# Patient Record
Sex: Male | Born: 1960 | Race: White | Hispanic: No | Smoking: Former smoker
Health system: Southern US, Community
[De-identification: ages and names within clinical notes are randomized; demographics above are authoritative.]

## PROBLEM LIST (undated history)

## (undated) DIAGNOSIS — Z87891 Personal history of nicotine dependence: Secondary | ICD-10-CM

## (undated) DIAGNOSIS — R7303 Prediabetes: Secondary | ICD-10-CM

## (undated) DIAGNOSIS — Z9289 Personal history of other medical treatment: Secondary | ICD-10-CM

## (undated) DIAGNOSIS — F32A Depression, unspecified: Secondary | ICD-10-CM

## (undated) DIAGNOSIS — Q825 Congenital non-neoplastic nevus: Secondary | ICD-10-CM

## (undated) DIAGNOSIS — I10 Essential (primary) hypertension: Secondary | ICD-10-CM

## (undated) DIAGNOSIS — I861 Scrotal varices: Secondary | ICD-10-CM

## (undated) DIAGNOSIS — H35719 Central serous chorioretinopathy, unspecified eye: Secondary | ICD-10-CM

## (undated) DIAGNOSIS — F329 Major depressive disorder, single episode, unspecified: Secondary | ICD-10-CM

## (undated) DIAGNOSIS — D649 Anemia, unspecified: Secondary | ICD-10-CM

## (undated) DIAGNOSIS — Z87442 Personal history of urinary calculi: Secondary | ICD-10-CM

## (undated) HISTORY — DX: Personal history of nicotine dependence: Z87.891

## (undated) HISTORY — DX: Congenital non-neoplastic nevus: Q82.5

## (undated) HISTORY — DX: Depression, unspecified: F32.A

## (undated) HISTORY — PX: COLONOSCOPY: SHX174

## (undated) HISTORY — PX: HERNIA REPAIR: SHX51

## (undated) HISTORY — DX: Central serous chorioretinopathy, unspecified eye: H35.719

## (undated) HISTORY — DX: Essential (primary) hypertension: I10

## (undated) HISTORY — DX: Major depressive disorder, single episode, unspecified: F32.9

## (undated) HISTORY — DX: Scrotal varices: I86.1

## (undated) HISTORY — PX: EXTRACORPOREAL SHOCK WAVE LITHOTRIPSY: SHX1557

---

## 2003-10-23 ENCOUNTER — Ambulatory Visit (HOSPITAL_COMMUNITY): Admission: RE | Admit: 2003-10-23 | Discharge: 2003-10-23 | Payer: Self-pay | Admitting: Urology

## 2004-10-03 HISTORY — PX: PALATE / UVULA BIOPSY / EXCISION: SUR128

## 2006-08-16 ENCOUNTER — Ambulatory Visit: Payer: Self-pay | Admitting: Family Medicine

## 2006-12-28 ENCOUNTER — Ambulatory Visit: Payer: Self-pay | Admitting: Family Medicine

## 2007-02-02 ENCOUNTER — Ambulatory Visit: Payer: Self-pay | Admitting: Family Medicine

## 2007-07-20 ENCOUNTER — Ambulatory Visit: Payer: Self-pay | Admitting: Family Medicine

## 2008-06-12 ENCOUNTER — Ambulatory Visit: Payer: Self-pay | Admitting: Family Medicine

## 2008-07-11 ENCOUNTER — Ambulatory Visit: Payer: Self-pay | Admitting: Family Medicine

## 2008-08-12 ENCOUNTER — Ambulatory Visit: Payer: Self-pay | Admitting: Family Medicine

## 2009-07-09 ENCOUNTER — Ambulatory Visit: Payer: Self-pay | Admitting: Family Medicine

## 2009-09-17 IMAGING — CR DG KNEE AP/LAT W/ SUNRISE*R*
3 series · 3 of 3 positions shown · non-contrast
Comparison: None

CLINICAL DATA: Knee pain, no injury.

DG KNEE - 3 VIEWS

[t knee ap right]
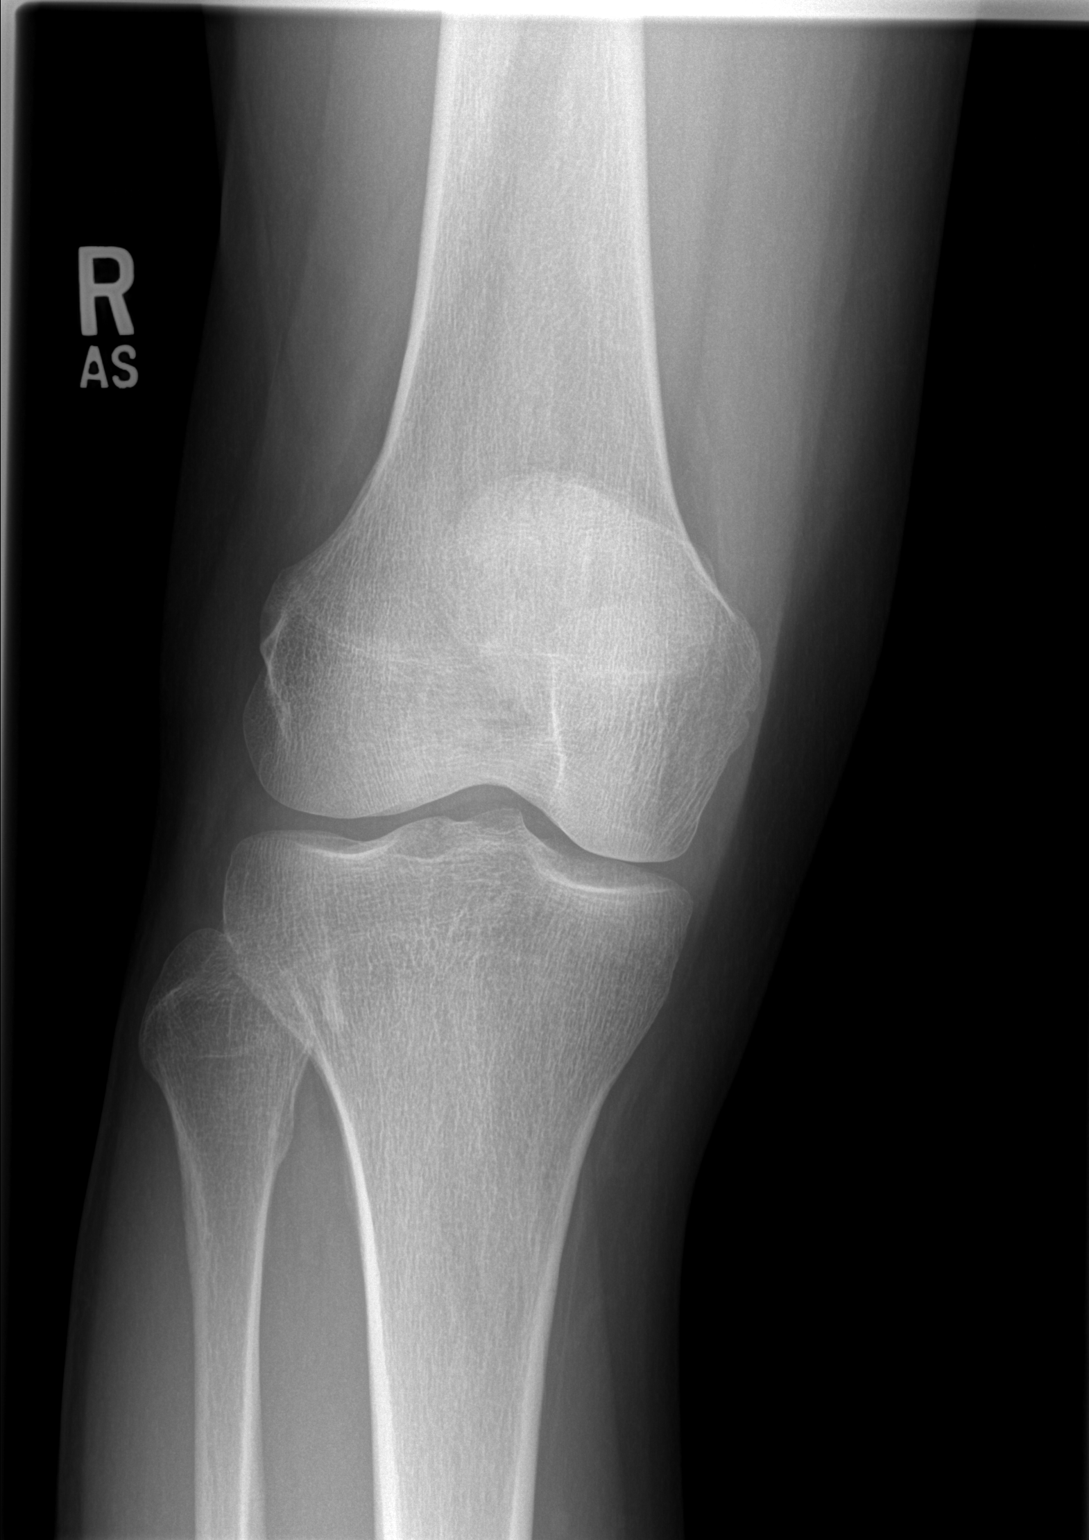

[t knee lat right]
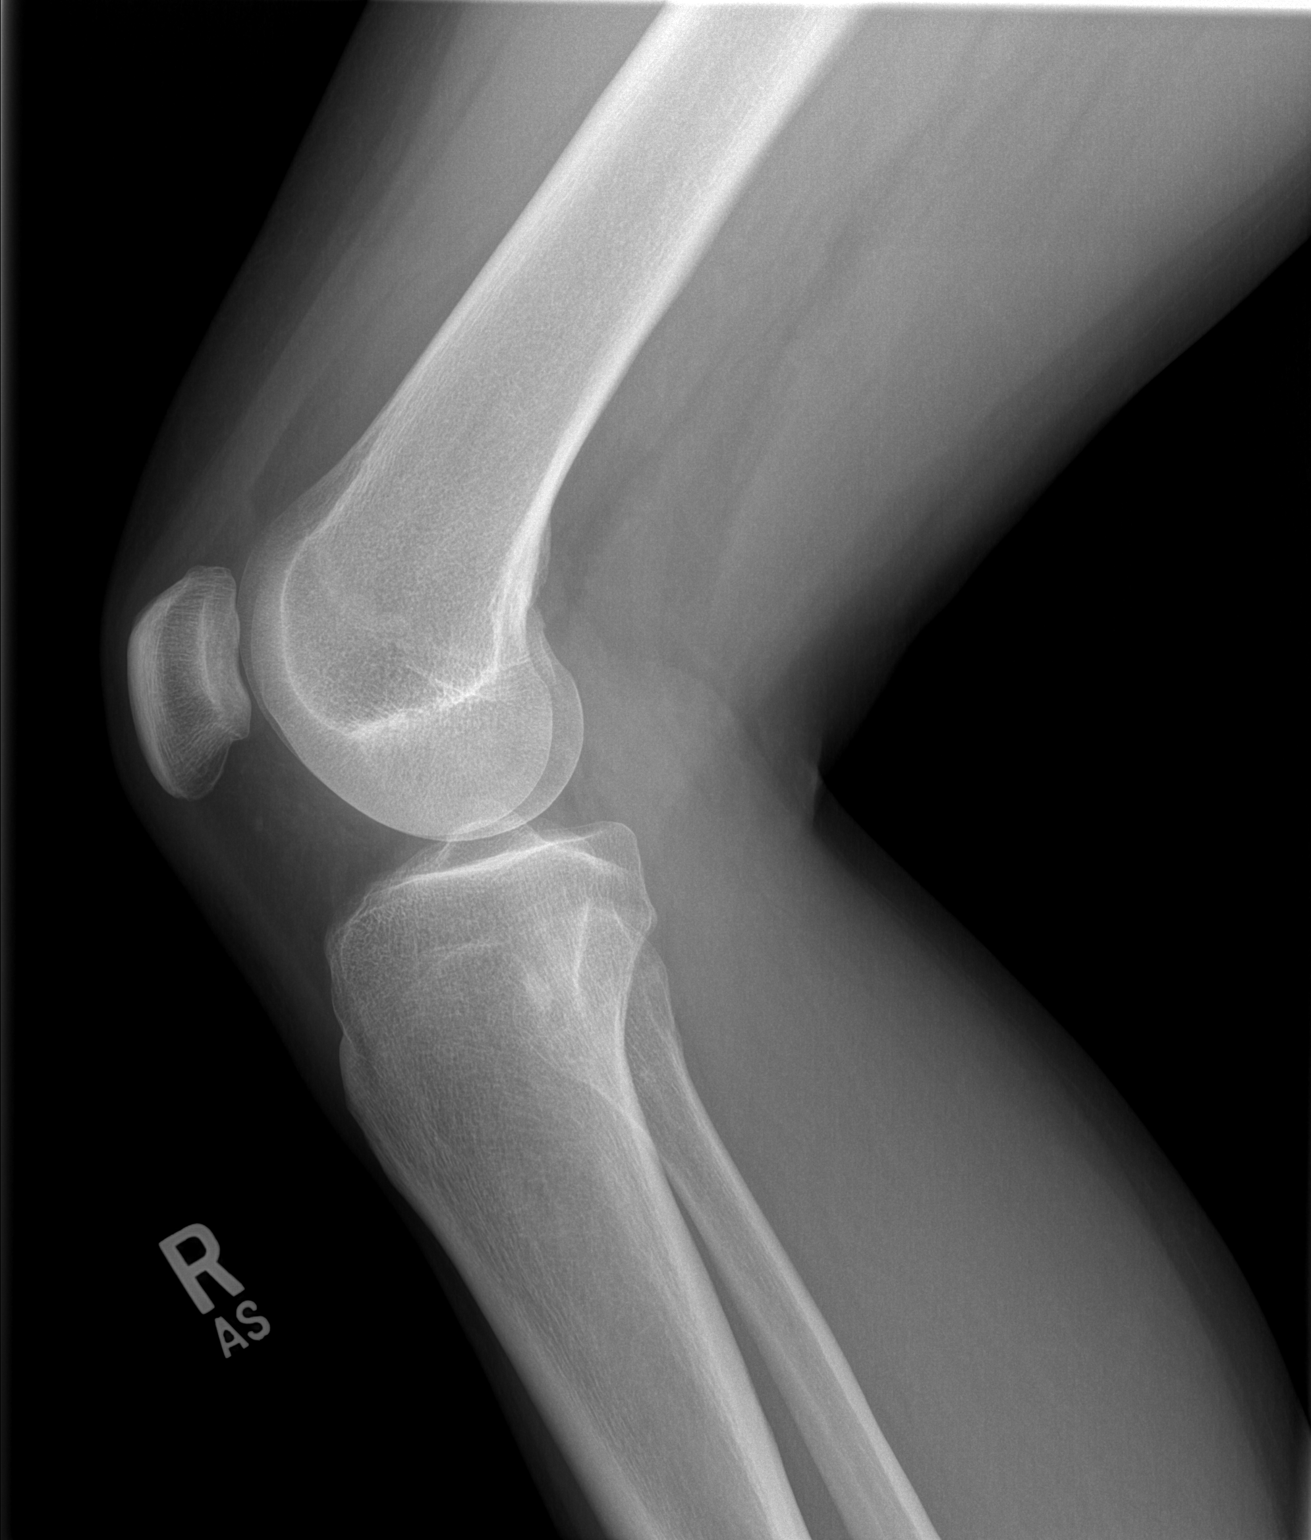

[view not recorded]
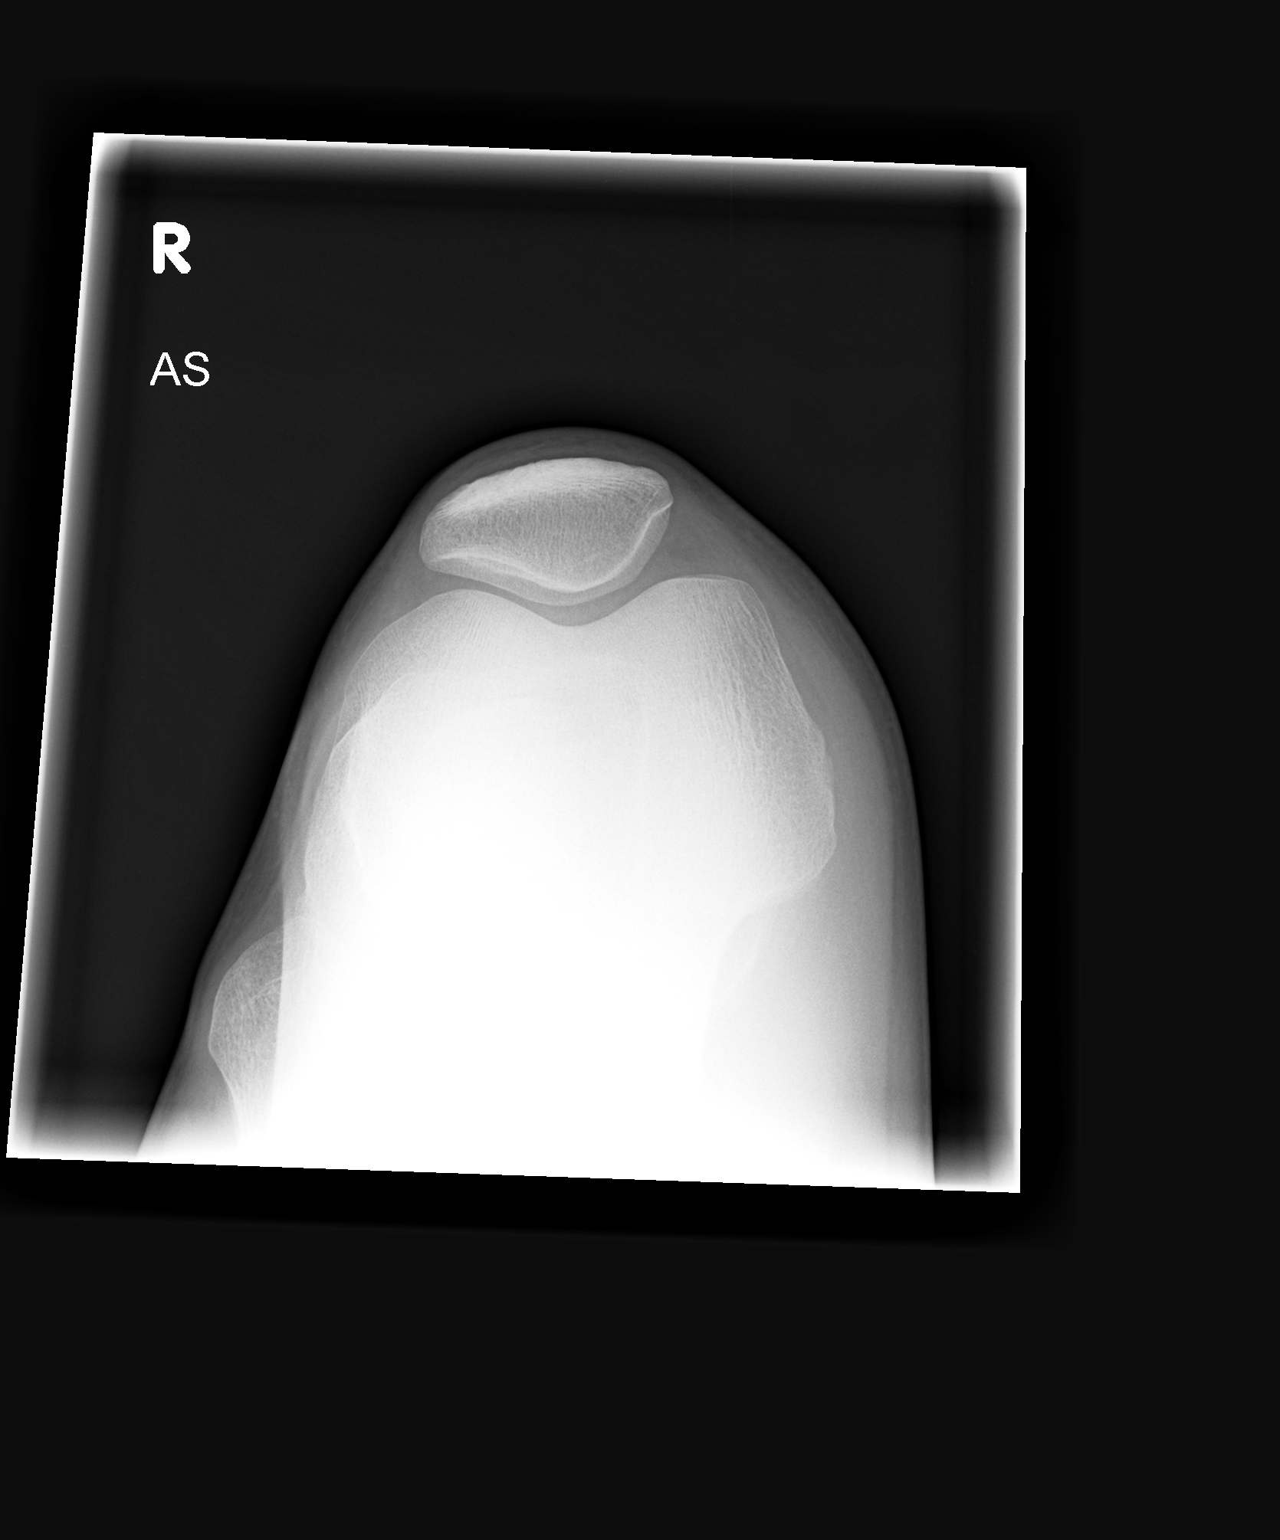

[3 of 3 positions shown; findings below may reference images not displayed]

FINDINGS: No acute bony abnormality.  Specifically, no fracture,
subluxation, or dislocation.  Soft tissues are intact. No joint
effusion.  Joint spaces maintained.
IMPRESSION: Unremarkable study.

## 2010-04-22 ENCOUNTER — Ambulatory Visit: Payer: Self-pay | Admitting: Family Medicine

## 2010-04-23 ENCOUNTER — Encounter: Admission: RE | Admit: 2010-04-23 | Discharge: 2010-04-23 | Payer: Self-pay | Admitting: Family Medicine

## 2010-04-23 IMAGING — CR DG ABDOMEN 1V
2 series · 2 of 2 positions shown · non-contrast
Comparison: None

CLINICAL DATA: Abdominal pain.

ABDOMEN - 1 VIEW

[t abdomen supine (1 of 2)]
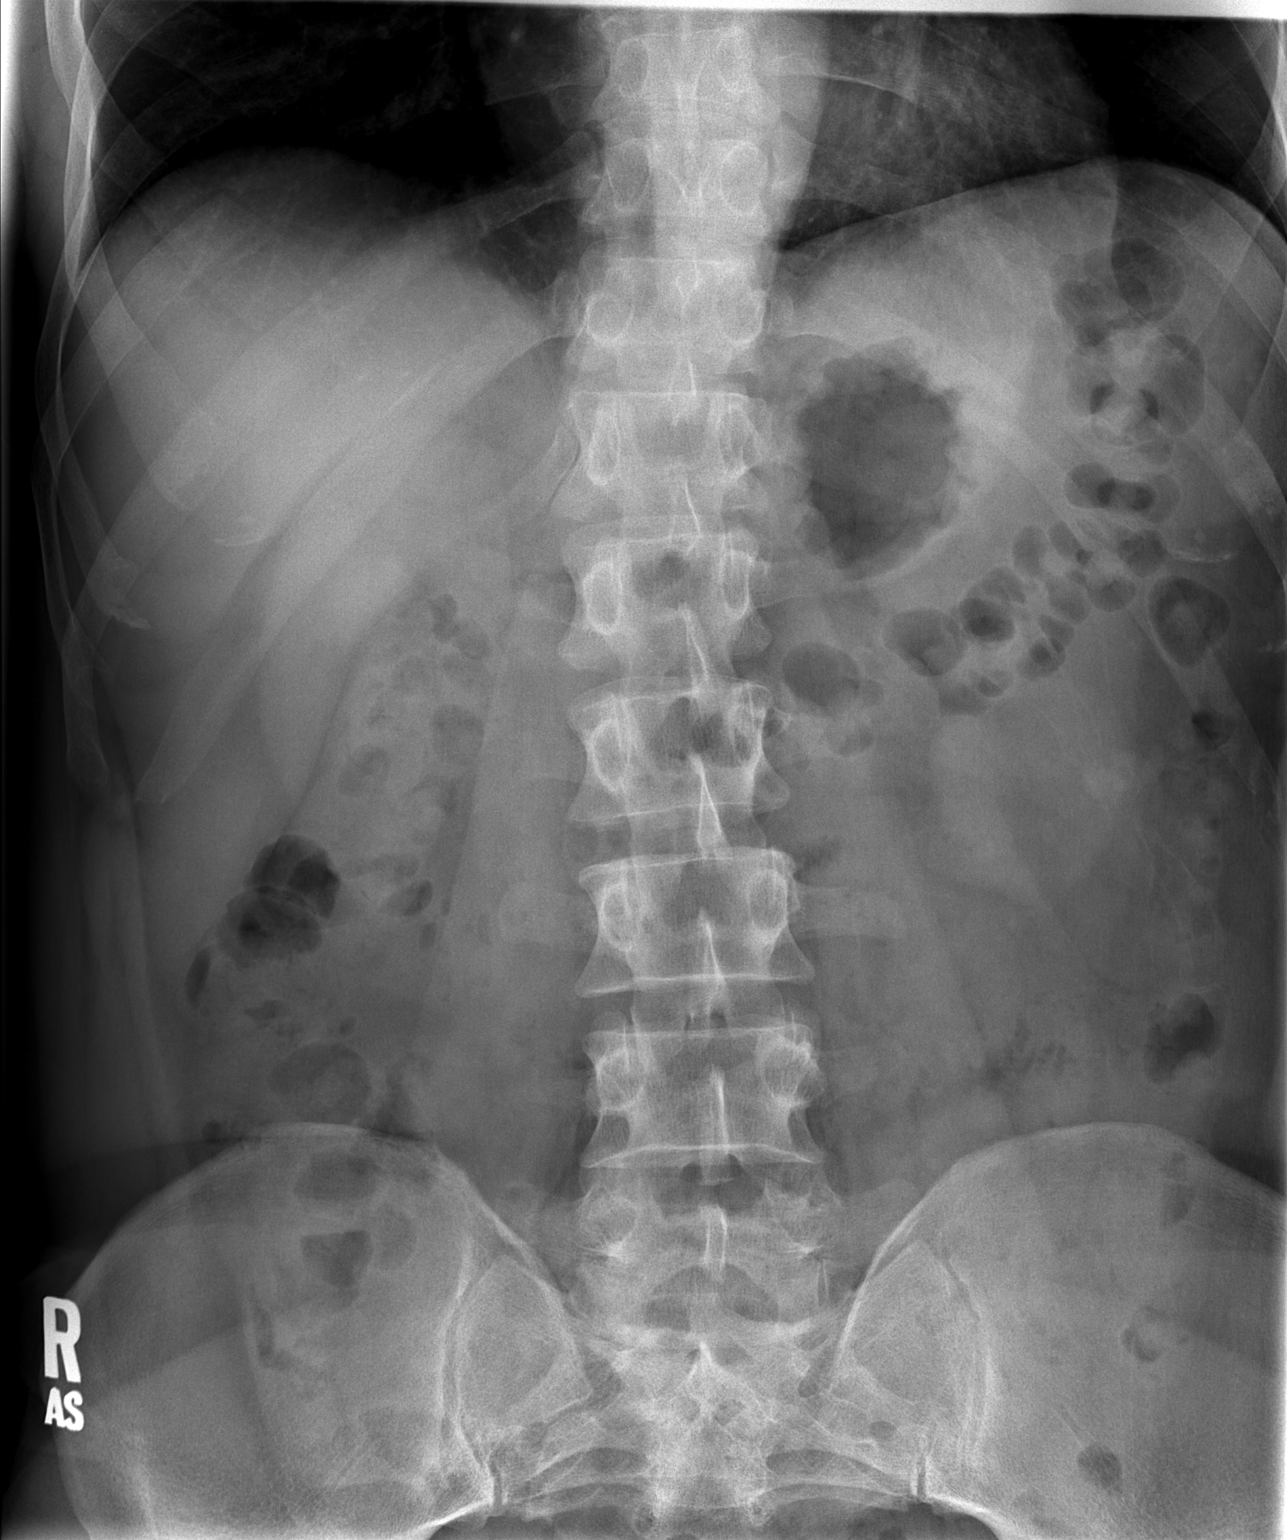

[t abdomen supine (2 of 2)]
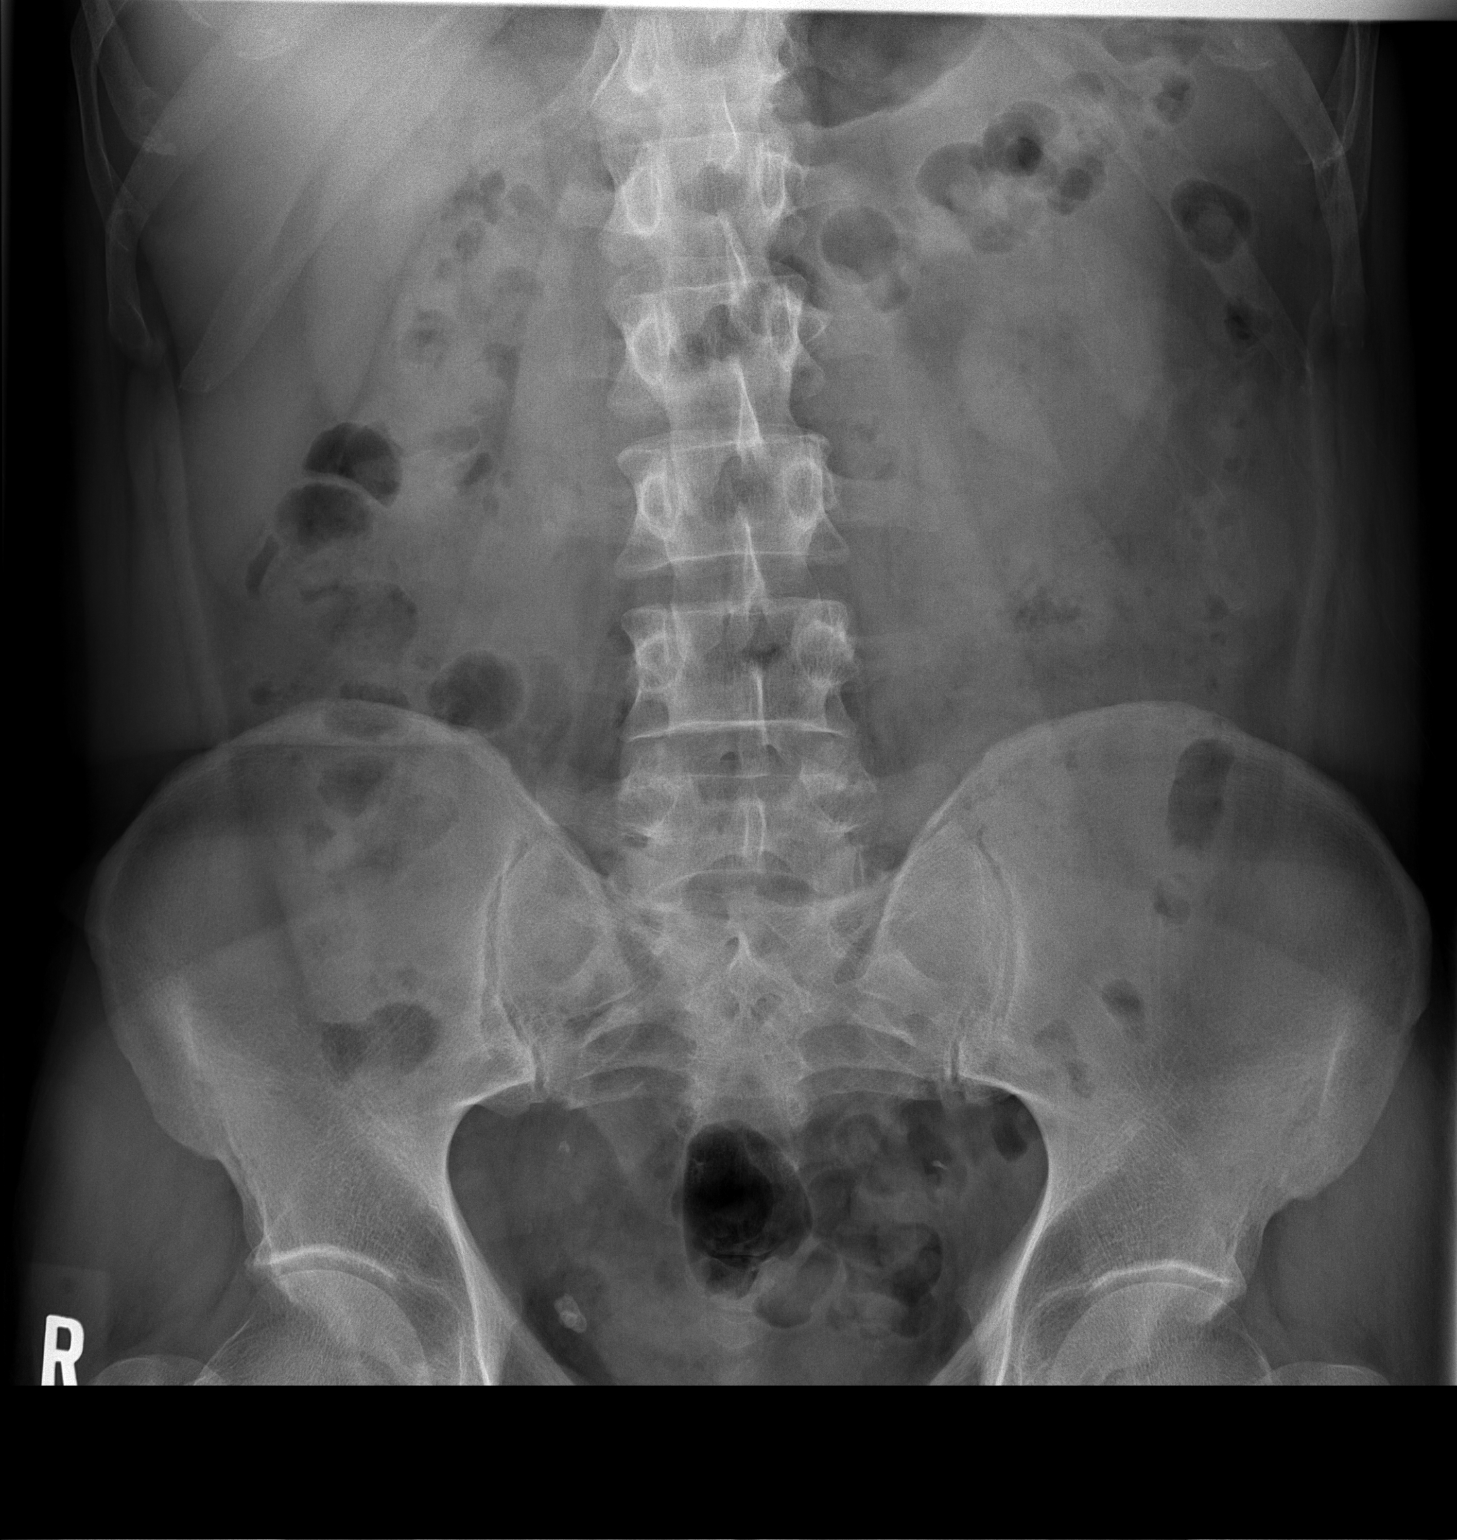

[2 of 2 positions shown; findings below may reference images not displayed]

FINDINGS: There is a normal bowel gas pattern.  No free air.  No
organomegaly or suspicious calcification.  Phleboliths seen in the
right side of the anatomic pelvis.  Visualized bony structures and
lung bases unremarkable.
IMPRESSION: No acute findings.

## 2010-04-23 IMAGING — US US ABDOMEN COMPLETE
1 series · 14 of 25 positions shown · non-contrast
Comparison: None

CLINICAL DATA: Left lower quadrant pain, nausea.

COMPLETE ABDOMINAL ULTRASOUND

[Series 1: us abdomen complete · 0.33mm/px · 14 of 70 slices shown]
[im 1/70]
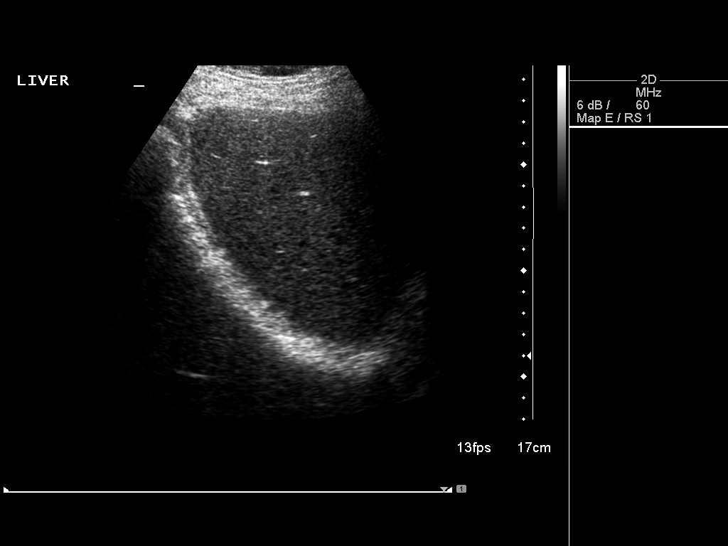
[im 6/70]
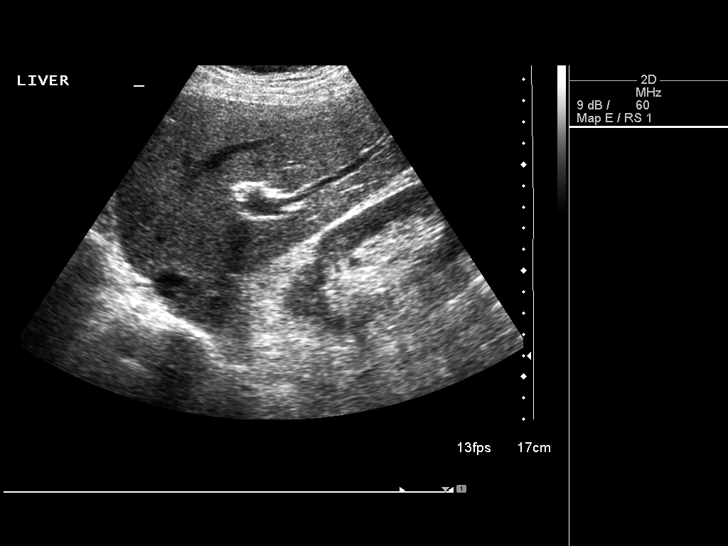
[im 12/70]
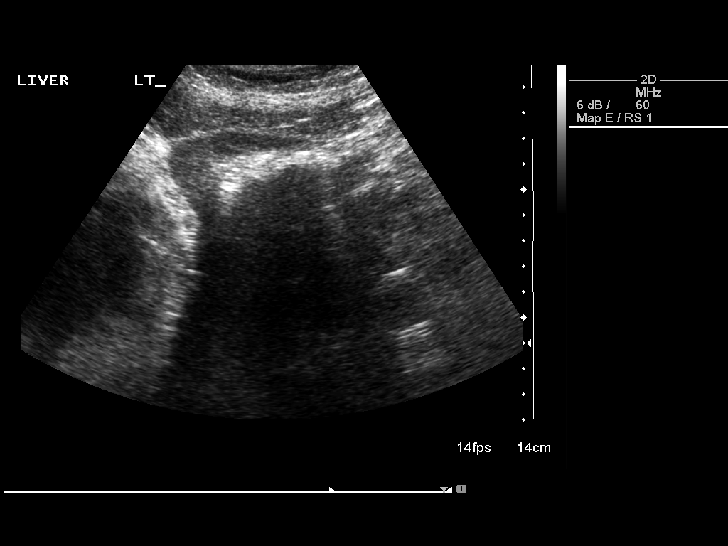
[im 18/70]
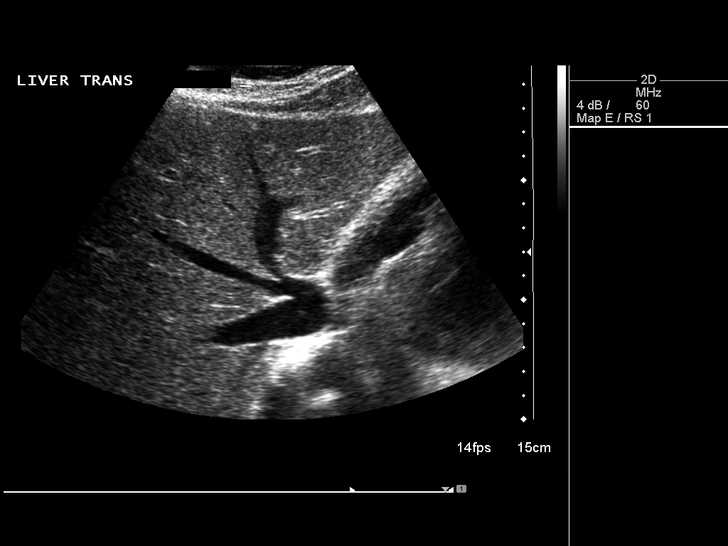
[im 24/70]
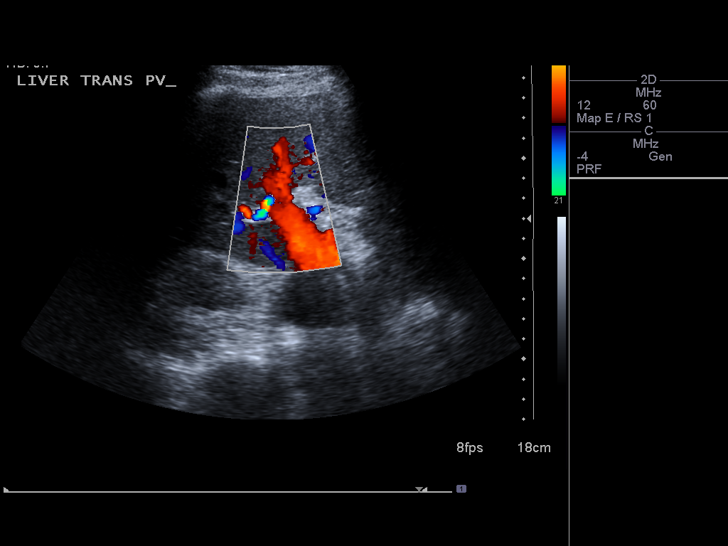
[im 26/70]
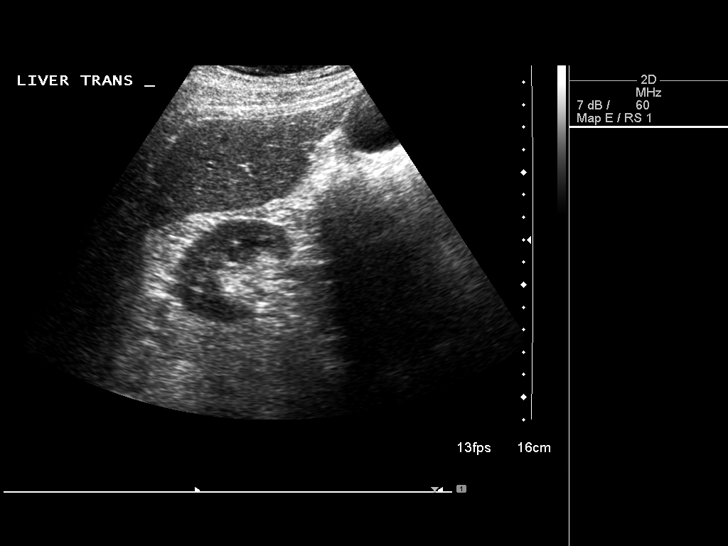
[im 32/70]
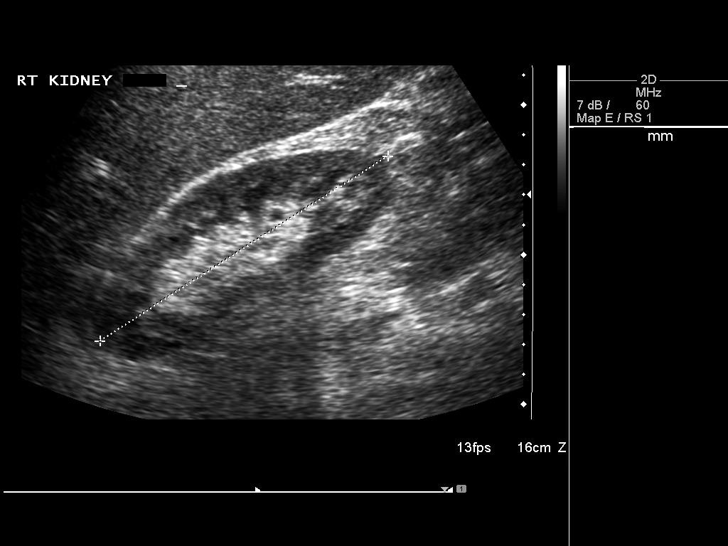
[im 38/70]
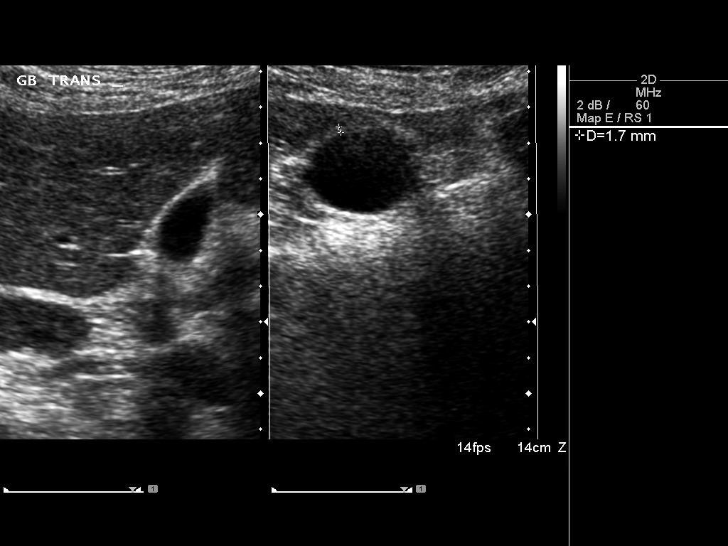
[im 44/70]
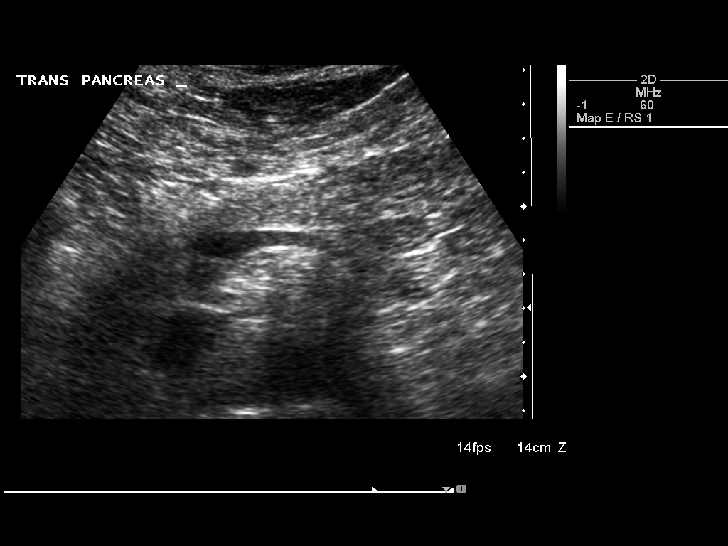
[im 47/70]
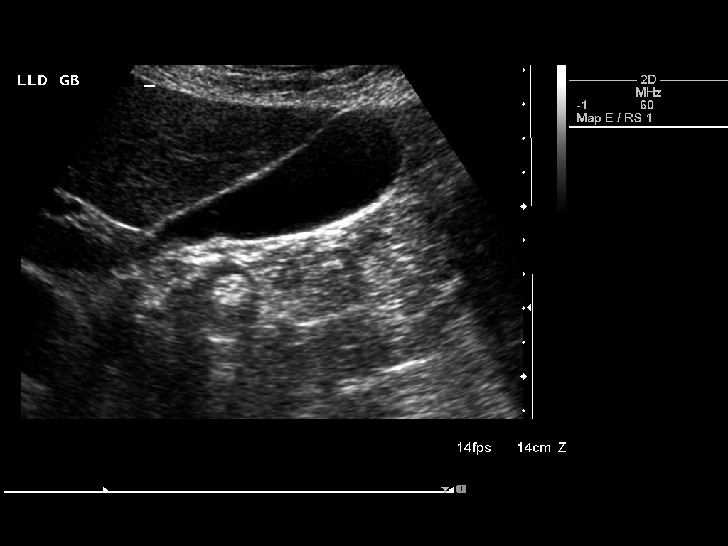
[im 52/70]
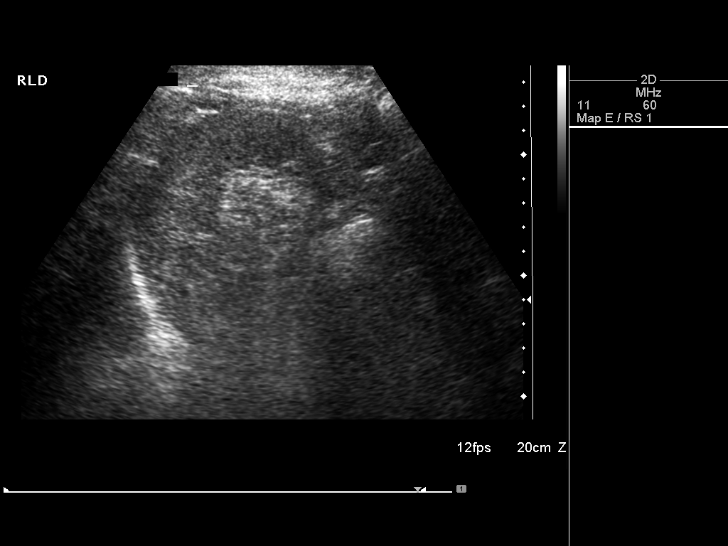
[im 58/70]
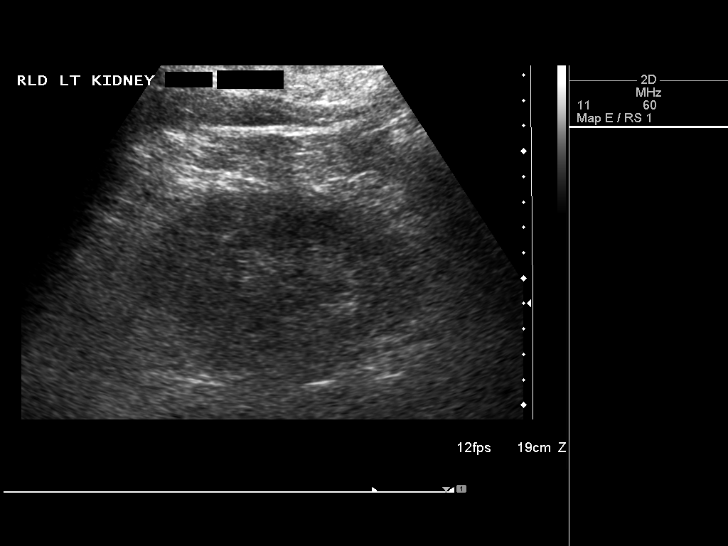
[im 64/70]
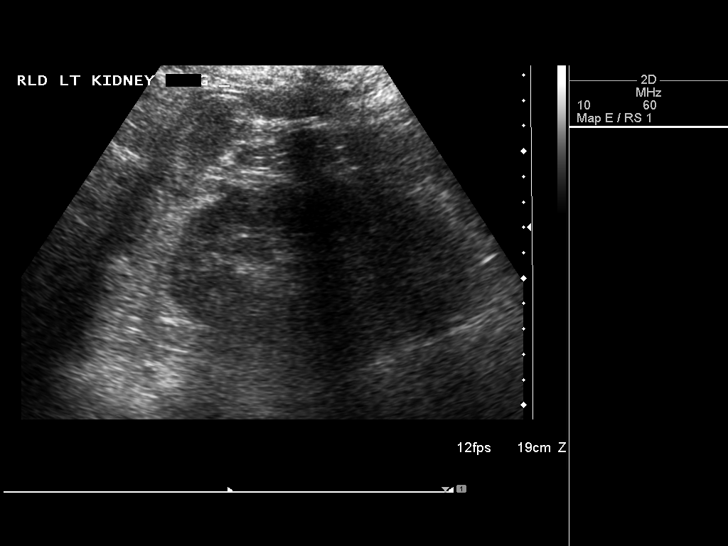
[im 70/70]
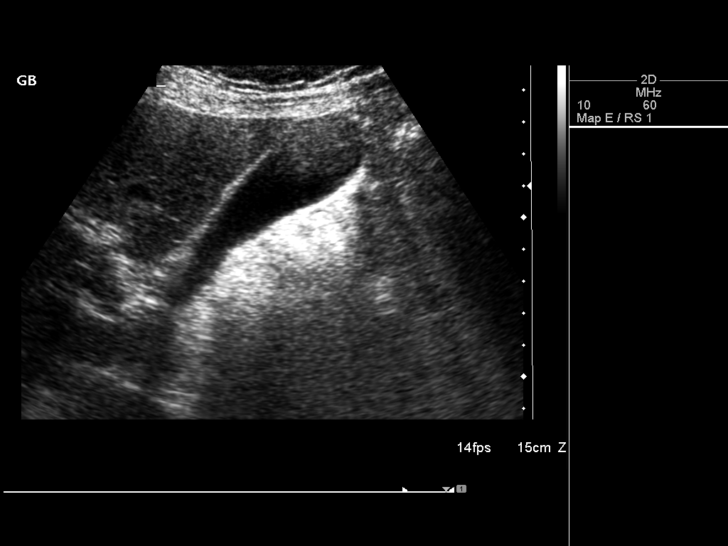

[14 of 25 positions shown; findings below may reference images not displayed]

FINDINGS: Gallbladder:  No gallstones, gallbladder wall thickening, or
pericholecystic fluid.

Common bile duct:   Within normal limits in caliber.

Liver:  No focal lesion identified.  Within normal limits in
parenchymal echogenicity.

IVC:  Appears normal.

Pancreas:  No focal abnormality seen.

Spleen:  Within normal limits in size and echotexture.

Right Kidney:   Normal in size and parenchymal echogenicity.  No
evidence of mass or hydronephrosis.

Left Kidney:  Normal in size and parenchymal echogenicity.  No
evidence of mass or hydronephrosis.

Abdominal aorta:  No aneurysm identified.
IMPRESSION: Negative abdominal ultrasound.

## 2010-05-24 ENCOUNTER — Ambulatory Visit: Payer: Self-pay | Admitting: Physician Assistant

## 2011-06-08 ENCOUNTER — Other Ambulatory Visit: Payer: Self-pay | Admitting: Family Medicine

## 2011-10-15 ENCOUNTER — Other Ambulatory Visit: Payer: Self-pay | Admitting: Family Medicine

## 2011-12-12 ENCOUNTER — Telehealth: Payer: Self-pay | Admitting: Family Medicine

## 2011-12-12 MED ORDER — LISINOPRIL 10 MG PO TABS: 10.0000 mg | ORAL_TABLET | Freq: Every day | ORAL | Status: DC

## 2011-12-12 NOTE — Telephone Encounter (Signed)
Pt was called has apt Friday pt has not been here seance 2011 sent in only 30 days no more refill till pt has med check

## 2011-12-12 NOTE — Telephone Encounter (Signed)
Denied.

## 2011-12-16 ENCOUNTER — Encounter: Payer: Self-pay | Admitting: Medical

## 2011-12-16 ENCOUNTER — Ambulatory Visit (INDEPENDENT_AMBULATORY_CARE_PROVIDER_SITE_OTHER): Payer: Managed Care, Other (non HMO) | Admitting: Medical

## 2011-12-16 ENCOUNTER — Other Ambulatory Visit: Payer: Self-pay | Admitting: Medical

## 2011-12-16 VITALS — BP 122/80 | HR 78 | Temp 98.1°F | Resp 16 | Wt 198.0 lb

## 2011-12-16 DIAGNOSIS — F329 Major depressive disorder, single episode, unspecified: Secondary | ICD-10-CM | POA: Insufficient documentation

## 2011-12-16 DIAGNOSIS — M25562 Pain in left knee: Secondary | ICD-10-CM

## 2011-12-16 DIAGNOSIS — F325 Major depressive disorder, single episode, in full remission: Secondary | ICD-10-CM | POA: Insufficient documentation

## 2011-12-16 DIAGNOSIS — M25569 Pain in unspecified knee: Secondary | ICD-10-CM

## 2011-12-16 DIAGNOSIS — I10 Essential (primary) hypertension: Secondary | ICD-10-CM

## 2011-12-16 LAB — COMPREHENSIVE METABOLIC PANEL
ALT: 21 U/L (ref 0–53)
AST: 20 U/L (ref 0–37)
Albumin: 4.8 g/dL (ref 3.5–5.2)
CO2: 24 mEq/L (ref 19–32)
Calcium: 9.8 mg/dL (ref 8.4–10.5)
Glucose, Bld: 110 mg/dL — ABNORMAL HIGH (ref 70–99)
Sodium: 138 mEq/L (ref 135–145)

## 2011-12-16 LAB — CBC WITH DIFFERENTIAL/PLATELET
Basophils Relative: 1 % (ref 0–1)
HCT: 45.7 % (ref 39.0–52.0)
Hemoglobin: 15.8 g/dL (ref 13.0–17.0)
Lymphocytes Relative: 22 % (ref 12–46)
MCHC: 34.6 g/dL (ref 30.0–36.0)
MCV: 92.3 fL (ref 78.0–100.0)
Monocytes Absolute: 0.6 10*3/uL (ref 0.1–1.0)
Monocytes Relative: 13 % — ABNORMAL HIGH (ref 3–12)
Neutrophils Relative %: 61 % (ref 43–77)
RDW: 12.1 % (ref 11.5–15.5)
WBC: 5 10*3/uL (ref 4.0–10.5)

## 2011-12-16 LAB — LIPID PANEL
Cholesterol: 210 mg/dL — ABNORMAL HIGH (ref 0–200)
HDL: 75 mg/dL (ref >39)
Triglycerides: 119 mg/dL (ref <150)
VLDL: 24 mg/dL (ref 0–40)

## 2011-12-16 MED ORDER — LISINOPRIL 10 MG PO TABS: 10.0000 mg | ORAL_TABLET | Freq: Every day | ORAL | Status: DC

## 2011-12-16 MED ORDER — SERTRALINE HCL 100 MG PO TABS: 100.0000 mg | ORAL_TABLET | Freq: Every day | ORAL | Status: DC

## 2011-12-16 NOTE — Progress Notes (Signed)
Subjective: Here for recheck.   Hasn't been here in over a year.  Here for recheck on blood pressure, needs refill on medication   Lately when he checks his BP at the drug store, gets higher readings at times.    He is currently a full time student, full time employed.  Works as an Retail banker, and in school for music degree.  Playing classical music, and in recording and music performance program.    Lately having problems with knees.  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  Since then has had some left knee pain intermittent. The swelling was only the day he was on his knees for extended period.    He is doing well on Zoloft, no c/o.    Past Medical History  Diagnosis Date  . Hypertension   . Renal stones   . Depression   . Left varicocele    ROS General: No fever, chills, sweats Heart: No chest pain or palpitations, no swelling Lungs: No shortness of breath wheezing GI negative GU negative Neuro: No numbness, tingling, weakness     Objective:   Physical Exam  Filed Vitals:   12/16/11 1418  BP: 122/80  Pulse: 78  Temp: 98.1 F (36.7 C)  Resp: 16    General appearance: alert, no distress, WD/WN Oral cavity: MMM, no lesions Neck: supple, no lymphadenopathy, no thyromegaly, no masses Heart: RRR, normal S1, S2, no murmurs Lungs: CTA bilaterally, no wheezes, rhonchi, or rales Abdomen: +bs, soft, non tender, non distended, no masses, no hepatomegaly, no splenomegaly MSK: Mild tenderness of left lateral knee joint line, slight tenderness with McMurray's on the left, otherwise bilateral lower extremity exam nontender, no obvious joint laxity, no swelling, no ecchymosis, unremarkable Pulses: 2+ symmetric Neuro: Normal strength and sensation of legs  Assessment and Plan :    Encounter Diagnoses  Name Primary?  . Essential hypertension, benign Yes  . Depression   . Knee pain, left    HTN - controlled  on current medication.  Refills today. Labs today.  Depression-doing well on Zoloft, refilled today  Knee pain- etiology seems to be inflammatory versus other.  Exam today does not suggest a tear.  We discussed ice, elevation, Aleve for pain and inflammation, and I demonstrated some strengthening and stretching exercises for him to use.   Recheck in 3-4 weeks if not improving.  Follow-up pending labs.

## 2011-12-16 NOTE — Progress Notes (Signed)
Addended by: Jac Canavan on: 12/16/2011 03:11 PM   Modules accepted: Orders

## 2011-12-16 NOTE — Patient Instructions (Signed)
1) Knee pain -    Ice pack to the knees once to twice daily for the next 1-2 weeks  You can continue using your knee sleeve for now  Avoid lots of twisting motion for now  Take Aleve OTC once to twice daily for the next 1-2 weeks  Try the daily stretching and strengthening techniques I showed you today  If not improving, let me know  2) I refilled your medications today  3) we are checking your labs today.  We will call Monday with lab results.

## 2011-12-19 LAB — HEMOGLOBIN A1C
Hgb A1c MFr Bld: 5.8 % — ABNORMAL HIGH (ref <5.7)
Mean Plasma Glucose: 120 mg/dL — ABNORMAL HIGH (ref <117)

## 2012-02-08 ENCOUNTER — Encounter: Payer: Self-pay | Admitting: Medical

## 2012-02-08 ENCOUNTER — Ambulatory Visit (INDEPENDENT_AMBULATORY_CARE_PROVIDER_SITE_OTHER): Payer: Managed Care, Other (non HMO) | Admitting: Medical

## 2012-02-08 VITALS — BP 150/110 | HR 88 | Temp 98.5°F | Resp 16 | Wt 199.0 lb

## 2012-02-08 DIAGNOSIS — R361 Hematospermia: Secondary | ICD-10-CM

## 2012-02-08 LAB — POCT URINALYSIS DIPSTICK
Blood, UA: NEGATIVE
Ketones, UA: NEGATIVE
Leukocytes, UA: NEGATIVE
Protein, UA: NEGATIVE
Urobilinogen, UA: NEGATIVE

## 2012-02-08 MED ORDER — SULFAMETHOXAZOLE-TRIMETHOPRIM 800-160 MG PO TABS: 1.0000 | ORAL_TABLET | Freq: Two times a day (BID) | ORAL | Status: AC

## 2012-02-08 NOTE — Progress Notes (Signed)
Subjective: Here for c/o blood in semen.  He came home from work last Thursday, noticed redness on tissue and a piece of orange pulp like material. Since then he has been seeing blood mixed in semen and sometimes in the urine.  He notes remote hx/o prostatitis once, notes hx/o kidney stones too. He has had some mild low left back pain.  He reports seeing blood in semen a few times months ago but this cleared.  It has went from pinkish coloration to more red blood in semen.  He is currently not sexually active.  Is separate from ex-wife.  No concern for STD.    Past Medical History  Diagnosis Date  . Hypertension   . Renal stones   . Depression   . Left varicocele    ROS Gen: no fever, chills, weight changes GI: no pain, NVD, no constipation, blood in stool GU: no burning, frequency, urgency, no nocturia, no urine stream changes Neuro: negative    Objective:   Physical Exam  Filed Vitals:   02/08/12 1147  BP: 150/110  Pulse: 88  Temp: 98.5 F (36.9 C)  Resp: 16    General appearance: alert, no distress, WD/WN Lungs: CTA bilaterally, no wheezes, rhonchi, or rales Back: no CVA tenderness Abdomen: +bs, soft, non tender, non distended, no masses, no hepatomegaly, no splenomegaly GU: normal male external genitalia, no mass, no hernia Rectal: anus normal, prostate not particularly tender or enlarged, no nodules  Assessment and Plan :    Encounter Diagnosis  Name Primary?  . Hematospermia Yes   Discussed possible causes, reviewed normal Urinalysis today.  Will check PSA and send urine for culture.  He declines STD screening and no concern for this. Will treat empirically with Bactrim x 14 days for possible prostatitis.  Follow-up 3wk, sooner prn.

## 2012-02-10 NOTE — Progress Notes (Signed)
Pt called advised of lab results, he will rechk in 2 - 3 weeks

## 2012-02-29 ENCOUNTER — Ambulatory Visit: Payer: Managed Care, Other (non HMO) | Admitting: Medical

## 2012-07-17 ENCOUNTER — Other Ambulatory Visit: Payer: Self-pay | Admitting: Medical

## 2012-07-17 NOTE — Telephone Encounter (Signed)
RX refill on Zoloft.

## 2012-07-26 ENCOUNTER — Telehealth: Payer: Self-pay | Admitting: Family Medicine

## 2012-07-26 NOTE — Telephone Encounter (Signed)
DISREGARD- MEDICATION ALREADY FILLED

## 2012-09-18 ENCOUNTER — Ambulatory Visit (INDEPENDENT_AMBULATORY_CARE_PROVIDER_SITE_OTHER): Payer: Managed Care, Other (non HMO) | Admitting: Medical

## 2012-09-18 ENCOUNTER — Encounter: Payer: Self-pay | Admitting: Medical

## 2012-09-18 VITALS — BP 170/100 | HR 72 | Temp 98.2°F | Resp 16 | Wt 207.0 lb

## 2012-09-18 DIAGNOSIS — R361 Hematospermia: Secondary | ICD-10-CM

## 2012-09-18 DIAGNOSIS — I10 Essential (primary) hypertension: Secondary | ICD-10-CM

## 2012-09-18 DIAGNOSIS — J111 Influenza due to unidentified influenza virus with other respiratory manifestations: Secondary | ICD-10-CM

## 2012-09-18 LAB — POCT INFLUENZA A/B: Influenza B, POC: NEGATIVE

## 2012-09-18 MED ORDER — OSELTAMIVIR PHOSPHATE 75 MG PO CAPS: 75.0000 mg | ORAL_CAPSULE | Freq: Two times a day (BID) | ORAL | Status: DC

## 2012-09-18 NOTE — Progress Notes (Signed)
Subjective:  Jeffery [name] is a 51 y.o. male who presents for possible sinus infection.   He notes starting to feel a little off on Sunday, but abruptly yesterday began having head congestion, cough, sore throat, chills, aches, nausea, runny nose, sneezing, some productive sputum.  Son has been sick too last week.  Using nothing for symptoms.  He did not get the flu shot this year.    He has not taken his BP medication today.  After last visit the antibiotic cleared up the blood in the sperm.  It happened one other time a few weeks later.  Both times seemed to occur after taking a bath which he usually doesn't do.  Otherwise no blood seen in urine or semen.   Past Medical History  Diagnosis Date  . Hypertension   . Renal stones   . Depression   . Left varicocele    ROS as in HPI    Objective:      General: Ill-appearing, well-developed, well-nourished Skin: Hot, dry HEENT: Nose inflamed and congested, clear conjunctiva, TMs pearly, no sinus tenderness, pharynx with erythema, no exudates Neck: Supple, nontender, shotty cervical adenopathy Heart: Regular rate and rhythm, normal S1, S2, no murmurs Lungs: Clear to auscultation bilaterally, no wheezes, rales, rhonchi Abdomen: Nontender non distended Extremities: Mild generalized tenderness      Assessment and Plan:   Encounter Diagnoses  Name Primary?  . Influenza Yes  . Essential hypertension, benign   . Hematospermia    Discussed diagnosis of influenza.  prescription given for Tamiflu, discussed risks/benefits of medication.  Discussed supportive care including rest, hydration, OTC Tylenol or NSAID for fever, aches, and malaise.  Discussed period of contagion, self quarantine at home away from others to avoid spread of disease, discussed means of transmission, and possible complications including pneumonia.  If worse or not improving within the next 4-5 days, then call or return.  HTN - c/t current medication, don't miss  doses, f/u soon for recheck on HTN and physical  Hematospermia - f/u soon for physical  Gave note for work.

## 2012-10-05 DIAGNOSIS — Z0279 Encounter for issue of other medical certificate: Secondary | ICD-10-CM

## 2012-10-31 ENCOUNTER — Other Ambulatory Visit: Payer: Self-pay | Admitting: Medical

## 2012-11-22 ENCOUNTER — Other Ambulatory Visit: Payer: Self-pay | Admitting: Medical

## 2012-11-23 ENCOUNTER — Other Ambulatory Visit: Payer: Self-pay | Admitting: Medical

## 2012-11-23 MED ORDER — SERTRALINE HCL 100 MG PO TABS: 100.0000 mg | ORAL_TABLET | Freq: Every day | ORAL | Status: DC

## 2012-11-23 NOTE — Telephone Encounter (Signed)
Is this okay?

## 2012-11-23 NOTE — Telephone Encounter (Signed)
Med sent, but go ahead and schedule f/u

## 2012-11-26 ENCOUNTER — Other Ambulatory Visit: Payer: Self-pay | Admitting: Medical

## 2012-11-26 NOTE — Telephone Encounter (Signed)
Patient is aware and he scheduled his appointment for 12/24/12 for a physical. CLS

## 2012-11-26 NOTE — Telephone Encounter (Signed)
i sent med refill, but he is due for physical I believe.  pls set up appt

## 2012-11-26 NOTE — Telephone Encounter (Signed)
Is this okay to refill? 

## 2012-12-13 ENCOUNTER — Encounter: Payer: Self-pay | Admitting: Internal Medicine

## 2012-12-24 ENCOUNTER — Encounter: Payer: Managed Care, Other (non HMO) | Admitting: Medical

## 2013-01-14 ENCOUNTER — Encounter: Payer: Self-pay | Admitting: Medical

## 2013-01-14 ENCOUNTER — Ambulatory Visit (INDEPENDENT_AMBULATORY_CARE_PROVIDER_SITE_OTHER): Payer: Managed Care, Other (non HMO) | Admitting: Medical

## 2013-01-14 VITALS — BP 148/100 | HR 92 | Temp 97.7°F | Resp 16 | Ht 75.0 in | Wt 212.0 lb

## 2013-01-14 DIAGNOSIS — I10 Essential (primary) hypertension: Secondary | ICD-10-CM

## 2013-01-14 DIAGNOSIS — F329 Major depressive disorder, single episode, unspecified: Secondary | ICD-10-CM

## 2013-01-14 DIAGNOSIS — Z23 Encounter for immunization: Secondary | ICD-10-CM

## 2013-01-14 DIAGNOSIS — F32A Depression, unspecified: Secondary | ICD-10-CM

## 2013-01-14 DIAGNOSIS — Z1211 Encounter for screening for malignant neoplasm of colon: Secondary | ICD-10-CM

## 2013-01-14 DIAGNOSIS — R7301 Impaired fasting glucose: Secondary | ICD-10-CM

## 2013-01-14 DIAGNOSIS — Z0000 Encounter for general adult medical examination without abnormal findings: Secondary | ICD-10-CM

## 2013-01-14 DIAGNOSIS — F1010 Alcohol abuse, uncomplicated: Secondary | ICD-10-CM

## 2013-01-14 LAB — POCT URINALYSIS DIPSTICK
Blood, UA: NEGATIVE
Protein, UA: NEGATIVE
Spec Grav, UA: <=1.005
pH, UA: 6

## 2013-01-14 MED ORDER — LISINOPRIL-HYDROCHLOROTHIAZIDE 20-12.5 MG PO TABS: 1.0000 | ORAL_TABLET | Freq: Every day | ORAL | Status: DC

## 2013-01-14 NOTE — Progress Notes (Addendum)
Subjective:   HPI  Jeffery [name] is a 52 y.o. male who presents for a complete physical.  Preventative care: Last ophthalmology visit:n/a Last dental visit:yes-unsure of name Last colonoscopy:n/a Last prostate exam: 02/2012 Last ZOX:WRUEA Last labs:02/2012  Prior vaccinations: TD or Tdap:1994- no to vaccine up date  Influenza:n/a Pneumococcal:n/a Shingles/Zostavax:n/a  Advanced directive:n/a Health care power of attorney:n/a Living will:n/a  Concerns: HTN - lately BPs have all been running high.  Compliant with medication.  Drinks excessively.  He attributes his evening drinking to helping take the edge off.  Stressores include divorced in the past year or so, no significant other currently, not happy with his job, worries about the state of the world, problems on the global scale, our [place] economy, thinks in general.   If he didn't drink, he would probably "smoke weed."  He doesn't enjoy drinking, but it has become a habit.  Wants to quit, but not sure what to do.  No thoughts of HI/SI.  No prior psychiatric care.    Reviewed their medical, surgical, family, social, medication, and allergy history and updated chart as appropriate.   Past Medical History  Diagnosis Date  . Hypertension   . Renal stones   . Depression   . Left varicocele   . Central serous retinopathy     hx/o intraocular injection therapy for 3 years; no change after 3 years of therapy, no visual c/o  . Former smoker   . Port-wine stain of face      Past Surgical History  Procedure Laterality Date  . Polyp on uvula  2006    excision;   . Colonoscopy      never    Family History  Problem Relation Age of Onset  . Heart disease Mother     pacemaker  . Heart disease Father 72    died of MI, 70s  . Diabetes Father   . Stroke Father   . Migraines Sister   . Mental illness Sister   . Cancer Neg Hx   . Hyperlipidemia Neg Hx   . Hypertension Neg Hx     History   Social History  . Marital  Status: Married    Spouse Name: N/A    Number of Children: N/A  . Years of Education: N/A   Occupational History  . Not on file.   Social History Main Topics  . Smoking status: Never Smoker   . Smokeless tobacco: Not on file  . Alcohol Use: 18.0 oz/week    30 Cans of beer per week  . Drug Use: No  . Sexually Active: Not on file   Other Topics Concern  . Not on file   Social History Narrative   Divorced 04/2012, 2 children ages daughter 53yo, son 48yo; exercise - moving on the job, walking, works as Retail banker    Current Outpatient Prescriptions on File Prior to Visit  Medication Sig Dispense Refill  . sertraline (ZOLOFT) 100 MG tablet Take 1 tablet (100 mg total) by mouth daily.  90 tablet  0   No current facility-administered medications on file prior to visit.    Allergies  Allergen Reactions  . Penicillins      Review of Systems Constitutional: -fever, -chills, -sweats, -unexpected weight change, -decreased appetite, +fatigue Allergy: -sneezing, -itching, -congestion Dermatology: -changing moles, --rash, -lumps ENT: -runny nose, -ear pain, -sore throat, -hoarseness, -sinus pain, -teeth pain, - ringing in ears, -hearing loss, -nosebleeds Cardiology: -chest pain, -palpitations, -swelling, -difficulty breathing when lying  flat, -waking up short of breath Respiratory: -cough, -shortness of breath, -difficulty breathing with exercise or exertion, -wheezing, -coughing up blood Gastroenterology: -abdominal pain, -nausea, -vomiting, +diarrhea, -constipation, -blood in stool, -changes in bowel movement, -difficulty swallowing or eating Hematology: -bleeding, -bruising  Musculoskeletal: +joint aches, -muscle aches, -joint swelling, -back pain, -neck pain, -cramping, -changes in gait Ophthalmology: + vision changes, eye redness, itching, discharge Urology: -burning with urination, -difficulty urinating, -blood in urine, -urinary frequency, -urgency,  +incontinence Neurology: -headache, -weakness, -tingling, -numbness, -memory loss, -falls, -dizziness Psychology: +depressed mood, +agitation, -sleep problems     Objective:   Physical Exam  Filed Vitals:   01/14/13 0947  BP: 148/100  Pulse: 92  Temp: 97.7 F (36.5 C)    General appearance: alert, no distress, WD/WN, white male Skin: right ear, neck, face, upper shoulder with large area of purplish coloration c/w port wine stain, he reports unchanged since birth, scattered macules on back and torso, no specific worrisome lesions HEENT: normocephalic, conjunctiva/corneas normal, sclerae anicteric, PERRLA, EOMi, nares patent, no discharge or erythema, pharynx normal Oral cavity: MMM, tongue normal, teeth in good repair Neck: supple, no lymphadenopathy, no thyromegaly, no masses, normal ROM, no bruits Chest: non tender, normal shape and expansion Heart: RRR, normal S1, S2, no murmurs Lungs: CTA bilaterally, no wheezes, rhonchi, or rales Abdomen: +bs, soft, non tender, non distended, no masses, no hepatomegaly, no splenomegaly, no bruits Back: non tender, normal ROM, no scoliosis Musculoskeletal: upper extremities non tender, no obvious deformity, normal ROM throughout, lower extremities non tender, no obvious deformity, normal ROM throughout Extremities: no edema, no cyanosis, no clubbing Pulses: 2+ symmetric, upper and lower extremities, normal cap refill Neurological: alert, oriented x 3, CN2-12 intact, strength normal upper extremities and lower extremities, sensation normal throughout, DTRs 2+ throughout, no cerebellar signs, gait normal Psychiatric: normal affect, behavior normal, pleasant  GU: normal male external genitalia, circumcised, nontender, no masses, no hernia, no lymphadenopathy Rectal: anus normal tone, prostate WNL, no nodules,occult negative stool   Adult ECG Report  Indication: HTN  Rate: 74 bpm  Rhythm: normal sinus rhythm  QRS Axis: -7 degrees  PR Interval:  174 ms  QRS Duration: 86ms  QTc:  Conduction Disturbances: none  Other Abnormalities: none  Patient's cardiac risk factors are: hypertension and male gender.  EKG comparison: 06/2008 EKG   Narrative Interpretation: no acute changes     Assessment and Plan :      Encounter Diagnoses  Name Primary?  . Routine general medical examination at a health care facility Yes  . Need for Tdap vaccination   . Special screening for malignant neoplasms, colon   . Impaired fasting blood sugar   . Alcohol abuse   . Essential hypertension, benign   . Depression     Physical exam - discussed healthy lifestyle, diet, exercise, preventative care, vaccinations, and addressed their concerns.  establish with different ophthalmologist.  Gave contact info for local eye doctors.    tdap vaccine, VIS and counseling given  Referral for 1st screening colonoscopy  Impaired fasting glucose - pending labs  ETOH abuse - discussed risks of ETOH abuse, advised he go for counseling, ETOH abuse treatment, consider attending AA meeting.  Counseled on this, gave info regarding mental health resources in the area  HTN - change to Lisinopril HCT, recheck 13mo  Depression - c/t same medication, counseled on mood, advise he seek counseling, psychiatric care.  Follow-up pending labs

## 2013-01-14 NOTE — Patient Instructions (Signed)
I recommend you make some changes regarding your health - physically, mentally, and emotionally.  Consider counseling.  I have listed some provider information below  Consider cutting back on alcohol due to health and other related risks as we discussed.    Consider attending an AA meeting, alcoholics anonymous which is a support group to help with alcohol use.   Begin Lisinopril/HCT 25/12.5 mg once daily.  This is a higher dose to help lower your blood pressure medication  Get established with a different eye doctor  We updated your Tdap vaccine today.  This is good for 10 years  We will refer you to have your first colonoscopy screening.    Ophthalmology/Eye doctors Dr. Glenford Peers [address] [place], [place] [address] [phone]   [facility] Dr. Gelene Mink [address], [place] [place], [place] [address]  [phone] [email or web address]   Vincenza Hews, M.D. Susanne Greenhouse, O.D. [address] [place], [place] [address] Medical telephone: [phone] Optical telephone: [phone]   RESOURCES in [place], [place]  If you are experiencing a mental health crisis or an emergency, please call 911 or go to the nearest emergency department.  [facility]   [phone] [facility]  [phone] [facility]   [phone]  Suicide Hotline 1-800-Suicide 337-661-0807)  National Suicide Prevention Lifeline (224)436-4785  573-065-7784)  Domestic Violence, Rape/Crisis - [facility] [place] 627-035-0093  [employer] Violence Hotline 1-800-799-SAFE (807) 397-4475)  To report Child or Elder Abuse, please call: [facility] Police Department  [phone] [facility] Department  [phone]  [facility] Crisis Line [phone]  Teen Crisis line [phone] or [phone]     Psychiatry and Counseling services   Dr. Andee Poles, psychiatry [phone]  office [email or web address] [address], [address] [place], [place], [place] [address] Dr. Andee Poles Valinda Hoar, NP Grayland Ormond, NP  Anxiety, Depression, ADHD, OCD, Eating Disorders, Bipolar, other   [facility] [phone] office [email or web address] [address] [place], [place] [address]  Dr. Lynden Ang, psychiatry services  Dr. Bennie Dallas  Depression, Anxiety, Substance Abuse, Couples Issues, Adolescent Issues Oneta Rack, NP Depression, Anxiety, ADHD, Women's issues, Bipolar Disorder, Substance   Abuse Saul Fordyce, NP Depression, Anxiety, Aging, ADHD, Bipolar Disorder, Substance Abuse Manuela Neptune, NP  Mood disturbances, ADHD, children, adolescents, adults Geronimo Running, Therapist Sexual Addiction, Bipolar, Depression, Anxiety, Substance Addiction Shaaron Adler, Therapist Grief and Loss, Anxiety, Depression, Bipolar, Medical Challenges, Life    Transitions Michaelle Copas, Therapist Substance Abuse, Relationships, Clergy Families, Anger and Stress Management, Postpartum Depression, Pre-Marital Counseling Rochele Raring, Therapist [place]sim, Anxiety, Depression, ADHD, Adjustment Disorder, PTSD, Grief and Loss, Divorce, Adoption Concerns   [facility] [phone] office [email or web address] [address] [place], [place], [place] [address]  Franchot Erichsen, MA, clinical psychologist  Cognitive-Behavior Therapy; Mood Disorders; Anxiety Disorders; adult and child ADHD; Family Therapy; Stress Management; personal growth, and Marital Therapy.    Carlus Pavlov Ph.D., clinical psychologist Cognitive-Behavior Therapy; Mood Disorders; Anxiety Disorders; Stress     Management   Miguel Aschoff Ph.D., clinical psychologist [phone] office [address] [place], [place] [address] Cognitive Behavior Therapy, Depression, Bipolar, Anxiety, Grief and Loss    [facility] Services  of the [facility] [phone] office [address], [place], [place] [address] Crisis services, Family support, in home therapy, treatment for Anxiety, PTSD, Sexual Assault, Substance Abuse, Financial/Credit Counseling, Variety of other services    [facility] [email or web address] [phone]  office [address], [place], [place] [address]  Veneda Melter, Ph.D., [facility] Family, Couples, Anxiety, Depression, ADHD, Abuse, Anger Management Sherie Don, M.Ed., LPC Couples, Sexual orientation, Domestic violence, Child Abuse, Major Life Change,  Depression Leandra Kern, [place] Marriage counseling, Women's Issues, Depression, Intimacy, Career Issues Madelaine Etienne, Ph.D.,  LPC PTSD, Addictions, Grief, Anxiety, Sexual Orientation Reather Laurence, [facility] Teen and child depression, anxiety, parenting challenges, Adult depression, self injury, relationship issues. [place], [facility] Addiction, PTSD, Eating Disorders, Depression, Sexual Orientation Daun Peacock, [facility] Eating disorder, Anxiety and Depression, Grief, Divorce, Couples and Family Counseling, Parenting   Dr. Archer Asa, psychiatry [phone] office [address] [place], [place] [address]  Geriatric psychiatry services   [facility] [phone] office, 24x7 help line [email or web address] [address] [place], [place] [address] Substance Abuse, Depression, Anxiety, Mood Disorders, other Addictions, DWI Assessment/Treatment, Teen Issues, ADHD, Family Therapy Dr. Ezzard Flax, Psychiatry services   Posey Rea, Therapist Initial assessments, Clinical Director, Substance Abuse counseling, DWI and DMV assessments, individual and group counseling Arrie Senate, Therapist Depression, Anxiety, Dysfunctional families, Individual and Couples Counseling, Addiction, Sexual Abuse, Childhood Trauma, Spiritually Based Counseling Robin Ringer,  Therapist [facility], Children and Adult Individual Counseling, Depression, Anxiety, Mood Disorders Danice Goltz, Therapist Ages 5 and up, individual, couple and family therapy, family concerns, ADHD, Mood disorders, Grief, Substance Abuse Weston Settle, Therapist Male patients only - Mood disorders, Depression, Anxiety, PTSD, Gried,   Abuse, Relationships   Dr. Milagros Evener, psychiatry [phone] office [address], [place], [place] [address]

## 2013-01-15 LAB — COMPREHENSIVE METABOLIC PANEL
ALT: 22 U/L (ref 0–53)
Alkaline Phosphatase: 84 U/L (ref 39–117)
Calcium: 9.9 mg/dL (ref 8.4–10.5)
Creat: 0.95 mg/dL (ref 0.50–1.35)
Glucose, Bld: 112 mg/dL — ABNORMAL HIGH (ref 70–99)
Potassium: 4.5 mEq/L (ref 3.5–5.3)
Sodium: 137 mEq/L (ref 135–145)
Total Protein: 7.5 g/dL (ref 6.0–8.3)

## 2013-01-15 LAB — CBC WITH DIFFERENTIAL/PLATELET
Basophils Absolute: 0 10*3/uL (ref 0.0–0.1)
Basophils Relative: 0 % (ref 0–1)
Lymphs Abs: 1.8 10*3/uL (ref 0.7–4.0)
MCH: 32.3 pg (ref 26.0–34.0)
MCHC: 36 g/dL (ref 30.0–36.0)
MCV: 89.9 fL (ref 78.0–100.0)
Monocytes Absolute: 0.6 10*3/uL (ref 0.1–1.0)
RBC: 4.95 MIL/uL (ref 4.22–5.81)

## 2013-01-15 LAB — LIPID PANEL
LDL Cholesterol: 131 mg/dL — ABNORMAL HIGH (ref 0–99)
Triglycerides: 133 mg/dL (ref <150)

## 2013-01-15 LAB — TSH: TSH: 2.989 u[IU]/mL (ref 0.350–4.500)

## 2013-01-15 LAB — HEMOGLOBIN A1C: Hgb A1c MFr Bld: 5.8 % — ABNORMAL HIGH (ref <5.7)

## 2013-01-15 NOTE — Addendum Note (Signed)
Addended by: Jac Canavan on: 01/15/2013 07:19 AM   Modules accepted: Orders

## 2013-01-16 ENCOUNTER — Encounter: Payer: Self-pay | Admitting: Gastroenterology

## 2013-01-22 ENCOUNTER — Telehealth: Payer: Self-pay | Admitting: Family Medicine

## 2013-01-22 NOTE — Telephone Encounter (Signed)
Patient is aware of his appointment with [facility] on Feb 14, 2013 @ 2 pm nurse visit and Feb 28, 2013 @ 1000 am  colonoscopy with Dr. Arlyce Dice. CLS

## 2013-01-26 ENCOUNTER — Encounter (HOSPITAL_COMMUNITY): Payer: Self-pay | Admitting: *Deleted

## 2013-01-26 ENCOUNTER — Emergency Department (HOSPITAL_COMMUNITY)
Admission: EM | Admit: 2013-01-26 | Discharge: 2013-01-27 | Disposition: A | Discharge status: Home / Self Care | Payer: Worker's Compensation | Attending: Emergency Medicine | Admitting: Emergency Medicine

## 2013-01-26 DIAGNOSIS — Y9289 Other specified places as the place of occurrence of the external cause: Secondary | ICD-10-CM | POA: Insufficient documentation

## 2013-01-26 DIAGNOSIS — Z8679 Personal history of other diseases of the circulatory system: Secondary | ICD-10-CM | POA: Insufficient documentation

## 2013-01-26 DIAGNOSIS — Y9389 Activity, other specified: Secondary | ICD-10-CM | POA: Insufficient documentation

## 2013-01-26 DIAGNOSIS — F3289 Other specified depressive episodes: Secondary | ICD-10-CM | POA: Insufficient documentation

## 2013-01-26 DIAGNOSIS — F329 Major depressive disorder, single episode, unspecified: Secondary | ICD-10-CM | POA: Insufficient documentation

## 2013-01-26 DIAGNOSIS — T65891A Toxic effect of other specified substances, accidental (unintentional), initial encounter: Secondary | ICD-10-CM | POA: Insufficient documentation

## 2013-01-26 DIAGNOSIS — I10 Essential (primary) hypertension: Secondary | ICD-10-CM | POA: Insufficient documentation

## 2013-01-26 DIAGNOSIS — Z8669 Personal history of other diseases of the nervous system and sense organs: Secondary | ICD-10-CM | POA: Insufficient documentation

## 2013-01-26 DIAGNOSIS — Z79899 Other long term (current) drug therapy: Secondary | ICD-10-CM | POA: Insufficient documentation

## 2013-01-26 DIAGNOSIS — Y990 Civilian activity done for income or pay: Secondary | ICD-10-CM | POA: Insufficient documentation

## 2013-01-26 DIAGNOSIS — Z87442 Personal history of urinary calculi: Secondary | ICD-10-CM | POA: Insufficient documentation

## 2013-01-26 DIAGNOSIS — Z880 Allergy status to penicillin: Secondary | ICD-10-CM | POA: Insufficient documentation

## 2013-01-26 NOTE — ED Notes (Signed)
Pt states he was at work and went to get a drink of water out of cooler and it tasted horrible,  So he looked in trash can next to cooler and there was an empty bleach bottle in trash can,  Pt denies any burning in throat or mouth,  Pt states it taste "funny" in his mouth
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
## 2013-01-27 NOTE — ED Notes (Signed)
Pt alert x4 v/s stable, no c/o discomfort, no n/v/d, will continue to monitor.

## 2013-01-27 NOTE — ED Provider Notes (Signed)
Medical screening examination/treatment/procedure(s) were performed by non-physician practitioner and as supervising physician I was immediately available for consultation/collaboration.  Rune Mendez, MD 01/27/13 0452 

## 2013-02-06 ENCOUNTER — Other Ambulatory Visit: Payer: Self-pay | Admitting: Medical

## 2013-02-06 ENCOUNTER — Telehealth: Payer: Self-pay | Admitting: Family Medicine

## 2013-02-06 MED ORDER — SERTRALINE HCL 100 MG PO TABS: 100.0000 mg | ORAL_TABLET | Freq: Every day | ORAL | Status: DC

## 2013-02-06 NOTE — Telephone Encounter (Signed)
Refilled medication, have him f/u in a week or 2.

## 2013-02-08 NOTE — Telephone Encounter (Signed)
Patient is aware and his appointment is 02/18/13. CLS

## 2013-02-14 ENCOUNTER — Ambulatory Visit (AMBULATORY_SURGERY_CENTER): Payer: Managed Care, Other (non HMO) | Admitting: *Deleted

## 2013-02-14 VITALS — Ht 75.0 in | Wt 211.6 lb

## 2013-02-14 DIAGNOSIS — Z1211 Encounter for screening for malignant neoplasm of colon: Secondary | ICD-10-CM

## 2013-02-14 MED ORDER — NA SULFATE-K SULFATE-MG SULF 17.5-3.13-1.6 GM/177ML PO SOLN: ORAL | Status: DC

## 2013-02-15 ENCOUNTER — Encounter: Payer: Self-pay | Admitting: Gastroenterology

## 2013-02-18 ENCOUNTER — Encounter: Payer: Self-pay | Admitting: Medical

## 2013-02-18 ENCOUNTER — Ambulatory Visit (INDEPENDENT_AMBULATORY_CARE_PROVIDER_SITE_OTHER): Payer: Managed Care, Other (non HMO) | Admitting: Medical

## 2013-02-18 VITALS — BP 120/82 | HR 68 | Temp 98.2°F | Resp 16 | Wt 212.0 lb

## 2013-02-18 DIAGNOSIS — F1010 Alcohol abuse, uncomplicated: Secondary | ICD-10-CM

## 2013-02-18 DIAGNOSIS — F329 Major depressive disorder, single episode, unspecified: Secondary | ICD-10-CM

## 2013-02-18 DIAGNOSIS — I10 Essential (primary) hypertension: Secondary | ICD-10-CM

## 2013-02-18 NOTE — Progress Notes (Signed)
Subjective: Here for recheck.  BP has been much better since the change in BP medication last visit.  Has been cutting back on alcohol, is drinking very little now.  3 nights a week doesn't drink anything.   The most he has had was 4 beers on the weekend.  This is a big change since last visit.  Taking Zoloft.   Has not persuaded counseling.  Has checked with his insurance, and coverage is not good for therapy.  He did get a raise at work, which was long coming.  He has been looking on [email or web address], been pursing dating.  Overall feels happier and has better mood than last visit since getting BP under control and cutting back on alcohol.   Has weekly schedule where he and his child goes and does something fun.  No new c/o.    Past Medical History  Diagnosis Date  . Hypertension   . Renal stones   . Depression   . Left varicocele   . Central serous retinopathy     hx/o intraocular injection therapy for 3 years; no change after 3 years of therapy, no visual c/o  . Former smoker   . Port-wine stain of face    ROS as in subjective  Objective: Gen: wd, wn, nad Psych: pleasant, norma affect, good eye contact Heart: RRR, normal S1, S2, no murmurs Lungs: clear Pulses normal   Assessment: Encounter Diagnoses  Name Primary?  . Essential hypertension, benign Yes  . Depression   . Alcohol abuse    Plan: HTN - much improved.  C/t Lisinopril HCT.  BMET lab today  Depression - doing better, c/t Zoloft, discussed his concerns, still advised he consider counseling.  Overall seems to be doing better with overall outlook, attitude, and social situation.  Alcohol abuse - has cut way down on alcohol.  Glad to hear the progress he has made.  Encouraged him to cut down even more, don't got back to the old habits  F/u as planned for colonoscopy with Dr. Arlyce Dice

## 2013-02-19 LAB — BASIC METABOLIC PANEL: Chloride: 105 mEq/L (ref 96–112)

## 2013-02-27 ENCOUNTER — Telehealth: Payer: Self-pay | Admitting: Gastroenterology

## 2013-02-27 ENCOUNTER — Encounter: Payer: Self-pay | Admitting: Family Medicine

## 2013-02-27 NOTE — Telephone Encounter (Signed)
Returned patient's call and he states that he is sick with an upper respiratory infection and is congested with cough.  He wants to reschedule and doesn't want to go through the prep and have procedure while he is sick.  He also stated that it is going around at his work.   He was concerned about being charged for not having it tomorrow and I advised him I would send Dr. Arlyce Dice the note explaining the situation.  I advised him also to call and reschedule when he is well.  He agreed and understood.  I cancelled the procedure scheduled for tomorrow and note routed to Dr. Arlyce Dice for review.

## 2013-02-28 ENCOUNTER — Encounter: Payer: Managed Care, Other (non HMO) | Admitting: Gastroenterology

## 2013-02-28 ENCOUNTER — Telehealth: Payer: Self-pay | Admitting: Medical

## 2013-02-28 ENCOUNTER — Ambulatory Visit: Payer: Managed Care, Other (non HMO) | Admitting: Family Medicine

## 2013-02-28 NOTE — Telephone Encounter (Signed)
Patient returned call to [facility], informed patient that she is not in today and that she actually mailed him a letter with his lab results, read letter/info to patient.

## 2013-02-28 NOTE — Telephone Encounter (Signed)
No problem.

## 2013-05-08 ENCOUNTER — Other Ambulatory Visit: Payer: Self-pay | Admitting: Medical

## 2013-05-15 ENCOUNTER — Telehealth: Payer: Self-pay | Admitting: Internal Medicine

## 2013-05-15 MED ORDER — LISINOPRIL-HYDROCHLOROTHIAZIDE 20-12.5 MG PO TABS: 1.0000 | ORAL_TABLET | Freq: Every day | ORAL | Status: DC

## 2013-05-15 NOTE — Telephone Encounter (Signed)
MEDICATION REFILL SENT TO HIS PHARMACY. cls

## 2013-05-15 NOTE — Telephone Encounter (Signed)
Refill request for [facility]hctz [facility] [place]

## 2013-07-05 DIAGNOSIS — Z0279 Encounter for issue of other medical certificate: Secondary | ICD-10-CM

## 2013-07-19 ENCOUNTER — Telehealth: Payer: Self-pay | Admitting: Internal Medicine

## 2013-07-19 NOTE — Telephone Encounter (Signed)
Faxed over medical records to Disability determination services @ [phone]

## 2013-08-07 ENCOUNTER — Other Ambulatory Visit: Payer: Self-pay | Admitting: Medical

## 2013-08-07 NOTE — Telephone Encounter (Signed)
Is this okay to refill? 

## 2013-11-12 ENCOUNTER — Other Ambulatory Visit: Payer: Self-pay | Admitting: Medical

## 2013-11-12 NOTE — Telephone Encounter (Signed)
Medications sent

## 2013-11-12 NOTE — Telephone Encounter (Signed)
Is this okay to refill? Patient states that he lost his job and he doesn't have money to pay for a OV. CLS

## 2013-12-09 ENCOUNTER — Other Ambulatory Visit: Payer: Self-pay | Admitting: Medical

## 2014-02-11 ENCOUNTER — Other Ambulatory Visit: Payer: Self-pay | Admitting: Medical

## 2014-03-17 ENCOUNTER — Other Ambulatory Visit: Payer: Self-pay | Admitting: Medical

## 2014-04-28 ENCOUNTER — Other Ambulatory Visit: Payer: Self-pay | Admitting: Medical

## 2014-05-27 ENCOUNTER — Other Ambulatory Visit: Payer: Self-pay | Admitting: Medical

## 2014-06-26 ENCOUNTER — Other Ambulatory Visit: Payer: Self-pay | Admitting: Medical

## 2014-07-28 ENCOUNTER — Other Ambulatory Visit: Payer: Self-pay | Admitting: Medical

## 2014-08-04 ENCOUNTER — Telehealth: Payer: Self-pay | Admitting: Family Medicine

## 2014-08-04 MED ORDER — LISINOPRIL-HYDROCHLOROTHIAZIDE 20-12.5 MG PO TABS: ORAL_TABLET | ORAL | Status: DC

## 2014-08-04 NOTE — Telephone Encounter (Signed)
Pt is out of work and has no ins, can't afford to come in right now.  Needs Lisinopril refilled to [facility] and will schedule an appt as soon as he can get the money.  States he's trying to keep off the streets right now.

## 2014-08-05 ENCOUNTER — Other Ambulatory Visit: Payer: Self-pay | Admitting: Medical

## 2014-08-06 NOTE — Telephone Encounter (Signed)
Jeffery [name], this patient states that he doesn't have a job and he doesn't have any insurance and he can't afford to come to the doctor, but he needs his medication

## 2014-08-06 NOTE — Telephone Encounter (Signed)
Unfortunately, I have to see a person at least once yearly in f/u on a medication.   Please make him an appt, remind him of cash pay discounts, etc.   Please note that his medication is generic and low costs too.

## 2014-11-03 ENCOUNTER — Other Ambulatory Visit: Payer: Self-pay | Admitting: Family Medicine

## 2014-11-03 ENCOUNTER — Telehealth: Payer: Self-pay | Admitting: Family Medicine

## 2014-11-03 NOTE — Telephone Encounter (Signed)
Per jcl- pt needs to come in for a nurse visit for bp check. If high then he needs to see provider. And Dr. Redmond School will give him a discount.   Pt is coming in sometime Wednesday morning for nurse visit. Pt is aware that he may have to see Dr. Redmond School if bp is high. And once we get a bp on him then we can refill his meds

## 2014-11-03 NOTE — Telephone Encounter (Signed)
Pt still out of work and does not have money to come in for appt right now. He is out of Lisinopril so requesting refill on this med

## 2014-11-03 NOTE — Telephone Encounter (Signed)
Have him at least come in for a nurse visit so we can get a blood pressure on him and then renew his medicine

## 2014-11-03 NOTE — Telephone Encounter (Signed)
I dont mind scheduling a nurse visit for this pt but if the pt's bp is high then they will need to see you anyways. What do you want me to do about this.

## 2014-11-05 ENCOUNTER — Other Ambulatory Visit: Payer: Managed Care, Other (non HMO)

## 2014-11-12 ENCOUNTER — Other Ambulatory Visit: Payer: Managed Care, Other (non HMO)

## 2014-11-12 ENCOUNTER — Other Ambulatory Visit: Payer: Self-pay

## 2014-11-12 MED ORDER — LISINOPRIL-HYDROCHLOROTHIAZIDE 20-12.5 MG PO TABS: ORAL_TABLET | ORAL | Status: DC

## 2014-11-12 NOTE — Progress Notes (Signed)
Patient has come in to have B/P checked and he was 130 /86 so I have refilled his med for him Patient has agreed to come back in 6 mths for a med check

## 2015-05-13 ENCOUNTER — Encounter: Payer: Self-pay | Admitting: Family Medicine

## 2015-05-13 ENCOUNTER — Ambulatory Visit (INDEPENDENT_AMBULATORY_CARE_PROVIDER_SITE_OTHER): Payer: Self-pay | Admitting: Family Medicine

## 2015-05-13 VITALS — BP 140/92 | HR 80 | Wt 179.0 lb

## 2015-05-13 DIAGNOSIS — I10 Essential (primary) hypertension: Secondary | ICD-10-CM

## 2015-05-13 MED ORDER — LISINOPRIL-HYDROCHLOROTHIAZIDE 20-12.5 MG PO TABS: ORAL_TABLET | ORAL | Status: DC

## 2015-05-13 NOTE — Progress Notes (Signed)
   Subjective:    Patient ID: Jeffery [name], male    DOB: [birth year], 54 y.o.   MRN: [id number]  HPI He is here for blood pressure check. He does not have insurance but plans to get this within the next several months. He would like a refill on his medication. He has no other concerns or complaints.   Review of Systems     Objective:   Physical Exam alert and in no distress otherwise not examined. Blood pressure is recorded.      Assessment & Plan:  Essential hypertension, benign - Plan: lisinopril-hydrochlorothiazide (PRINZIDE,ZESTORETIC) 20-12.5 MG per tablet  his blood pressure medicine was renewed. Encouraged him to set up an appointment for complete examination when he gets new insurance. He plans to do this.

## 2015-09-29 ENCOUNTER — Ambulatory Visit (INDEPENDENT_AMBULATORY_CARE_PROVIDER_SITE_OTHER): Payer: BLUE CROSS/BLUE SHIELD | Admitting: Family Medicine

## 2015-09-29 ENCOUNTER — Encounter: Payer: Self-pay | Admitting: Family Medicine

## 2015-09-29 DIAGNOSIS — T148 Other injury of unspecified body region: Secondary | ICD-10-CM

## 2015-09-29 DIAGNOSIS — T148XXA Other injury of unspecified body region, initial encounter: Secondary | ICD-10-CM

## 2015-09-29 NOTE — Patient Instructions (Signed)
Take 4 ibuprofen 3 times per day 
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
## 2015-10-09 ENCOUNTER — Ambulatory Visit (INDEPENDENT_AMBULATORY_CARE_PROVIDER_SITE_OTHER): Payer: BLUE CROSS/BLUE SHIELD | Admitting: Family Medicine

## 2015-10-09 ENCOUNTER — Encounter: Payer: Self-pay | Admitting: Family Medicine

## 2015-10-09 DIAGNOSIS — T148 Other injury of unspecified body region: Secondary | ICD-10-CM

## 2015-10-09 DIAGNOSIS — T148XXA Other injury of unspecified body region, initial encounter: Secondary | ICD-10-CM
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
## 2015-11-10 ENCOUNTER — Ambulatory Visit (INDEPENDENT_AMBULATORY_CARE_PROVIDER_SITE_OTHER): Payer: BLUE CROSS/BLUE SHIELD | Admitting: Family Medicine

## 2015-11-10 ENCOUNTER — Encounter: Payer: Self-pay | Admitting: Family Medicine

## 2015-11-10 VITALS — BP 160/90 | HR 88 | Ht 75.0 in | Wt 191.0 lb

## 2015-11-10 DIAGNOSIS — I10 Essential (primary) hypertension: Secondary | ICD-10-CM | POA: Diagnosis not present

## 2015-11-10 DIAGNOSIS — Z1159 Encounter for screening for other viral diseases: Secondary | ICD-10-CM | POA: Diagnosis not present

## 2015-11-10 DIAGNOSIS — Z0000 Encounter for general adult medical examination without abnormal findings: Secondary | ICD-10-CM | POA: Diagnosis not present

## 2015-11-10 DIAGNOSIS — M7711 Lateral epicondylitis, right elbow: Secondary | ICD-10-CM

## 2015-11-10 LAB — COMPREHENSIVE METABOLIC PANEL
ALK PHOS: 69 U/L (ref 40–115)
ALT: 13 U/L (ref 9–46)
AST: 14 U/L (ref 10–35)
Albumin: 4.5 g/dL (ref 3.6–5.1)
BUN: 18 mg/dL (ref 7–25)
CALCIUM: 9.7 mg/dL (ref 8.6–10.3)
CO2: 27 mmol/L (ref 20–31)
Chloride: 102 mmol/L (ref 98–110)
Creat: 0.95 mg/dL (ref 0.70–1.33)
Glucose, Bld: 96 mg/dL (ref 65–99)
POTASSIUM: 4.4 mmol/L (ref 3.5–5.3)
SODIUM: 140 mmol/L (ref 135–146)
TOTAL PROTEIN: 6.7 g/dL (ref 6.1–8.1)
Total Bilirubin: 0.7 mg/dL (ref 0.2–1.2)

## 2015-11-10 LAB — CBC WITH DIFFERENTIAL/PLATELET
BASOS ABS: 0 10*3/uL (ref 0.0–0.1)
Basophils Relative: 0 % (ref 0–1)
EOS ABS: 0.2 10*3/uL (ref 0.0–0.7)
Eosinophils Relative: 3 % (ref 0–5)
HCT: 42.7 % (ref 39.0–52.0)
HEMOGLOBIN: 14.6 g/dL (ref 13.0–17.0)
LYMPHS ABS: 1.3 10*3/uL (ref 0.7–4.0)
Lymphocytes Relative: 24 % (ref 12–46)
MCH: 31.2 pg (ref 26.0–34.0)
MCHC: 34.2 g/dL (ref 30.0–36.0)
MCV: 91.2 fL (ref 78.0–100.0)
MPV: 8.8 fL (ref 8.6–12.4)
Monocytes Absolute: 0.6 10*3/uL (ref 0.1–1.0)
Monocytes Relative: 11 % (ref 3–12)
NEUTROS ABS: 3.3 10*3/uL (ref 1.7–7.7)
NEUTROS PCT: 62 % (ref 43–77)
PLATELETS: 251 10*3/uL (ref 150–400)
RBC: 4.68 MIL/uL (ref 4.22–5.81)
RDW: 12.7 % (ref 11.5–15.5)
WBC: 5.3 10*3/uL (ref 4.0–10.5)

## 2015-11-10 LAB — LIPID PANEL
CHOL/HDL RATIO: 2.7 ratio (ref <=5.0)
CHOLESTEROL: 199 mg/dL (ref 125–200)
HDL: 75 mg/dL (ref >=40)
LDL Cholesterol: 115 mg/dL (ref <130)
TRIGLYCERIDES: 47 mg/dL (ref <150)
VLDL: 9 mg/dL (ref <30)

## 2015-11-10 MED ORDER — LISINOPRIL-HYDROCHLOROTHIAZIDE 20-12.5 MG PO TABS: ORAL_TABLET | ORAL | Status: DC

## 2015-11-10 MED ORDER — AMLODIPINE BESYLATE 5 MG PO TABS: 5.0000 mg | ORAL_TABLET | Freq: Every day | ORAL | Status: DC

## 2015-11-10 NOTE — Progress Notes (Signed)
Subjective:    Patient ID: Jeffery [name], male    DOB: [birth year], 55 y.o.   MRN: [id number]  HPI He is here for complete examination. He still is having some difficulty with right lateral elbow discomfort especially when he uses his arms a lot which he doesn't work. He is taking lisinopril/HCTZ and having no difficulty with that. He does have a previous history of depression but seems to be doing fairly well. He now has a job since August and feels much better about himself. He still would like to be in a relationship and notes that his ex-wife is planning to get remarried which does have him and slight bit of a funk. Otherwise he seems to be doing fairly well. He's had no chest pain, shortness of breath, GI issues. Family and social history as well as health maintenance and immunizations were reviewed.   Review of Systems  All other systems reviewed and are negative.      Objective:   Physical Exam BP 160/90 mmHg  Pulse 88  Ht 6\' 3"  (1.905 m)  Wt 191 lb (86.637 kg)  BMI 23.87 kg/m2  SpO2 98%  General Appearance:    Alert, cooperative, no distress, appears stated age  Head:    Normocephalic, without obvious abnormality, atraumatic  Eyes:    PERRL, conjunctiva/corneas clear, EOM's intact, fundi    benign  Ears:    Normal TM's and external ear canals  Nose:   Nares normal, mucosa normal, no drainage or sinus   tenderness  Throat:   Lips, mucosa, and tongue normal; teeth and gums normal  Neck:   Supple, no lymphadenopathy;  thyroid:  no   enlargement/tenderness/nodules; no carotid   bruit or JVD  Back:    Spine nontender, no curvature, ROM normal, no CVA     tenderness  Lungs:     Clear to auscultation bilaterally without wheezes, rales or     ronchi; respirations unlabored  Chest Wall:    No tenderness or deformity   Heart:    Regular rate and rhythm, S1 and S2 normal, no murmur, rub   or gallop  Breast Exam:    No chest wall tenderness, masses or gynecomastia  Abdomen:      Soft, non-tender, nondistended, normoactive bowel sounds,    no masses, no hepatosplenomegaly        Extremities:   No clubbing, cyanosis or edema. Tender to palpation over the right lateral epicondyle. Good motion of the elbow.  Pulses:   2+ and symmetric all extremities  Skin:   Skin color, texture, turgor normal, no rashes or lesions.Port wine stain noticed on right face  Lymph nodes:   Cervical, supraclavicular, and axillary nodes normal  Neurologic:   CNII-XII intact, normal strength, sensation and gait; reflexes 2+ and symmetric throughout          Psych:   Normal mood, affect, hygiene and grooming.          Assessment & Plan:  Routine general medical examination at a health care facility - Plan: CBC with Differential/Platelet, Comprehensive metabolic panel, Lipid panel  Essential hypertension, benign - Plan: lisinopril-hydrochlorothiazide (PRINZIDE,ZESTORETIC) 20-12.5 MG tablet, amLODipine (NORVASC) 5 MG tablet  Need for hepatitis C screening test - Plan: Hepatitis C antibody  Lateral epicondylitis (tennis elbow), right I will place him on amlodipine and recheck here in one month. Explained to do his me things as he can palms up and open to help with his right elbow discomfort.

## 2015-11-10 NOTE — Patient Instructions (Signed)
Do as many things as you can palms up and open. You can take up to 4 Advil 3 times per day and heat to the area for 20 minutes 3 times per

## 2015-11-11 LAB — HEPATITIS C ANTIBODY: HCV Ab: NEGATIVE

## 2015-11-11 NOTE — Progress Notes (Signed)
Notified patient   His blood work looks good and no evidence of hepatitis C

## 2015-12-08 ENCOUNTER — Encounter: Payer: Self-pay | Admitting: Family Medicine

## 2015-12-08 ENCOUNTER — Ambulatory Visit (INDEPENDENT_AMBULATORY_CARE_PROVIDER_SITE_OTHER): Payer: BLUE CROSS/BLUE SHIELD | Admitting: Family Medicine

## 2015-12-08 VITALS — BP 140/82 | HR 84 | Wt 183.0 lb

## 2015-12-08 DIAGNOSIS — I10 Essential (primary) hypertension: Secondary | ICD-10-CM

## 2015-12-08 DIAGNOSIS — F341 Dysthymic disorder: Secondary | ICD-10-CM | POA: Diagnosis not present

## 2015-12-08 DIAGNOSIS — H35712 Central serous chorioretinopathy, left eye: Secondary | ICD-10-CM | POA: Diagnosis not present

## 2015-12-08 NOTE — Progress Notes (Signed)
   Subjective:    Patient ID: Jeffery [name], male    DOB: 28-Jul-1961, 55 y.o.   MRN: [id number]  HPI He is here for recheck on his hypertension. He did initially have some difficulty with the diuretic causing ED but states this is not a problem. He has a history of difficulty with central serous retinopathy and has been getting shots through [employer]. He has stopped taking these due to feeling as if wasn't working but he would like follow-up on this. He also states that he has had depression-like symptoms his entire life and is now interested in pursuing getting this taken care of. He is not interested in any medication.   Review of Systems     Objective:   Physical Exam Alert and in no distress with appropriate affect       Assessment & Plan:  Central serous retinopathy, left - Plan: Ambulatory referral to Ophthalmology  Dysthymia  Essential hypertension, benign continue on blood pressure medication. I will refer him to [facility] for further evaluation and treatment.

## 2015-12-08 NOTE — Patient Instructions (Signed)
Call [facility] [phone]

## 2015-12-08 NOTE — Progress Notes (Signed)
Patient has appointment on March 17 @  9 am Dr Wyatt Portela

## 2016-01-07 ENCOUNTER — Ambulatory Visit (INDEPENDENT_AMBULATORY_CARE_PROVIDER_SITE_OTHER): Payer: BLUE CROSS/BLUE SHIELD | Admitting: Family Medicine

## 2016-01-07 VITALS — BP 140/96 | HR 84 | Ht 75.0 in | Wt 182.2 lb

## 2016-01-07 DIAGNOSIS — M25561 Pain in right knee: Secondary | ICD-10-CM

## 2016-01-07 DIAGNOSIS — M79645 Pain in left finger(s): Secondary | ICD-10-CM

## 2016-01-07 DIAGNOSIS — M25562 Pain in left knee: Secondary | ICD-10-CM

## 2016-01-07 NOTE — Progress Notes (Signed)
   Subjective:    Patient ID: Jeffery [name], male    DOB: June 20, 1961, 55 y.o.   MRN: [id number]  HPI He complains of pain since 2010. He relates this to a job change requiring him to stand with slightly bent knees forExtended period of time. He notes now that when he stands for a long period time his knees do give him pain and he does have popping sensation in them. He has been using ibuprofen 6 pills per day. He also continues to have difficulty with left fourth finger. He did interact causing some slight swelling over the dorsum of the finger. He does play guitar and this is interfering with his ability to play.     Review of Systems     Objective:   Physical Exam Exam of left finger does show questionable small ganglion over the dorsum of the finger. Exam of both knee shows no effusion. Good motion. No laxity.       Assessment & Plan:  Bilateral knee pain - Plan: DG Knee Complete 4 Views Left, DG Knee Complete 4 Views Right  Pain of finger of left hand - Plan: Ambulatory referral to Orthopedic Surgery I will send to orthopedics for hand evaluation and help with his finger to help slightly complaint guitar. Explained the fact that the knee pain he is having is probably related to generalized arthritic type symptoms and not necessarily specifically related to his work.

## 2016-01-08 ENCOUNTER — Ambulatory Visit
Admission: RE | Admit: 2016-01-08 | Discharge: 2016-01-08 | Disposition: A | Discharge status: Home / Self Care | Payer: BLUE CROSS/BLUE SHIELD | Source: Ambulatory Visit | Attending: Family Medicine | Admitting: Family Medicine

## 2016-01-08 DIAGNOSIS — M25561 Pain in right knee: Secondary | ICD-10-CM

## 2016-01-08 DIAGNOSIS — M25562 Pain in left knee: Principal | ICD-10-CM

## 2016-01-08 IMAGING — CR DG KNEE COMPLETE 4+V*L*
4 series · 4 of 4 positions shown · non-contrast
Comparison: None.

CLINICAL DATA: Anterior knee pain which started 2 weeks ago. No
known injury.

EXAM:
LEFT KNEE - COMPLETE 4+ VIEW

[w knee ap left]
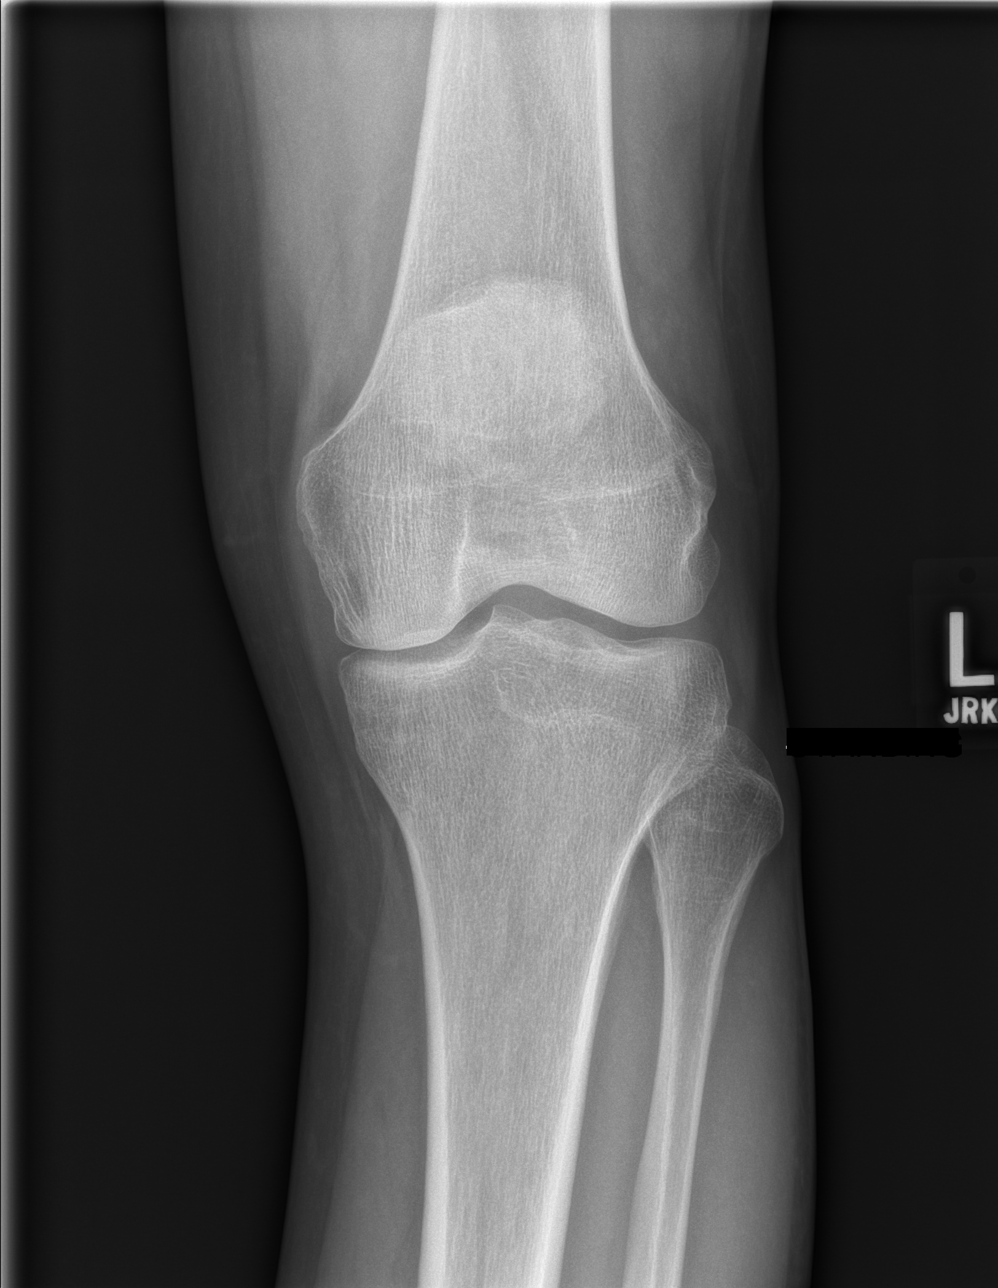

[w knee tunnel pa left]
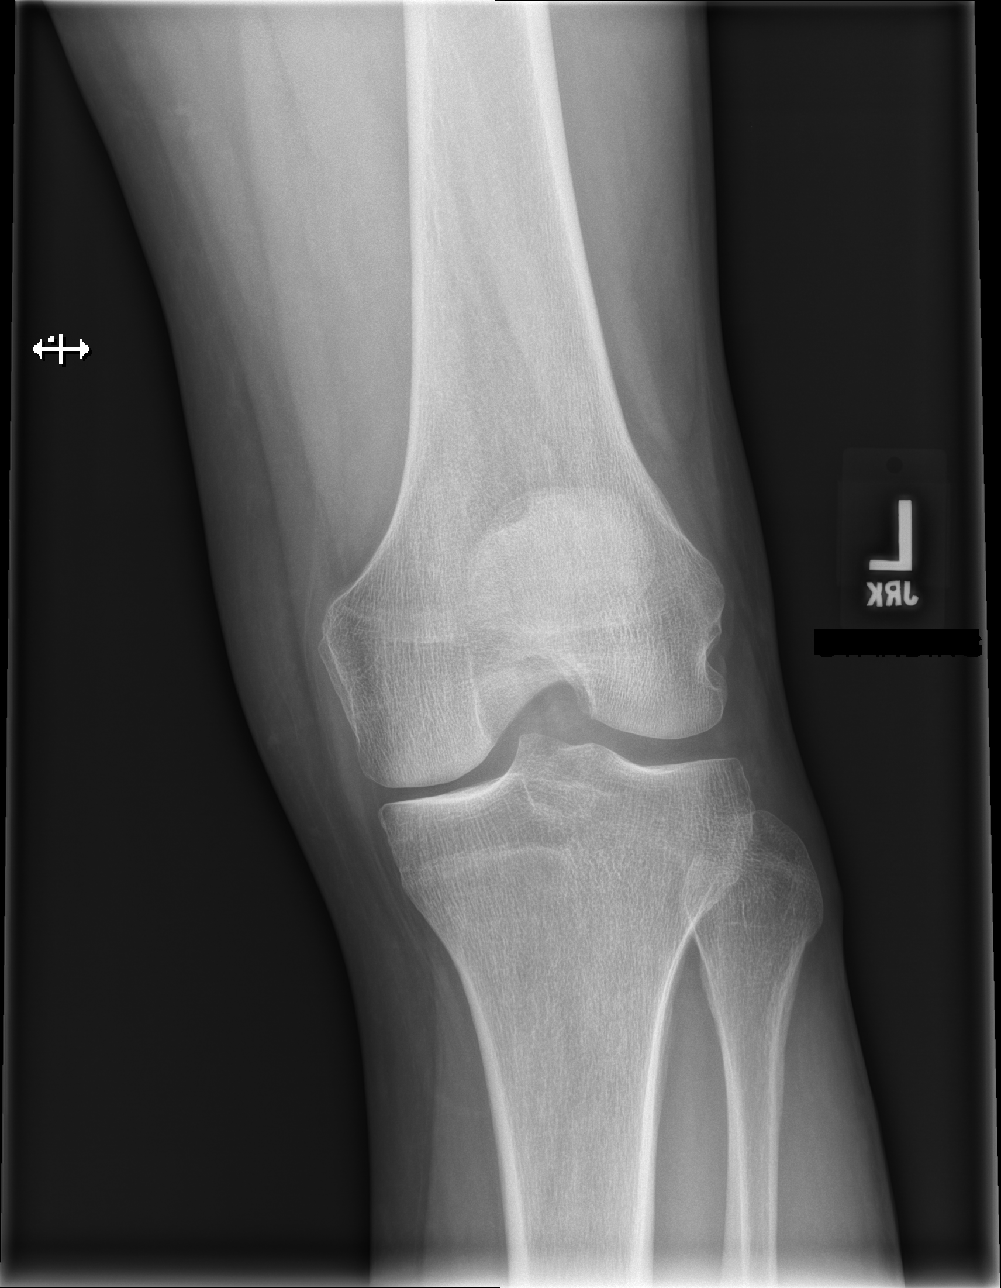

[w knee lat left]
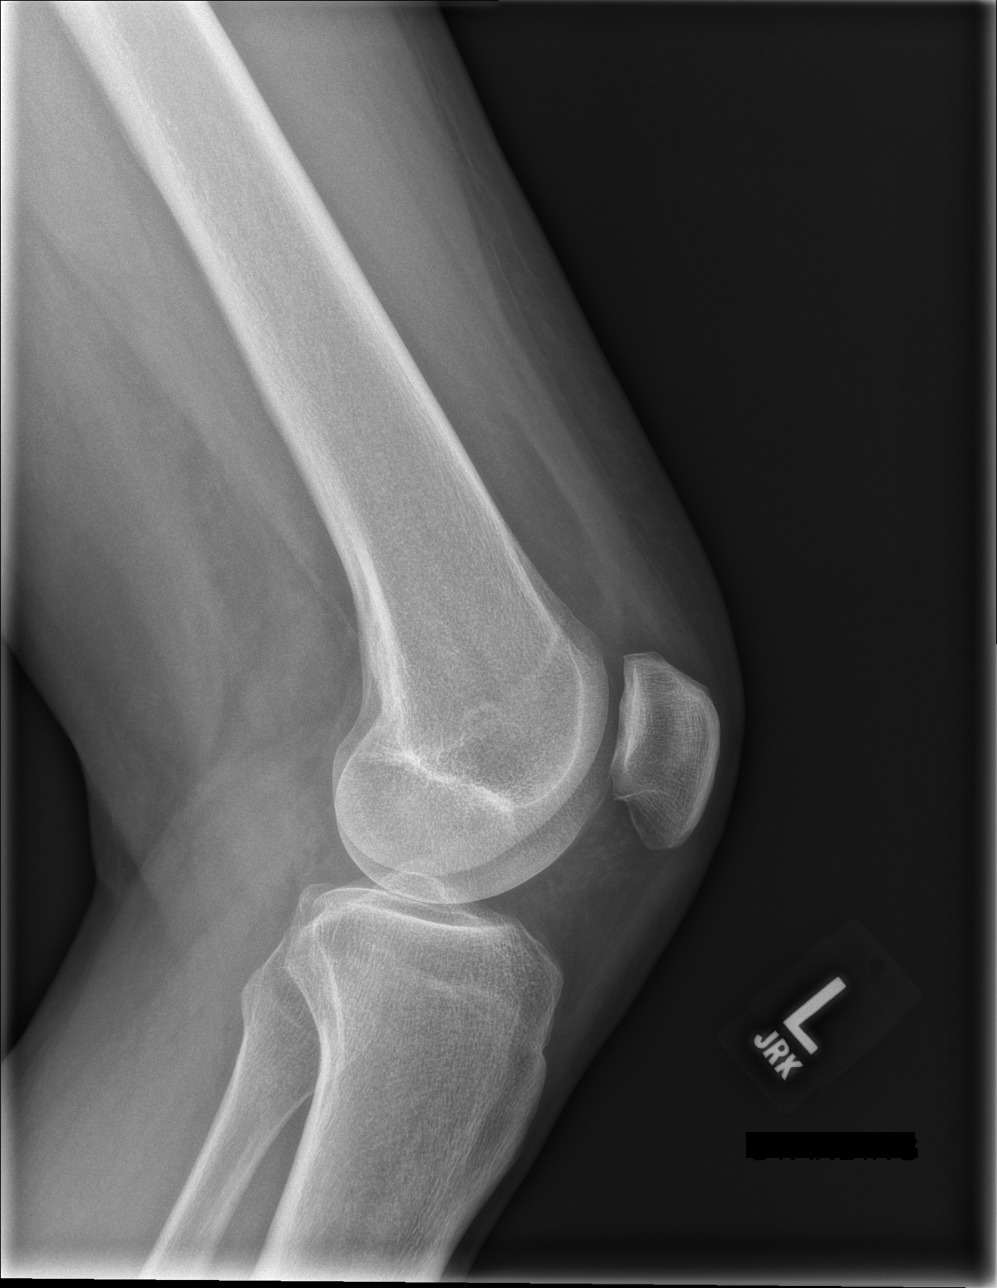

[x knee sunrise left]
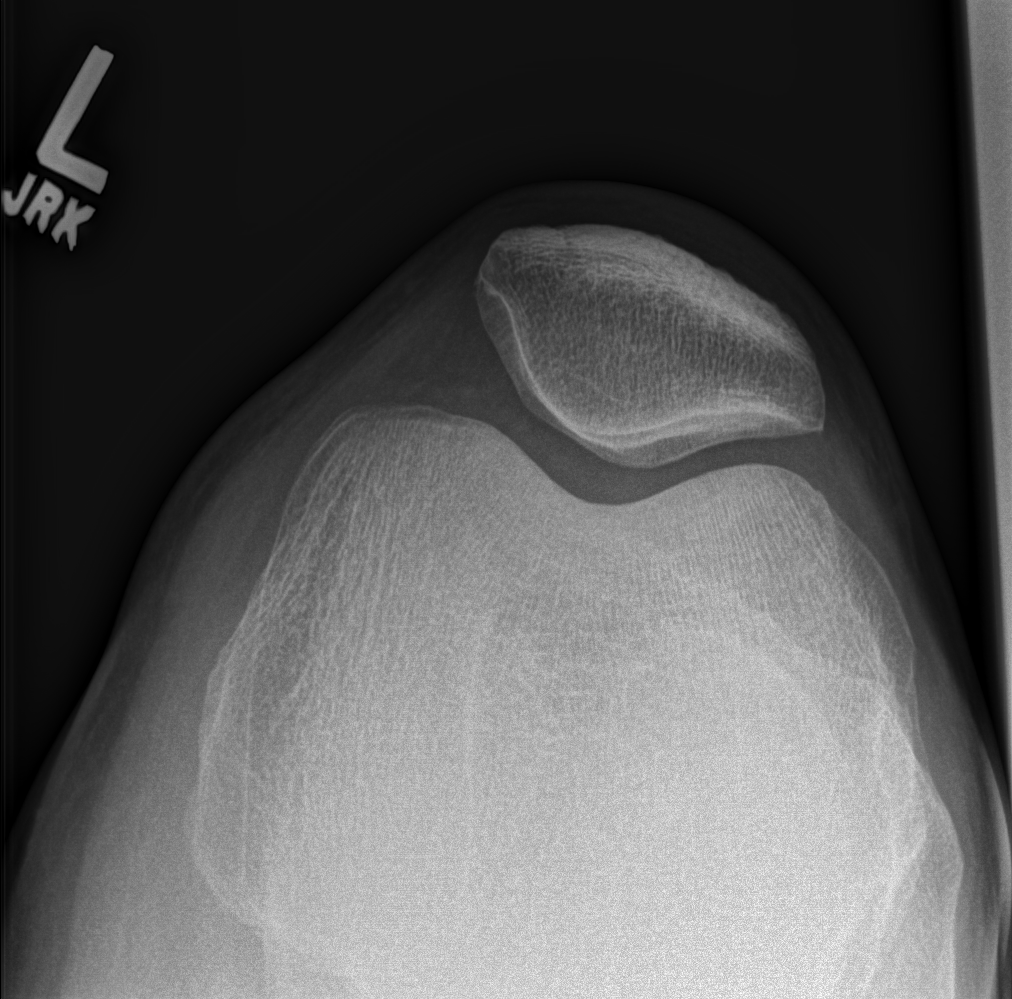

[4 of 4 positions shown; findings below may reference images not displayed]

FINDINGS: No fracture or dislocation. Mild degenerative change involving the
medial compartment of the knee with joint space loss and subchondral
sclerosis. No evidence of chondrocalcinosis. No joint effusion.
Regional soft tissues appear normal.
IMPRESSION: Mild degenerative change involving the medial compartment of the
knee.

## 2016-01-08 IMAGING — CR DG KNEE COMPLETE 4+V*R*
4 series · 4 of 4 positions shown · non-contrast
Comparison: [DATE]

CLINICAL DATA: Patient is having anterior knee pain that started 2
weeks ago with no known injury. He stands at his job all day on
concrete. He is hearing audible noise when bending and having a
grinding feeling. The right is worse than the left. No surgeries

EXAM:
RIGHT KNEE - COMPLETE 4+ VIEW

[w knee ap right]
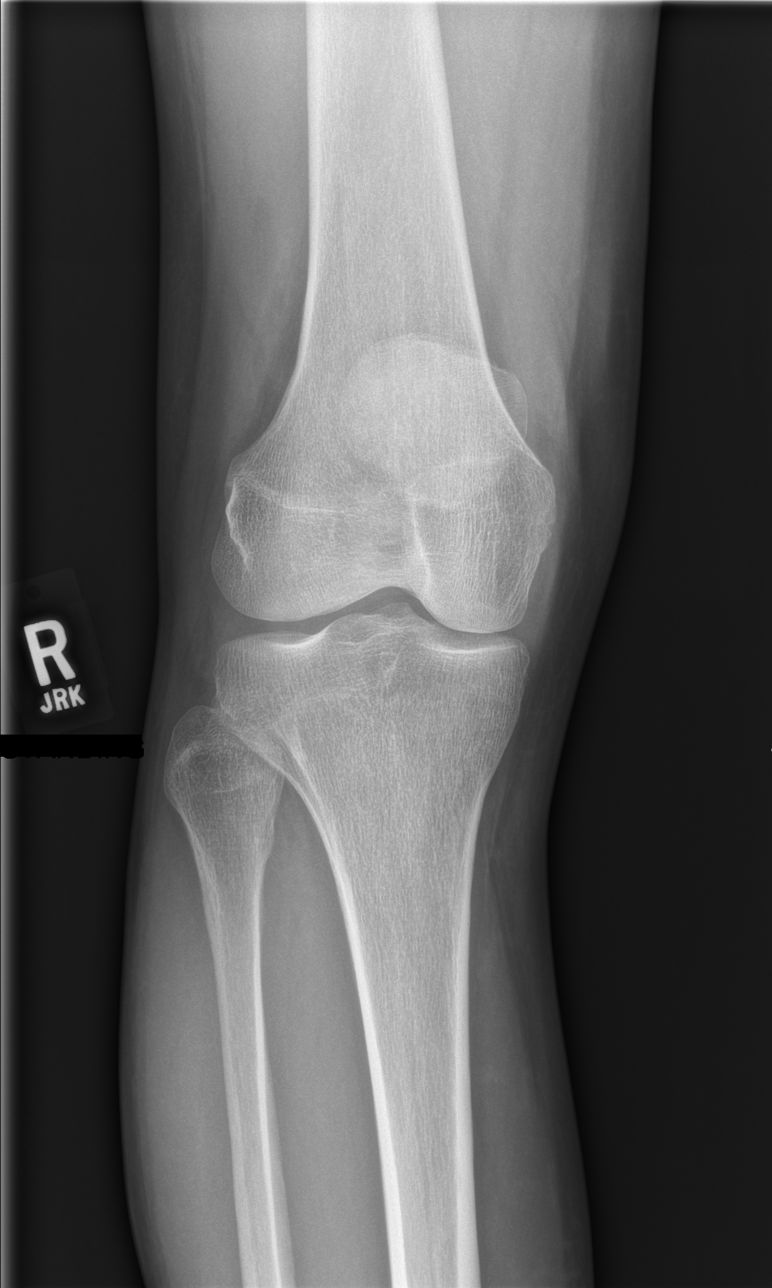

[w knee tunnel pa right]
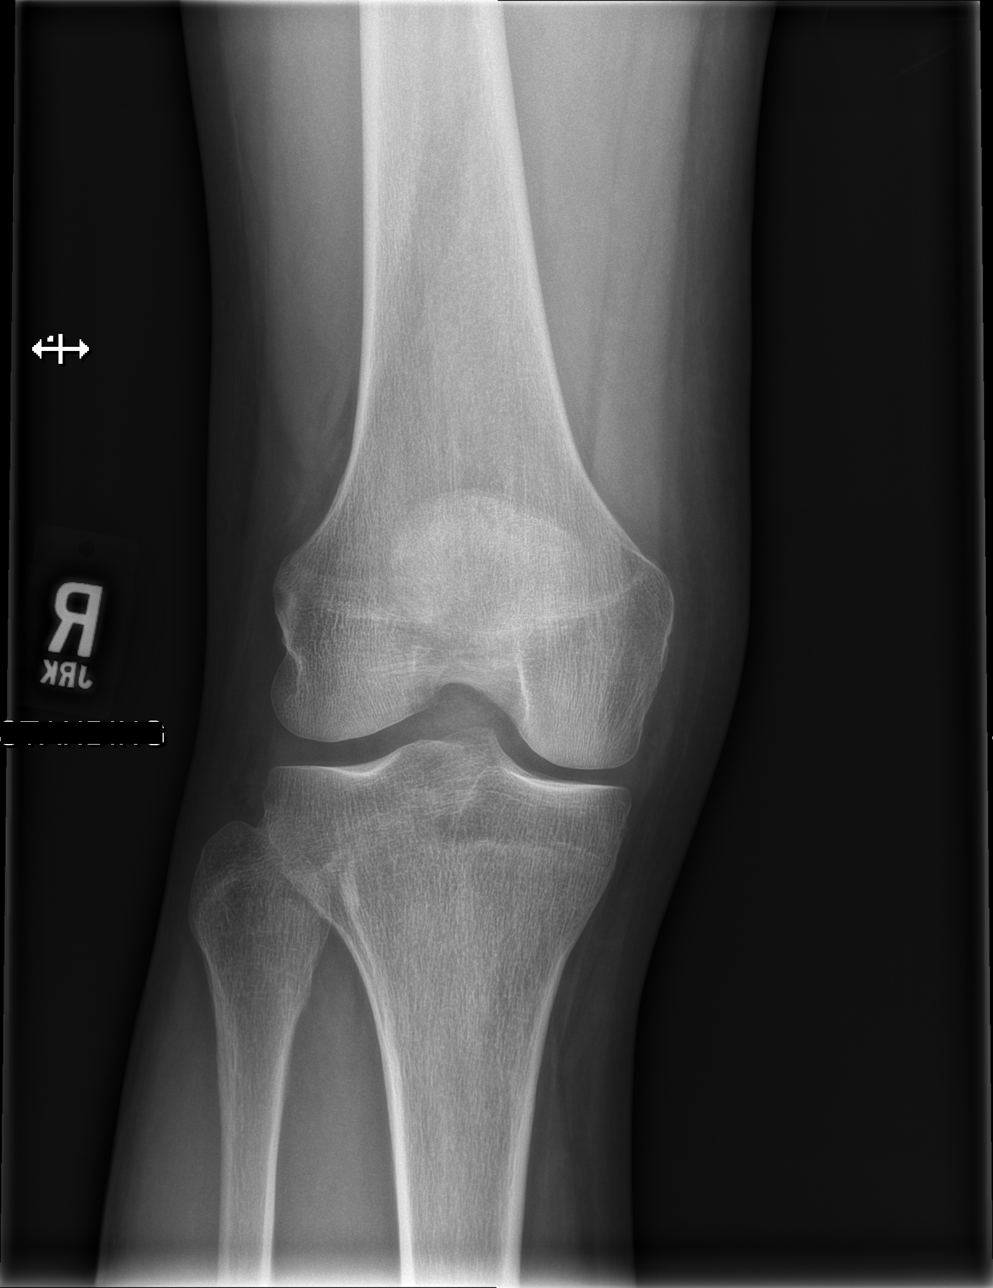

[w knee lat right]
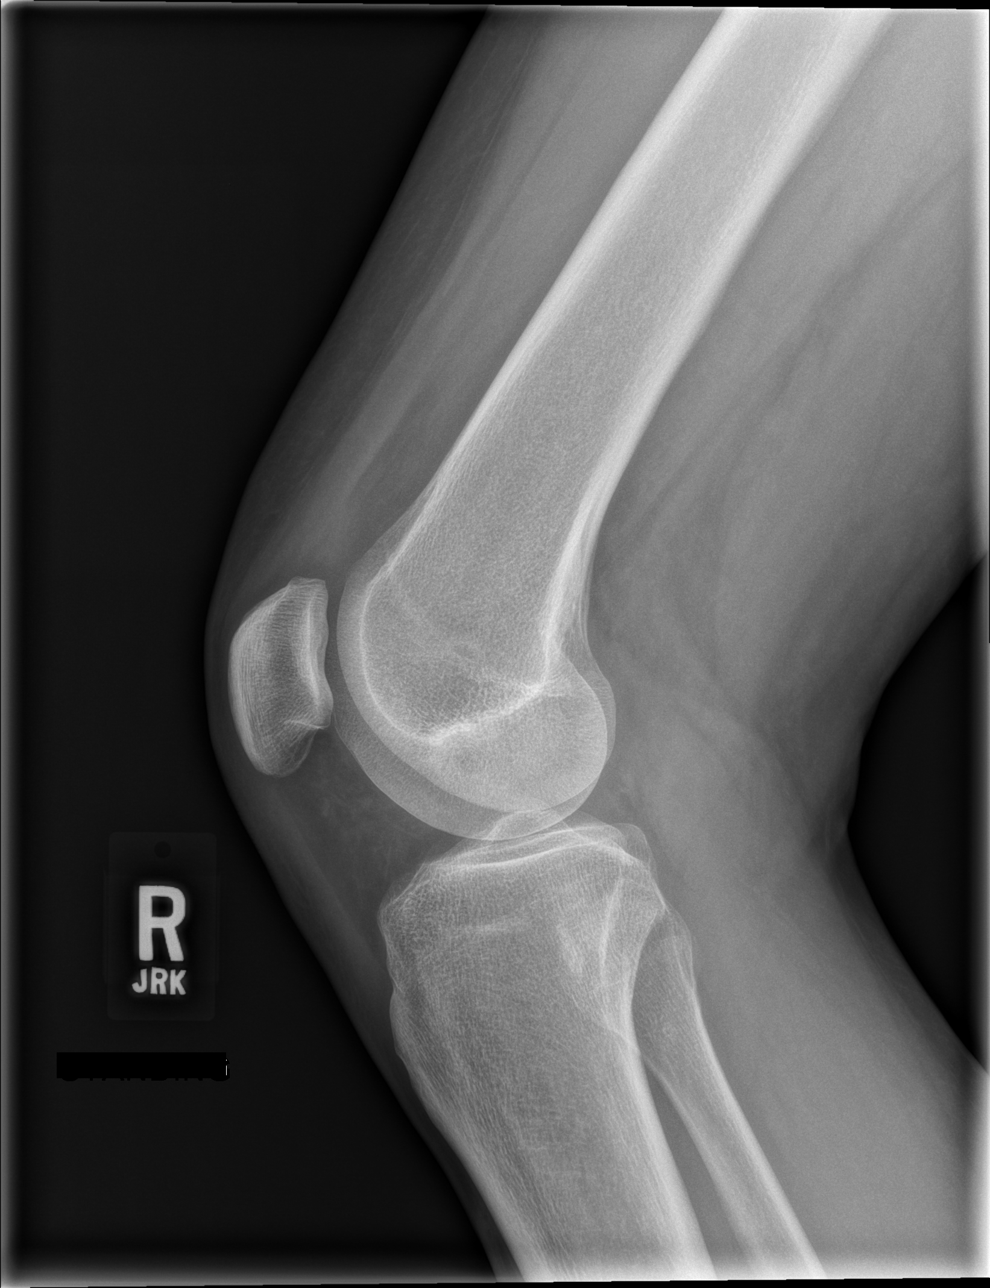

[x knee sunrise right]
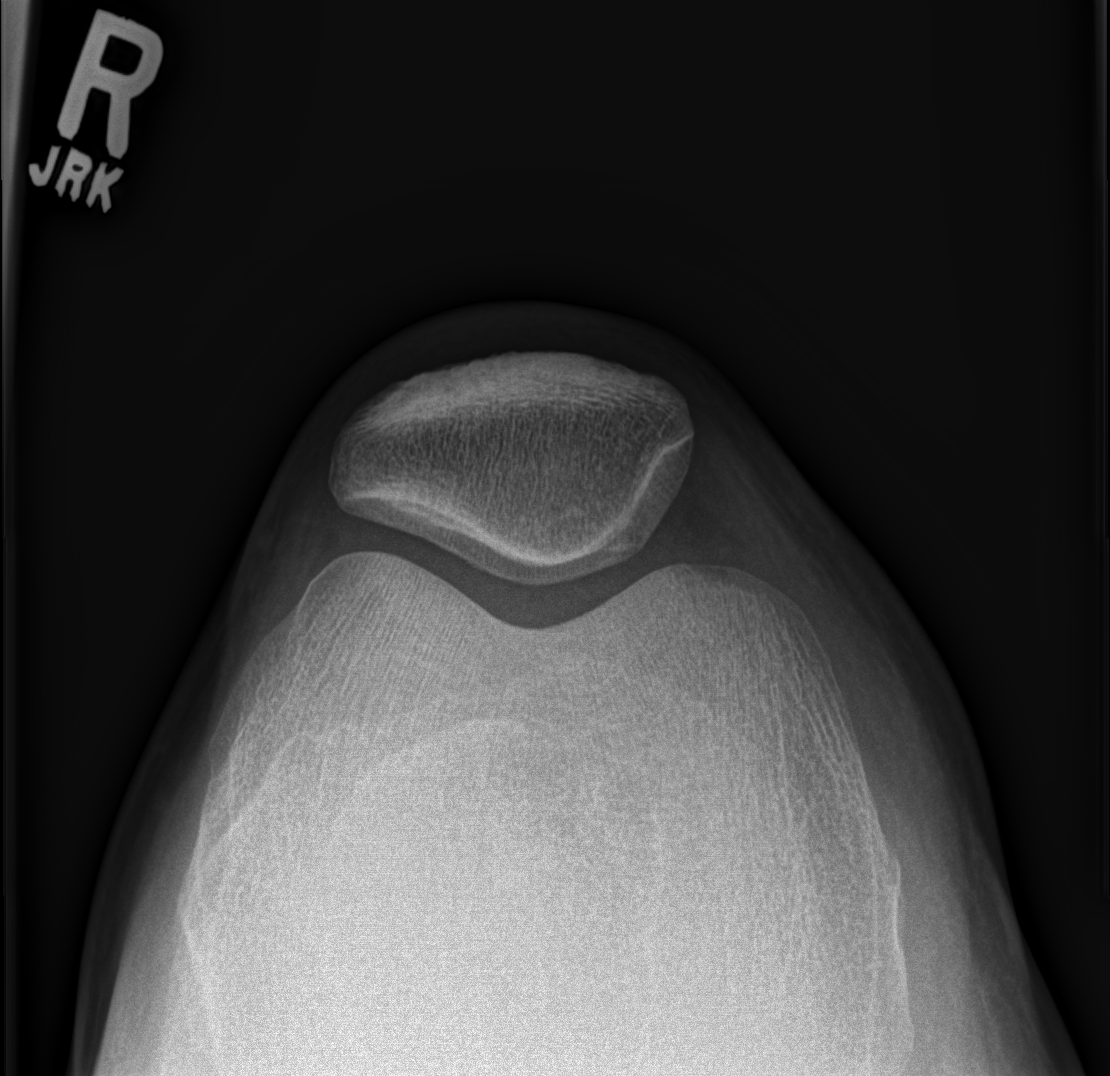

[4 of 4 positions shown; findings below may reference images not displayed]

FINDINGS: No acute fracture or dislocation. No joint effusion. Joint spaces
maintained.
IMPRESSION: No acute osseous abnormality.

## 2016-05-18 ENCOUNTER — Ambulatory Visit
Admission: RE | Admit: 2016-05-18 | Discharge: 2016-05-18 | Disposition: A | Discharge status: Home / Self Care | Payer: BLUE CROSS/BLUE SHIELD | Source: Ambulatory Visit | Attending: Family Medicine | Admitting: Family Medicine

## 2016-05-18 ENCOUNTER — Encounter: Payer: Self-pay | Admitting: Family Medicine

## 2016-05-18 ENCOUNTER — Ambulatory Visit (INDEPENDENT_AMBULATORY_CARE_PROVIDER_SITE_OTHER): Payer: BLUE CROSS/BLUE SHIELD | Admitting: Family Medicine

## 2016-05-18 VITALS — BP 122/80 | HR 77 | Ht 75.0 in | Wt 188.0 lb

## 2016-05-18 DIAGNOSIS — M25512 Pain in left shoulder: Secondary | ICD-10-CM
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
## 2016-05-18 NOTE — Patient Instructions (Signed)
Adhesive Capsulitis Adhesive capsulitis is inflammation of the tendons and ligaments that surround the shoulder joint (shoulder capsule). This condition causes the shoulder to become stiff and painful to move. Adhesive capsulitis is also called frozen shoulder. CAUSES This condition may be caused by:  An injury to the shoulder joint.  Straining the shoulder.  Not moving the shoulder for a period of time. This can happen if your arm was injured or in a sling.  Long-standing health problems, such as:  Diabetes.  Thyroid problems.  Heart disease.  Stroke.  Rheumatoid arthritis.  Lung disease. In some cases, the cause may not be known. RISK FACTORS This condition is more likely to develop in:  Women.  People who are older than 53 years of age. SYMPTOMS Symptoms of this condition include:  Pain in the shoulder when moving the arm. There may also be pain when parts of the shoulder are touched. The pain is worse at night or when at rest.  Soreness or aching in the shoulder.  Inability to move the shoulder normally.  Muscle spasms. DIAGNOSIS This condition is diagnosed with a physical exam and imaging tests, such as an X-ray or MRI. TREATMENT This condition may be treated with:  Treatment of the underlying cause or condition.  Physical therapy. This involves performing exercises to get the shoulder moving again.  Medicine. Medicine may be given to relieve pain, inflammation, or muscle spasms.  Steroid injections into the shoulder joint.  Shoulder manipulation. This is a procedure to move the shoulder into another position. It is done after you are given a medicine to make you fall asleep (general anesthetic). The joint may also be injected with salt water at high pressure to break down scarring.  Surgery. This may be done in severe cases when other treatments have failed. Although most people recover completely from adhesive capsulitis, some may not regain the full  movement of the shoulder. HOME CARE INSTRUCTIONS  Take over-the-counter and prescription medicines only as told by your health care provider.  If you are being treated with physical therapy, follow instructions from your physical therapist.  Avoid exercises that put a lot of demand on your shoulder, such as throwing. These exercises can make pain worse.  If directed, apply ice to the injured area:  Put ice in a plastic bag.  Place a towel between your skin and the bag.  Leave the ice on for 20 minutes, 2-3 times per day. SEEK MEDICAL CARE IF:  You develop new symptoms.  Your symptoms get worse.   This information is not intended to replace advice given to you by your health care provider. Make sure you discuss any questions you have with your health care provider.   Document Released: 07/17/2009 Document Revised: 06/10/2015 Document Reviewed: 01/12/2015 Elsevier Interactive Patient Education [employer].
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
## 2016-06-01 ENCOUNTER — Encounter: Payer: Self-pay | Admitting: Family Medicine

## 2016-06-01 ENCOUNTER — Ambulatory Visit (INDEPENDENT_AMBULATORY_CARE_PROVIDER_SITE_OTHER): Payer: BLUE CROSS/BLUE SHIELD | Admitting: Family Medicine

## 2016-06-01 ENCOUNTER — Other Ambulatory Visit: Payer: Self-pay

## 2016-06-01 ENCOUNTER — Encounter: Payer: Self-pay | Admitting: *Deleted

## 2016-06-01 VITALS — BP 132/84 | HR 70 | Ht 75.0 in | Wt 186.0 lb

## 2016-06-01 DIAGNOSIS — M25512 Pain in left shoulder: Secondary | ICD-10-CM

## 2016-06-01 DIAGNOSIS — M6520 Calcific tendinitis, unspecified site: Secondary | ICD-10-CM

## 2016-06-01 DIAGNOSIS — M7502 Adhesive capsulitis of left shoulder: Secondary | ICD-10-CM | POA: Diagnosis not present

## 2016-06-01 DIAGNOSIS — M7530 Calcific tendinitis of unspecified shoulder: Secondary | ICD-10-CM | POA: Insufficient documentation

## 2016-06-01 MED ORDER — DICLOFENAC SODIUM 2 % TD SOLN: 2.0000 "application " | Freq: Two times a day (BID) | TRANSDERMAL | 3 refills | Status: DC

## 2016-06-01 NOTE — Patient Instructions (Signed)
Good to see you.  Ice 20 minutes 2 times daily. Usually after activity and before bed. Exercises 3 times a week.  pennsaid pinkie amount topically 2 times daily as needed.  Keep hands within peripheral vision with lifting if you can.  Vitamin D 2000 IU daily can help See me again in 4 weeks if not better we will consider injection and maybe PT.

## 2016-06-01 NOTE — Assessment & Plan Note (Signed)
Patient does have what appears to be more of a calcific change in the bursitis. This could be more of a chronic issue. We discussed with patient about different treatment options and patient has elected try home exercises, icing protocol, topical anti-inflammatories. Patient will see if this makes any improvement and come back again in 4 weeks. Differential does include an adhesive capsulitis with thickening seen on ultrasound today as well as potential for a labral tear. We discussed if any worsening symptoms, weakness we may need to seek advanced imaging but I do believe the patient will do well overall.

## 2016-06-14 ENCOUNTER — Telehealth: Payer: Self-pay | Admitting: Family Medicine

## 2016-06-14 NOTE — Telephone Encounter (Signed)
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  He has several question about the injection.  Can you call him when you get a chance.  Pt can not answer phone while at work.  Call only after 4:30

## 2016-06-15 NOTE — Telephone Encounter (Signed)
lmovm for pt to return call.  

## 2016-06-17 NOTE — Telephone Encounter (Signed)
lmovm for pt to return call.  

## 2016-06-17 NOTE — Telephone Encounter (Signed)
Discussed with pt

## 2016-06-29 ENCOUNTER — Ambulatory Visit: Payer: BLUE CROSS/BLUE SHIELD | Admitting: Family Medicine

## 2016-06-29 ENCOUNTER — Encounter: Payer: Self-pay | Admitting: Family Medicine

## 2016-06-29 ENCOUNTER — Encounter: Payer: Self-pay | Admitting: *Deleted

## 2016-06-29 ENCOUNTER — Other Ambulatory Visit: Payer: Self-pay

## 2016-06-29 ENCOUNTER — Ambulatory Visit (INDEPENDENT_AMBULATORY_CARE_PROVIDER_SITE_OTHER): Payer: BLUE CROSS/BLUE SHIELD | Admitting: Family Medicine

## 2016-06-29 VITALS — BP 134/82 | HR 84 | Wt 184.0 lb

## 2016-06-29 DIAGNOSIS — M25512 Pain in left shoulder: Secondary | ICD-10-CM

## 2016-06-29 DIAGNOSIS — M7530 Calcific tendinitis of unspecified shoulder: Secondary | ICD-10-CM

## 2016-06-29 DIAGNOSIS — M7502 Adhesive capsulitis of left shoulder: Secondary | ICD-10-CM

## 2016-06-29 DIAGNOSIS — M6520 Calcific tendinitis, unspecified site: Secondary | ICD-10-CM
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
## 2016-06-29 NOTE — Patient Instructions (Addendum)
Good to see you  ICe in 6 hours Start the movements.  Out for 1 week at work See me again in 2-3 weeks.

## 2016-06-29 NOTE — Assessment & Plan Note (Signed)
Patient given injection today. Patient tolerated procedure well with near complete resolution of pain immediately. Discussed with patient continuing conservative therapy. We'll hold patient on a work for the next week. Patient come back and see me again in 2-3 weeks for further evaluation. Patient likely will do well with conservative therapy.
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
## 2016-07-20 ENCOUNTER — Ambulatory Visit (INDEPENDENT_AMBULATORY_CARE_PROVIDER_SITE_OTHER): Payer: BLUE CROSS/BLUE SHIELD | Admitting: Family Medicine

## 2016-07-20 ENCOUNTER — Encounter: Payer: Self-pay | Admitting: Family Medicine

## 2016-07-20 DIAGNOSIS — M7502 Adhesive capsulitis of left shoulder: Secondary | ICD-10-CM | POA: Diagnosis not present

## 2016-07-20 NOTE — Assessment & Plan Note (Signed)
Significantly improved with pain from previous exam. Discussed icing regimen and home exercises. Patient will remain active. Patient will come back and see me again in 4 weeks. If not completely improved we will consider repeat injection

## 2016-07-20 NOTE — Patient Instructions (Signed)
Ice still at night Pennsaid as needed now Keep working with the range of motion.  See me again In 4 weeks if not near perfect
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
## 2016-08-17 ENCOUNTER — Encounter: Payer: Self-pay | Admitting: Family Medicine

## 2016-08-17 ENCOUNTER — Ambulatory Visit (INDEPENDENT_AMBULATORY_CARE_PROVIDER_SITE_OTHER): Payer: BLUE CROSS/BLUE SHIELD | Admitting: Family Medicine

## 2016-08-17 ENCOUNTER — Encounter: Payer: Self-pay | Admitting: *Deleted

## 2016-08-17 DIAGNOSIS — M7502 Adhesive capsulitis of left shoulder: Secondary | ICD-10-CM

## 2016-08-17 MED ORDER — DICLOFENAC SODIUM 2 % TD SOLN: 2.0000 "application " | Freq: Two times a day (BID) | TRANSDERMAL | 3 refills | Status: DC

## 2016-08-17 NOTE — Assessment & Plan Note (Signed)
Patient was given another injection today. Was having worsening symptoms. Tolerated the procedure well. I'm hoping that patient will respond well to conservative therapy. Patient will otherwise will need advance imaging of her did not make significant improvement. Patient will be out of work for the next 5 days to try to decrease any type of irritation to the area. Hopefully after that patient will be able to respond to then treatment and below the work for 3 days before he goes on a 5 day holiday. Patient and will come back and see me again in 3-4 weeks for further evaluation and treatment.

## 2016-08-17 NOTE — Patient Instructions (Signed)
Good to see you  We will get you a note and I will turn in your paperwork  pennsaid pinkie amount topically 2 times daily as needed.  Injected the shoulder again  With the tricep tendon get a patella strap and wear it just above the elbow with work or activity for 2 weeks.  The shoulder should be much better but if not see me again in 3-4 weeks.

## 2016-08-18 ENCOUNTER — Other Ambulatory Visit: Payer: Self-pay | Admitting: *Deleted

## 2016-08-18 MED ORDER — DICLOFENAC SODIUM 1 % TD GEL: 2.0000 g | Freq: Four times a day (QID) | TRANSDERMAL | 0 refills | Status: DC

## 2016-08-18 NOTE — Telephone Encounter (Signed)
Sent diclofenac 1% into pharmacy.

## 2016-09-13 NOTE — Progress Notes (Signed)
[name] [address] [place], [place] [address] Phone: [phone] Subjective:     CC: Left shoulder pain f/u  [id number]  Jeffery [name] is a 55 y.o. male coming in with complaint of left shoulder pain. 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 Had a repeat injection 08/17/2016. As well as held out of work for one week. Patient states Significant better at this time. States that he is 95% as good as his contralateral side. Is working on a regular basis. Happy with the results of far.  Patient is having more of a right elbow pain. Seems to be more recently. Describes the pain as a dull, throbbing aching pain. Patient states it seems to be on the posterior aspect of the elbow. Mild discomfort but nothing severe overall. Patient is wondering what it is to see if he can avoid it from getting worse.  Patient did have x-rays taken 05/18/2016 that showed no significant bony abnormality. Independently visualized by me.  Past Medical History:  Diagnosis Date  . Central serous retinopathy    hx/o intraocular injection therapy for 3 years; no change after 3 years of therapy, no visual c/o  . Depression   . Former smoker   . Hypertension   . Left varicocele   . Port-wine stain of face   . Renal stones    Past Surgical History:  Procedure Laterality Date  . COLONOSCOPY     never  . polyp on uvula  2006   excision;    Social History   Social History  . Marital status: Married    Spouse name: N/A  . Number of children: N/A  . Years of education: N/A   Social History Main Topics  . Smoking status: Never Smoker  . Smokeless tobacco: Never Used  . Alcohol use 18.0 oz/week    30 Cans of beer per week  . Drug use: No  . Sexual activity: Not Asked   Other Topics Concern  . None   Social History Narrative   Divorced 04/2012, 2 children ages daughter 35yo, son 9yo; exercise - moving on the job, walking, works as Electrical engineer   Allergies    Allergen Reactions  . Penicillins Other (See Comments)    Hands & feet peel. "childhood allergy"   Family History  Problem Relation Age of Onset  . Heart disease Mother     pacemaker  . Heart disease Father 100    died of MI, 11s  . Diabetes Father   . Stroke Father   . Migraines Sister   . Mental illness Sister   . Cancer Neg Hx   . Hyperlipidemia Neg Hx   . Hypertension Neg Hx   . Colon cancer Neg Hx     Past medical history, social, surgical and family history all reviewed in electronic medical record.  No pertanent information unless stated regarding to the chief complaint.   Review of Systems: No headache, visual changes, nausea, vomiting, diarrhea, constipation, dizziness, abdominal pain, skin rash, fevers, chills, night sweats, weight loss, swollen lymph nodes, body aches, joint swelling, muscle aches, chest pain, shortness of breath, mood changes.    Objective  Blood pressure (!) 148/100, pulse 96, height 6\' 3"  (1.905 m), weight 189 lb (85.7 kg).  Systems examined below as of 09/14/16 General: NAD A&O x3 mood, affect normal  HEENT: Pupils equal, extraocular movements intact no nystagmus Respiratory: not short of breath at rest or with speaking Cardiovascular: No lower extremity edema,  non tender Skin: Warm dry intact with no signs of infection or rash on extremities or on axial skeleton. Abdomen: Soft nontender, no masses Neuro: Cranial nerves  intact, neurovascularly intact in all extremities with 2+ DTRs and 2+ pulses. Lymph: No lymphadenopathy appreciated today  Gait normal with good balance and coordination.  MSK: Non tender with full range of motion and good stability and symmetric strength and tone of  wrist,  knee hips and ankles bilaterally.   Shoulder: left Inspection reveals no abnormalities, atrophy or asymmetry. Worsening pain improves exam Patient is restricted at 175 degrees. limited last 5 of external rotation and internal rotation to L2 Rotator cuff  strength normal throughout. No impingement signs Speeds and Yergason's tests normal. No labral pathology noted with negative Obrien's, negative clunk and good stability. Normal scapular function observed. No painful arc and no drop arm sign. No apprehension sign Contralateral shoulder unremarkable Improvement in range of motion from previous exam. Nontender on exam.  Elbow: Right Unremarkable to inspection. Range of motion full pronation, supination, flexion, extension. Strength is full to all of the above directions Stable to varus, valgus stress. Negative moving valgus stress test. Tender at the insertion of the tricep tendon on the olecranon. Full strength though noted. Ulnar nerve does not sublux. Negative cubital tunnel Tinel's. Contralateral elbow unremarkable  Musculoskeletal ultrasound was performed and interpreted by Charlann Boxer D.O.   Elbow: Right Limited study shows the patient stress of tendon does have some mild hypoechoic changes and increasing Doppler flow right at the insertion. No true tear.   IMPRESSION:  Triceps tendinitis     Impression and Recommendations:     This case required medical decision making of moderate complexity.      Note: This dictation was prepared with Dragon dictation along with smaller phrase technology. Any transcriptional errors that result from this process are unintentional.

## 2016-09-14 ENCOUNTER — Ambulatory Visit: Payer: Self-pay

## 2016-09-14 ENCOUNTER — Encounter: Payer: Self-pay | Admitting: Family Medicine

## 2016-09-14 ENCOUNTER — Encounter: Payer: Self-pay | Admitting: Medical

## 2016-09-14 ENCOUNTER — Ambulatory Visit (INDEPENDENT_AMBULATORY_CARE_PROVIDER_SITE_OTHER): Payer: BLUE CROSS/BLUE SHIELD | Admitting: Medical

## 2016-09-14 ENCOUNTER — Encounter: Payer: Self-pay | Admitting: *Deleted

## 2016-09-14 ENCOUNTER — Ambulatory Visit (INDEPENDENT_AMBULATORY_CARE_PROVIDER_SITE_OTHER): Payer: BLUE CROSS/BLUE SHIELD | Admitting: Family Medicine

## 2016-09-14 VITALS — BP 126/88 | HR 103 | Temp 98.3°F | Wt 184.0 lb

## 2016-09-14 VITALS — BP 148/100 | HR 96 | Ht 75.0 in | Wt 189.0 lb

## 2016-09-14 DIAGNOSIS — M25521 Pain in right elbow: Secondary | ICD-10-CM | POA: Diagnosis not present

## 2016-09-14 DIAGNOSIS — J029 Acute pharyngitis, unspecified: Secondary | ICD-10-CM

## 2016-09-14 DIAGNOSIS — M778 Other enthesopathies, not elsewhere classified: Secondary | ICD-10-CM | POA: Diagnosis not present

## 2016-09-14 DIAGNOSIS — L989 Disorder of the skin and subcutaneous tissue, unspecified: Secondary | ICD-10-CM | POA: Diagnosis not present

## 2016-09-14 DIAGNOSIS — M7502 Adhesive capsulitis of left shoulder: Secondary | ICD-10-CM

## 2016-09-14 DIAGNOSIS — R05 Cough: Secondary | ICD-10-CM | POA: Diagnosis not present

## 2016-09-14 DIAGNOSIS — R059 Cough, unspecified: Secondary | ICD-10-CM

## 2016-09-14 DIAGNOSIS — M779 Enthesopathy, unspecified: Secondary | ICD-10-CM

## 2016-09-14 LAB — POCT RAPID STREP A (OFFICE): RAPID STREP A SCREEN: NEGATIVE

## 2016-09-14 NOTE — Patient Instructions (Addendum)
Good to see you  Ice is your friend Keep using the shoulder should be good to go in a bout a month fully  The elbow pennsaid pinkie amount topically 2 times daily as needed.   Try not to push so hard with thew right hand.  Wear the strap still until pain resolves.  If worsening see me again [employer] orth[employer] "total support" online would be great  Do not lace last eye on the boot Happy holidays!

## 2016-09-14 NOTE — Assessment & Plan Note (Signed)
New problem. Discussed with patient at great length. Seems mild to moderate nature. Patient will do a topical anti-inflammatories, icing regimen. Patient is to do a compression brace. Patient and will follow-up in 4 weeks. Worsening symptoms we'll consider further treatment options.

## 2016-09-14 NOTE — Patient Instructions (Signed)
Recommendations:  Rest  Hydrate well with water  Continue Advil 2-3 tablets OTC twice daily for a few day for aches, fever, sore throat  Use salt water gargles, warm fluids for sore throat  Use benadryl OTC for drippy nose  Consider Afrin nasal spray for a few days for runny nose  You should gradually improve in the next few days  We will refer you to dermatology

## 2016-09-14 NOTE — Assessment & Plan Note (Signed)
Patient is doing very well at this time. Patient as well as start doing increasing activity. Patient seems to do well and likely will resolve completely in the next 4-6 weeks. No significant change in management.

## 2016-09-14 NOTE — Progress Notes (Signed)
Subjective:  Jeffery [name] is a 55 y.o. male who presents for respiratory illness.  Started Sunday 4 days ago somewhat abruptly with sore throat, congestion, headaches, lots and lots of runny nose, body aches, chills, fever subjective.  No SOB, no wheezing,no NVD.  Using Advil which is keeping the fever controlled.  No sick contacts.  Didn't get flu shot this year.  Nonsmoker.  No other aggravating or relieving factors. No other complaint.  Past Medical History:  Diagnosis Date  . Central serous retinopathy    hx/o intraocular injection therapy for 3 years; no change after 3 years of therapy, no visual c/o  . Depression   . Former smoker   . Hypertension   . Left varicocele   . Port-wine stain of face   . Renal stones    Current Outpatient Prescriptions on File Prior to Visit  Medication Sig Dispense Refill  . amLODipine (NORVASC) 5 MG tablet Take 1 tablet (5 mg total) by mouth daily. 90 tablet 3  . ibuprofen (ADVIL,MOTRIN) 200 MG tablet Take 800 mg by mouth as directed.     [name] lisinopril-hydrochlorothiazide (PRINZIDE,ZESTORETIC) 20-12.5 MG tablet TAKE 1 TABLET BY MOUTH DAILY. (Patient not taking: Reported on 09/14/2016) 90 tablet 3   No current facility-administered medications on file prior to visit.     ROS as in subjective   Objective: BP 126/88   Pulse (!) 103   Temp 98.3 F (36.8 C)   Wt 184 lb (83.5 kg)   SpO2 98%   BMI 23.00 kg/m   General appearance: Alert, WD/WN, no distress, somewhat ill appearing                             Skin: warm, no rash, right face inferior to orbit with 3 raised papular lesions, each somewhat pearly, some vascularity, port wine stain right ear, temporal region of face.                           Head: no sinus tenderness                            Eyes: conjunctiva normal, corneas clear, PERRLA                            Ears: pearly TMs, external ear canals normal                          Nose: septum midline, turbinates swollen,  with erythema and clear discharge             Mouth/throat: MMM, tongue normal, moderate pharyngeal erythema                           Neck: supple, no adenopathy, no thyromegaly, non tender                          Heart: RRR, normal S1, S2, no murmurs                         Lungs: CTA bilaterally, no wheezes, rales, or rhonchi      Assessment  Encounter Diagnoses  Name Primary?  . Sore throat Yes  .  Cough   . Changing skin lesion       Plan: Strep negative.  Symptoms suggestive of flu like illness.  He is improving and on day 3, so I expect improvement in the next few days.     Patient Instructions  Recommendations:  Rest  Hydrate well with water  Continue Advil 2-3 tablets OTC twice daily for a few day for aches, fever, sore throat  Use salt water gargles, warm fluids for sore throat  Use benadryl OTC for drippy nose  Consider Afrin nasal spray for a few days for runny nose  You should gradually improve in the next few days  We will refer you to dermatology  Patient was advised to call or return if worse or not improving in the next few days.    Patient voiced understanding of diagnosis, recommendations, and treatment plan.  Skin lesions - Refer to dermatology for skin lesions, surveillance

## 2016-11-07 ENCOUNTER — Other Ambulatory Visit: Payer: Self-pay | Admitting: Family Medicine

## 2016-11-07 DIAGNOSIS — I10 Essential (primary) hypertension: Secondary | ICD-10-CM

## 2017-02-04 ENCOUNTER — Other Ambulatory Visit: Payer: Self-pay | Admitting: Family Medicine

## 2017-02-04 DIAGNOSIS — I10 Essential (primary) hypertension: Secondary | ICD-10-CM

## 2017-03-20 ENCOUNTER — Ambulatory Visit (INDEPENDENT_AMBULATORY_CARE_PROVIDER_SITE_OTHER): Payer: BLUE CROSS/BLUE SHIELD | Admitting: Family Medicine

## 2017-03-20 VITALS — BP 118/72 | HR 80 | Wt 178.0 lb

## 2017-03-20 DIAGNOSIS — T25221A Burn of second degree of right foot, initial encounter: Secondary | ICD-10-CM | POA: Diagnosis not present

## 2017-03-20 MED ORDER — SILVER SULFADIAZINE 1 % EX CREA: 1.0000 "application " | TOPICAL_CREAM | Freq: Every day | CUTANEOUS | 0 refills | Status: DC
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
## 2017-03-23 ENCOUNTER — Ambulatory Visit (INDEPENDENT_AMBULATORY_CARE_PROVIDER_SITE_OTHER): Payer: BLUE CROSS/BLUE SHIELD | Admitting: Family Medicine

## 2017-03-23 ENCOUNTER — Other Ambulatory Visit: Payer: Self-pay

## 2017-03-23 VITALS — BP 120/80 | Wt 178.0 lb

## 2017-03-23 DIAGNOSIS — T25221A Burn of second degree of right foot, initial encounter: Secondary | ICD-10-CM

## 2017-03-23 MED ORDER — SILVER SULFADIAZINE 1 % EX CREA: 1.0000 "application " | TOPICAL_CREAM | Freq: Every day | CUTANEOUS | 1 refills | Status: DC
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
## 2017-03-23 NOTE — Addendum Note (Signed)
Addended by: Arley Phenix L on: 03/23/2017 12:08 PM   Modules accepted: Orders

## 2017-03-27 ENCOUNTER — Encounter: Payer: Self-pay | Admitting: Family Medicine

## 2017-03-27 ENCOUNTER — Ambulatory Visit (INDEPENDENT_AMBULATORY_CARE_PROVIDER_SITE_OTHER): Payer: BLUE CROSS/BLUE SHIELD | Admitting: Family Medicine

## 2017-03-27 VITALS — BP 112/74 | HR 64 | Temp 98.5°F | Ht 75.0 in | Wt 176.0 lb

## 2017-03-27 DIAGNOSIS — T25221D Burn of second degree of right foot, subsequent encounter: Secondary | ICD-10-CM | POA: Diagnosis not present

## 2017-03-27 NOTE — Patient Instructions (Signed)
  Continue to use the silvadene and your cleaning measures. Wound looks clean, no evidence of infection. Return for re-evaluation if increasing redness, pain, swelling, fever. Okay to allow some time where the foot is open to air. Over the next few days/week, try and cut back on the bandaging so it isn't as thick, and try wearing a shoe. See how you feel with frequent breaks where you elevate the foot (while in a shoe). Remain out of work this week. Recheck with Dr. Redmond School next week.  By trying these measures, it will give [place] a better idea of what to recommend for the upcoming weeks.
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
## 2017-04-03 ENCOUNTER — Encounter: Payer: Self-pay | Admitting: Family Medicine

## 2017-04-03 ENCOUNTER — Ambulatory Visit (INDEPENDENT_AMBULATORY_CARE_PROVIDER_SITE_OTHER): Payer: BLUE CROSS/BLUE SHIELD | Admitting: Family Medicine

## 2017-04-03 VITALS — BP 124/80 | HR 95 | Ht 75.0 in | Wt 181.0 lb

## 2017-04-03 DIAGNOSIS — T25221D Burn of second degree of right foot, subsequent encounter: Secondary | ICD-10-CM
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
## 2017-04-10 ENCOUNTER — Telehealth: Payer: Self-pay | Admitting: Family Medicine

## 2017-04-10 NOTE — Telephone Encounter (Signed)
Pt is throwing up and want to know what he can do. He started getting an upset stomach Saturday night about 11:00 pm. Yesterday morning he started throwing up about every hour or so no matter what he ate or drank. No food or liquid stayed down at all. This morning at 6:00am pt took his BP pill with a sip of liquid but he threw it up an hour later. He is concerned about dehydrating.

## 2017-04-10 NOTE — Telephone Encounter (Signed)
Have him try Emetrol first and if that doesn't work you can call in the ondansetron 4 mg. #6

## 2017-04-10 NOTE — Telephone Encounter (Signed)
Pt informed and verbalized understanding

## 2017-04-12 ENCOUNTER — Encounter: Payer: Self-pay | Admitting: Family Medicine

## 2017-04-12 ENCOUNTER — Ambulatory Visit (INDEPENDENT_AMBULATORY_CARE_PROVIDER_SITE_OTHER): Payer: BLUE CROSS/BLUE SHIELD | Admitting: Family Medicine

## 2017-04-12 VITALS — BP 130/80 | HR 80 | Temp 98.2°F | Wt 178.0 lb

## 2017-04-12 DIAGNOSIS — N410 Acute prostatitis: Secondary | ICD-10-CM | POA: Diagnosis not present

## 2017-04-12 DIAGNOSIS — N390 Urinary tract infection, site not specified: Secondary | ICD-10-CM | POA: Diagnosis not present

## 2017-04-12 DIAGNOSIS — R319 Hematuria, unspecified: Secondary | ICD-10-CM

## 2017-04-12 LAB — POCT URINALYSIS DIP (PROADVANTAGE DEVICE)
Bilirubin, UA: NEGATIVE
Glucose, UA: NEGATIVE mg/dL
Ketones, POC UA: NEGATIVE mg/dL
Nitrite, UA: POSITIVE — AB
PH UA: 6 (ref 5.0–8.0)
Specific Gravity, Urine: 1.02
UUROB: NEGATIVE

## 2017-04-12 MED ORDER — CIPROFLOXACIN HCL 500 MG PO TABS: 500.0000 mg | ORAL_TABLET | Freq: Two times a day (BID) | ORAL | 0 refills | Status: DC

## 2017-04-12 NOTE — Progress Notes (Signed)
   Subjective:    Patient ID: Jeffery [name], male    DOB: 30-Jul-1961, 56 y.o.   MRN: [id number]  HPI 4 days ago he developed some low abdominal pain, nausea, vomiting and also notes some slight low back pain with some nocturia but not having difficulty during the day. He's had no fever, chills, dysuria, urgency, frequency. Yesterday he noted frank blood in his urine.   Review of Systems     Objective:   Physical Exam Alert and in no no masses orness. Lowella Fairy shows normal circumcised male.Rectal exam does show a boggy but nontender prostate. Urine was grossly bloody with dipstick positive for nitrite and white cells as well as blood.       Assessment & Plan:  Hematuria, unspecified type - Plan: POCT Urinalysis DIP (Proadvantage Device), CULTURE, URINE COMPREHENSIVE, Ambulatory referral to Urology, ciprofloxacin (CIPRO) 500 MG tablet  Urinary tract infection with hematuria, site unspecified - Plan: CULTURE, URINE COMPREHENSIVE, Ambulatory referral to Urology, ciprofloxacin (CIPRO) 500 MG tablet  Acute prostatitis - Plan: CULTURE, URINE COMPREHENSIVE, Ambulatory referral to Urology, ciprofloxacin (CIPRO) 500 MG tablet I explained to the patient and usually don't gens and because of this,opriate.

## 2017-04-13 ENCOUNTER — Encounter: Payer: Self-pay | Admitting: Family Medicine

## 2017-04-13 ENCOUNTER — Ambulatory Visit (INDEPENDENT_AMBULATORY_CARE_PROVIDER_SITE_OTHER): Payer: BLUE CROSS/BLUE SHIELD | Admitting: Family Medicine

## 2017-04-13 VITALS — BP 120/80 | HR 76 | Temp 98.7°F | Wt 179.0 lb

## 2017-04-13 DIAGNOSIS — R238 Other skin changes: Secondary | ICD-10-CM

## 2017-04-13 DIAGNOSIS — R233 Spontaneous ecchymoses: Secondary | ICD-10-CM

## 2017-04-13 DIAGNOSIS — K068 Other specified disorders of gingiva and edentulous alveolar ridge: Secondary | ICD-10-CM | POA: Diagnosis not present

## 2017-04-13 LAB — CBC WITH DIFFERENTIAL/PLATELET
Basophils Absolute: 0 cells/uL (ref 0–200)
Basophils Relative: 0 %
EOS PCT: 2 %
Eosinophils Absolute: 144 cells/uL (ref 15–500)
HCT: 32.9 % — ABNORMAL LOW (ref 38.5–50.0)
HEMOGLOBIN: 11.7 g/dL — AB (ref 13.2–17.1)
LYMPHS ABS: 1008 {cells}/uL (ref 850–3900)
Lymphocytes Relative: 14 %
MCH: 31.9 pg (ref 27.0–33.0)
MCHC: 35.6 g/dL (ref 32.0–36.0)
MCV: 89.6 fL (ref 80.0–100.0)
MPV: 8.7 fL (ref 7.5–12.5)
Monocytes Absolute: 1296 cells/uL — ABNORMAL HIGH (ref 200–950)
Monocytes Relative: 18 %
NEUTROS ABS: 4752 {cells}/uL (ref 1500–7800)
Neutrophils Relative %: 66 %
Platelets: 251 10*3/uL (ref 140–400)
RBC: 3.67 MIL/uL — AB (ref 4.20–5.80)
RDW: 11.9 % (ref 11.0–15.0)
WBC: 7.2 10*3/uL (ref 4.0–10.5)

## 2017-04-13 LAB — COMPREHENSIVE METABOLIC PANEL
ALBUMIN: 4.2 g/dL (ref 3.6–5.1)
ALT: 14 U/L (ref 9–46)
AST: 14 U/L (ref 10–35)
Alkaline Phosphatase: 78 U/L (ref 40–115)
BILIRUBIN TOTAL: 0.7 mg/dL (ref 0.2–1.2)
BUN: 15 mg/dL (ref 7–25)
CO2: 28 mmol/L (ref 20–31)
CREATININE: 1 mg/dL (ref 0.70–1.33)
Calcium: 9.1 mg/dL (ref 8.6–10.3)
Chloride: 100 mmol/L (ref 98–110)
Glucose, Bld: 105 mg/dL — ABNORMAL HIGH (ref 65–99)
Potassium: 3.3 mmol/L — ABNORMAL LOW (ref 3.5–5.3)
SODIUM: 134 mmol/L — AB (ref 135–146)
Total Protein: 6.3 g/dL (ref 6.1–8.1)

## 2017-04-13 NOTE — Progress Notes (Signed)
   Subjective:    Patient ID: Jeffery [name], male    DOB: [birth year], 56 y.o.   MRN: [id number]  HPI  He is here for recheck. He states that last night he noted increased difficulty with hematuria as well as blood clots and then also noted a rash on his ankles and easy bruising. This morning he noted bleeding from his gums. Also complains of diffuse arthralgias He has gotten 2 doses of Cipro.  Review of Systems     Objective:   Physical Exam Alert and in no distress. Evidence of recent bleeding from the anterior gums is noted. Several ecchymotic areas are noted on his legs. Also petechiae are noted on his lower extremities but seemed to not affect his feet. Conjunctiva normal.       Assessment & Plan:  Easy bruisability - Plan: CBC with Differential/Platelet, Comprehensive metabolic panel  Petechiae - Plan: CBC with Differential/Platelet, Comprehensive metabolic panel  Gums, bleeding - Plan: CBC with Differential/Platelet, Comprehensive metabolic panel His blood work did come back normal. I explained this to him. Told him I did not think the medication was doing it and there was no evidence of any major underlying medical problem. We will continue to be vigilant on this and check back with him on Monday.

## 2017-04-17 ENCOUNTER — Inpatient Hospital Stay (HOSPITAL_COMMUNITY)
Admission: EM | Admit: 2017-04-17 | Discharge: 2017-04-21 | DRG: 813 | Disposition: A | Discharge status: Home / Self Care | Payer: BLUE CROSS/BLUE SHIELD | Attending: Internal Medicine | Admitting: Internal Medicine

## 2017-04-17 ENCOUNTER — Encounter: Payer: Self-pay | Admitting: Family Medicine

## 2017-04-17 ENCOUNTER — Inpatient Hospital Stay (HOSPITAL_COMMUNITY): Payer: BLUE CROSS/BLUE SHIELD

## 2017-04-17 ENCOUNTER — Encounter (HOSPITAL_COMMUNITY): Payer: Self-pay | Admitting: General Practice

## 2017-04-17 ENCOUNTER — Ambulatory Visit (INDEPENDENT_AMBULATORY_CARE_PROVIDER_SITE_OTHER): Payer: BLUE CROSS/BLUE SHIELD | Admitting: Family Medicine

## 2017-04-17 ENCOUNTER — Other Ambulatory Visit: Payer: Self-pay | Admitting: Oncology

## 2017-04-17 VITALS — BP 100/40 | HR 138 | Wt 182.0 lb

## 2017-04-17 DIAGNOSIS — Z9289 Personal history of other medical treatment: Secondary | ICD-10-CM

## 2017-04-17 DIAGNOSIS — K068 Other specified disorders of gingiva and edentulous alveolar ridge: Secondary | ICD-10-CM | POA: Diagnosis present

## 2017-04-17 DIAGNOSIS — Z791 Long term (current) use of non-steroidal anti-inflammatories (NSAID): Secondary | ICD-10-CM

## 2017-04-17 DIAGNOSIS — D649 Anemia, unspecified: Secondary | ICD-10-CM | POA: Diagnosis not present

## 2017-04-17 DIAGNOSIS — Z820 Family history of epilepsy and other diseases of the nervous system: Secondary | ICD-10-CM | POA: Diagnosis not present

## 2017-04-17 DIAGNOSIS — D689 Coagulation defect, unspecified: Secondary | ICD-10-CM | POA: Diagnosis present

## 2017-04-17 DIAGNOSIS — R791 Abnormal coagulation profile: Secondary | ICD-10-CM | POA: Diagnosis not present

## 2017-04-17 DIAGNOSIS — R58 Hemorrhage, not elsewhere classified: Secondary | ICD-10-CM | POA: Diagnosis present

## 2017-04-17 DIAGNOSIS — Z87442 Personal history of urinary calculi: Secondary | ICD-10-CM

## 2017-04-17 DIAGNOSIS — N1330 Unspecified hydronephrosis: Secondary | ICD-10-CM | POA: Diagnosis not present

## 2017-04-17 DIAGNOSIS — R7303 Prediabetes: Secondary | ICD-10-CM | POA: Diagnosis present

## 2017-04-17 DIAGNOSIS — Z8249 Family history of ischemic heart disease and other diseases of the circulatory system: Secondary | ICD-10-CM

## 2017-04-17 DIAGNOSIS — F329 Major depressive disorder, single episode, unspecified: Secondary | ICD-10-CM | POA: Diagnosis present

## 2017-04-17 DIAGNOSIS — R310 Gross hematuria: Secondary | ICD-10-CM | POA: Diagnosis present

## 2017-04-17 DIAGNOSIS — I861 Scrotal varices: Secondary | ICD-10-CM | POA: Diagnosis present

## 2017-04-17 DIAGNOSIS — Z818 Family history of other mental and behavioral disorders: Secondary | ICD-10-CM | POA: Diagnosis not present

## 2017-04-17 DIAGNOSIS — E871 Hypo-osmolality and hyponatremia: Secondary | ICD-10-CM | POA: Diagnosis present

## 2017-04-17 DIAGNOSIS — Z79899 Other long term (current) drug therapy: Secondary | ICD-10-CM | POA: Diagnosis not present

## 2017-04-17 DIAGNOSIS — Q825 Congenital non-neoplastic nevus: Secondary | ICD-10-CM | POA: Diagnosis not present

## 2017-04-17 DIAGNOSIS — Z87891 Personal history of nicotine dependence: Secondary | ICD-10-CM | POA: Diagnosis not present

## 2017-04-17 DIAGNOSIS — R600 Localized edema: Secondary | ICD-10-CM | POA: Diagnosis present

## 2017-04-17 DIAGNOSIS — Z880 Allergy status to penicillin: Secondary | ICD-10-CM | POA: Diagnosis not present

## 2017-04-17 DIAGNOSIS — Z823 Family history of stroke: Secondary | ICD-10-CM | POA: Diagnosis not present

## 2017-04-17 DIAGNOSIS — R0609 Other forms of dyspnea: Secondary | ICD-10-CM | POA: Diagnosis present

## 2017-04-17 DIAGNOSIS — R233 Spontaneous ecchymoses: Secondary | ICD-10-CM | POA: Diagnosis present

## 2017-04-17 DIAGNOSIS — Z833 Family history of diabetes mellitus: Secondary | ICD-10-CM | POA: Diagnosis not present

## 2017-04-17 DIAGNOSIS — R319 Hematuria, unspecified: Secondary | ICD-10-CM

## 2017-04-17 DIAGNOSIS — I10 Essential (primary) hypertension: Secondary | ICD-10-CM | POA: Diagnosis not present

## 2017-04-17 DIAGNOSIS — R1010 Upper abdominal pain, unspecified: Secondary | ICD-10-CM | POA: Diagnosis present

## 2017-04-17 DIAGNOSIS — D62 Acute posthemorrhagic anemia: Secondary | ICD-10-CM | POA: Diagnosis present

## 2017-04-17 DIAGNOSIS — H538 Other visual disturbances: Secondary | ICD-10-CM | POA: Diagnosis present

## 2017-04-17 DIAGNOSIS — H35719 Central serous chorioretinopathy, unspecified eye: Secondary | ICD-10-CM | POA: Diagnosis present

## 2017-04-17 DIAGNOSIS — R609 Edema, unspecified: Secondary | ICD-10-CM | POA: Diagnosis not present

## 2017-04-17 HISTORY — DX: Personal history of other medical treatment: Z92.89

## 2017-04-17 HISTORY — DX: Prediabetes: R73.03

## 2017-04-17 HISTORY — DX: Anemia, unspecified: D64.9

## 2017-04-17 HISTORY — DX: Personal history of urinary calculi: Z87.442

## 2017-04-17 LAB — I-STAT CHEM 8, ED
BUN: 16 mg/dL (ref 6–20)
CALCIUM ION: 1.1 mmol/L — AB (ref 1.15–1.40)
Chloride: 100 mmol/L — ABNORMAL LOW (ref 101–111)
Creatinine, Ser: 1.1 mg/dL (ref 0.61–1.24)
GLUCOSE: 117 mg/dL — AB (ref 65–99)
HCT: 15 % — ABNORMAL LOW (ref 39.0–52.0)
HEMOGLOBIN: 5.1 g/dL — AB (ref 13.0–17.0)
Potassium: 3.8 mmol/L (ref 3.5–5.1)
SODIUM: 136 mmol/L (ref 135–145)
TCO2: 25 mmol/L (ref 0–100)

## 2017-04-17 LAB — DIC (DISSEMINATED INTRAVASCULAR COAGULATION) PANEL
D DIMER QUANT: 1 ug{FEU}/mL — AB (ref 0.00–0.50)
FIBRINOGEN: 503 mg/dL — AB (ref 210–475)
SMEAR REVIEW: NONE SEEN

## 2017-04-17 LAB — VITAMIN B12: Vitamin B-12: 212 pg/mL (ref 180–914)

## 2017-04-17 LAB — CBC WITH DIFFERENTIAL/PLATELET
BASOS ABS: 0 10*3/uL (ref 0.0–0.1)
BASOS PCT: 0 %
EOS ABS: 0 10*3/uL (ref 0.0–0.7)
Eosinophils Relative: 0 %
HCT: 14.7 % — ABNORMAL LOW (ref 39.0–52.0)
HEMOGLOBIN: 5.3 g/dL — AB (ref 13.0–17.0)
Lymphocytes Relative: 15 %
Lymphs Abs: 1.1 10*3/uL (ref 0.7–4.0)
MCH: 31.5 pg (ref 26.0–34.0)
MCHC: 36.1 g/dL — ABNORMAL HIGH (ref 30.0–36.0)
MCV: 87.5 fL (ref 78.0–100.0)
Monocytes Absolute: 0.9 10*3/uL (ref 0.1–1.0)
Monocytes Relative: 12 %
NEUTROS PCT: 72 %
Neutro Abs: 5.3 10*3/uL (ref 1.7–7.7)
Platelets: 209 10*3/uL (ref 150–400)
RBC: 1.68 MIL/uL — AB (ref 4.22–5.81)
RDW: 11.5 % (ref 11.5–15.5)
WBC: 7.4 10*3/uL (ref 4.0–10.5)

## 2017-04-17 LAB — URINALYSIS, ROUTINE W REFLEX MICROSCOPIC
Bilirubin Urine: NEGATIVE
Glucose, UA: NEGATIVE mg/dL
KETONES UR: NEGATIVE mg/dL
Leukocytes, UA: NEGATIVE
Nitrite: NEGATIVE
PROTEIN: NEGATIVE mg/dL
SQUAMOUS EPITHELIAL / LPF: NONE SEEN
Specific Gravity, Urine: 1.005 (ref 1.005–1.030)
pH: 6 (ref 5.0–8.0)

## 2017-04-17 LAB — BASIC METABOLIC PANEL
Anion gap: 6 (ref 5–15)
BUN: 17 mg/dL (ref 6–20)
CALCIUM: 8.5 mg/dL — AB (ref 8.9–10.3)
CO2: 24 mmol/L (ref 22–32)
CREATININE: 1.22 mg/dL (ref 0.61–1.24)
Chloride: 100 mmol/L — ABNORMAL LOW (ref 101–111)
Glucose, Bld: 139 mg/dL — ABNORMAL HIGH (ref 65–99)
Potassium: 3.6 mmol/L (ref 3.5–5.1)
SODIUM: 130 mmol/L — AB (ref 135–145)

## 2017-04-17 LAB — RETICULOCYTES
RBC.: 1.95 MIL/uL — AB (ref 4.22–5.81)
Retic Count, Absolute: 31.2 10*3/uL (ref 19.0–186.0)
Retic Ct Pct: 1.6 % (ref 0.4–3.1)

## 2017-04-17 LAB — TSH: TSH: 1.663 u[IU]/mL (ref 0.350–4.500)

## 2017-04-17 LAB — ABO/RH: ABO/RH(D): O POS

## 2017-04-17 LAB — LACTATE DEHYDROGENASE: LDH: 123 U/L (ref 98–192)

## 2017-04-17 LAB — FERRITIN: FERRITIN: 213 ng/mL (ref 24–336)

## 2017-04-17 LAB — POC OCCULT BLOOD, ED: FECAL OCCULT BLD: POSITIVE — AB

## 2017-04-17 LAB — DIC (DISSEMINATED INTRAVASCULAR COAGULATION)PANEL
INR: >10
Platelets: 205 10*3/uL (ref 150–400)
Prothrombin Time: >90 seconds — ABNORMAL HIGH (ref 11.4–15.2)
aPTT: >200 seconds (ref 24–36)

## 2017-04-17 LAB — APTT

## 2017-04-17 LAB — IRON AND TIBC
Iron: 50 ug/dL (ref 45–182)
Saturation Ratios: 25 % (ref 17.9–39.5)
TIBC: 199 ug/dL — ABNORMAL LOW (ref 250–450)
UIBC: 149 ug/dL

## 2017-04-17 LAB — HEPATIC FUNCTION PANEL
ALT: 12 U/L — ABNORMAL LOW (ref 17–63)
AST: 15 U/L (ref 15–41)
Albumin: 3 g/dL — ABNORMAL LOW (ref 3.5–5.0)
Alkaline Phosphatase: 51 U/L (ref 38–126)
BILIRUBIN INDIRECT: 0.5 mg/dL (ref 0.3–0.9)
Bilirubin, Direct: 0.2 mg/dL (ref 0.1–0.5)
TOTAL PROTEIN: 4.9 g/dL — AB (ref 6.5–8.1)
Total Bilirubin: 0.7 mg/dL (ref 0.3–1.2)

## 2017-04-17 LAB — DIRECT ANTIGLOBULIN TEST (NOT AT ARMC)
DAT, COMPLEMENT: NEGATIVE
DAT, IgG: NEGATIVE

## 2017-04-17 LAB — PROTIME-INR
INR: 1.08
Prothrombin Time: >90 seconds — ABNORMAL HIGH (ref 11.4–15.2)
Prothrombin Time: 14 seconds (ref 11.4–15.2)

## 2017-04-17 LAB — SAVE SMEAR

## 2017-04-17 LAB — PREPARE RBC (CROSSMATCH)

## 2017-04-17 LAB — MRSA PCR SCREENING: MRSA BY PCR: NEGATIVE

## 2017-04-17 IMAGING — CT CT RENAL STONE PROTOCOL
2 of 4 series · 15 of 46 positions shown, 17 images · non-contrast
Comparison: [DATE] abdominal sonogram.

CLINICAL DATA: Hematuria. Back and abdominal discomfort. History of
nephrolithiasis.

EXAM:
CT ABDOMEN AND PELVIS WITHOUT CONTRAST
TECHNIQUE: Multidetector CT imaging of the abdomen and pelvis was performed
following the standard protocol without IV contrast.

[Series 3: stone study 5.0 i30f 2 · axial · 0.79mm/px · z∈[+962,+1342]mm · 12 of 84 slices shown, 14 images]
[im 4/84  soft-tissue]
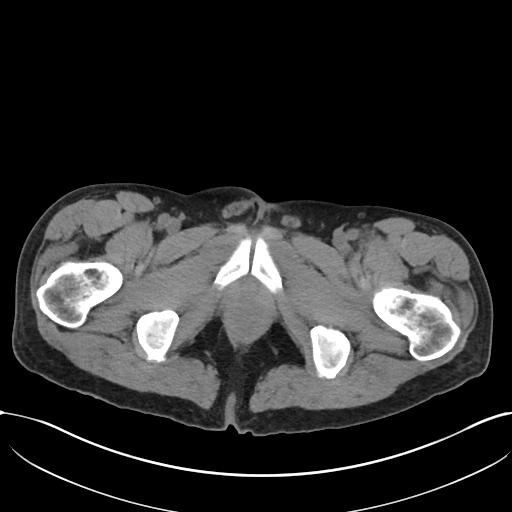
[im 4/84  bone]
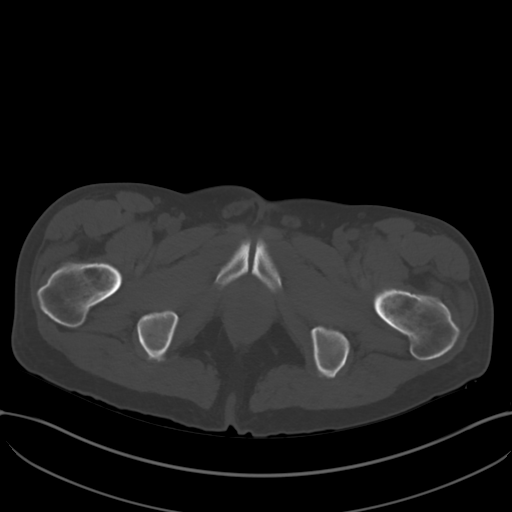
[im 11/84  soft-tissue]
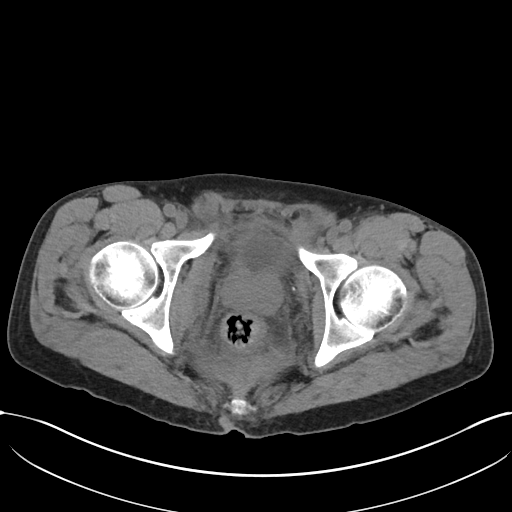
[im 18/84  soft-tissue]
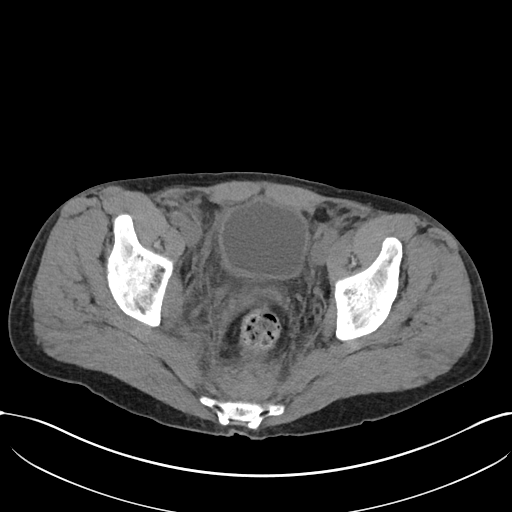
[im 25/84  soft-tissue]
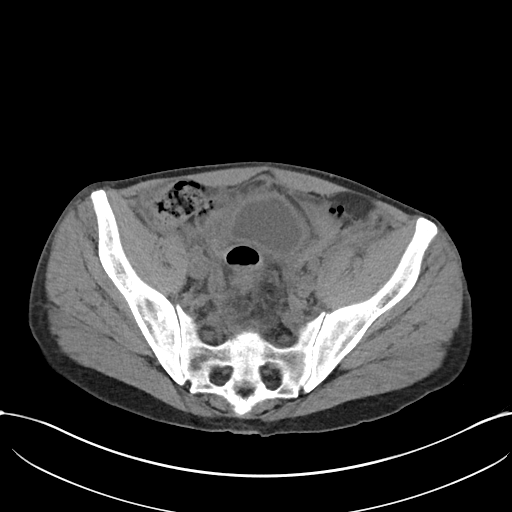
[im 32/84  soft-tissue]
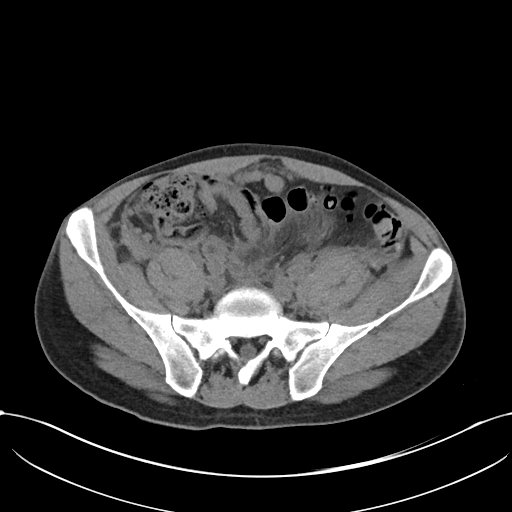
[im 39/84  soft-tissue]
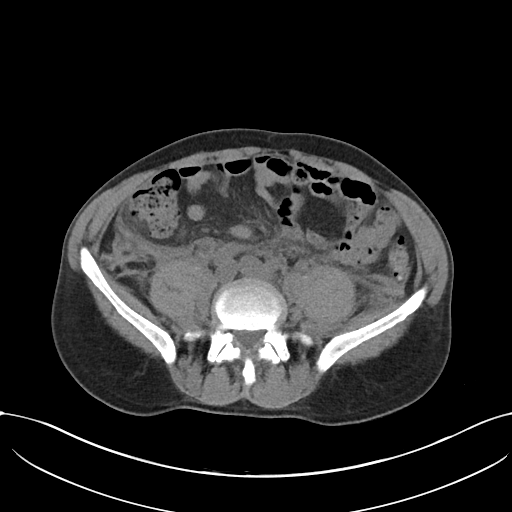
[im 45/84  soft-tissue]
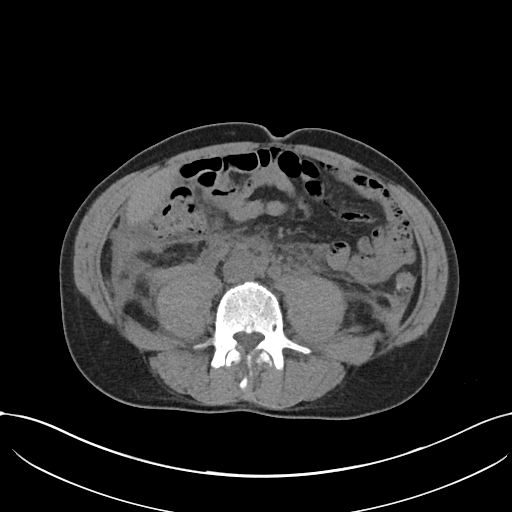
[im 52/84  soft-tissue]
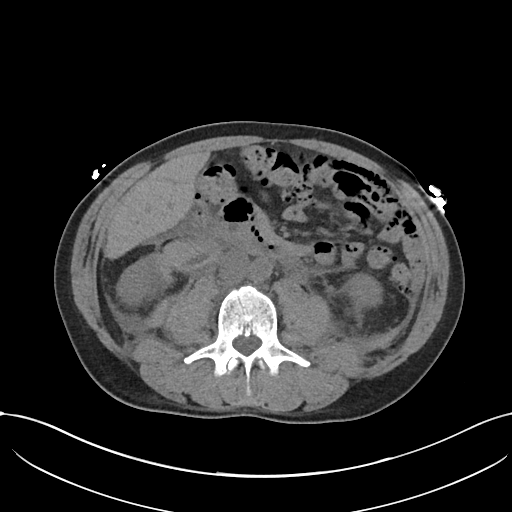
[im 59/84  soft-tissue]
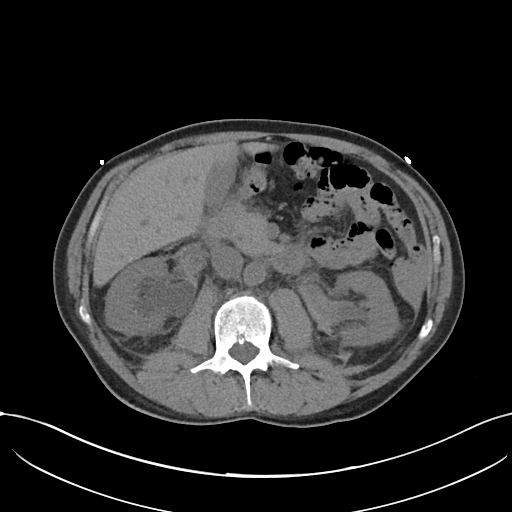
[im 59/84  bone]
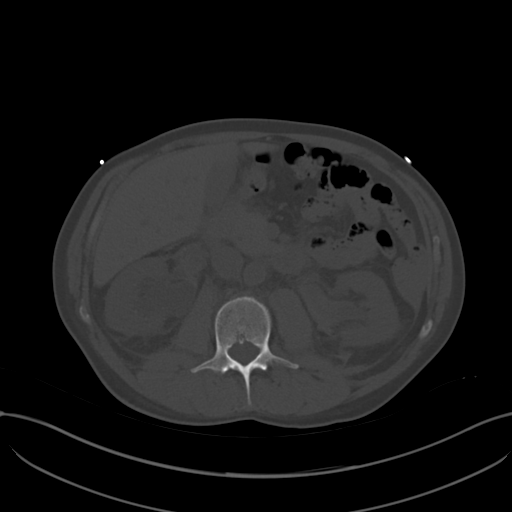
[im 66/84  soft-tissue]
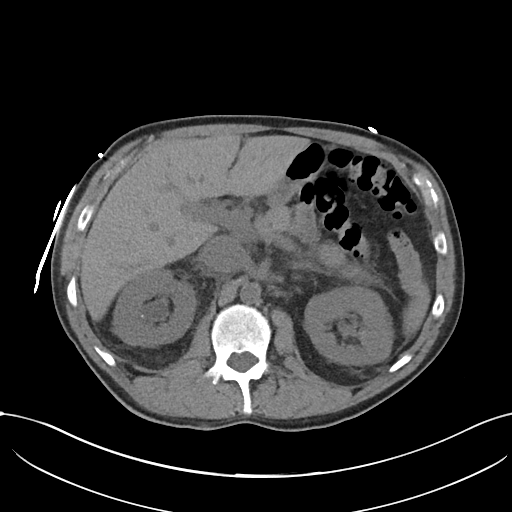
[im 73/84  soft-tissue]
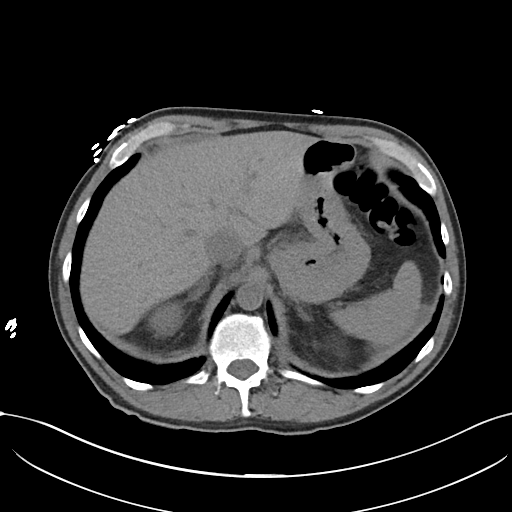
[im 80/84  soft-tissue]
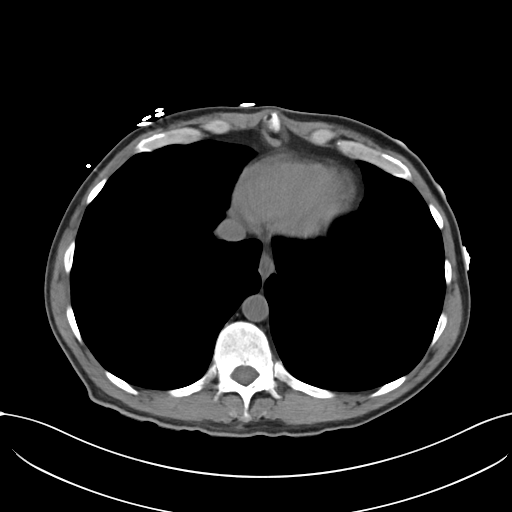

[Series 6: coronal soft tissue · coronal · 0.82mm/px · 3 of 101 slices shown]
[im 34/101  soft-tissue]
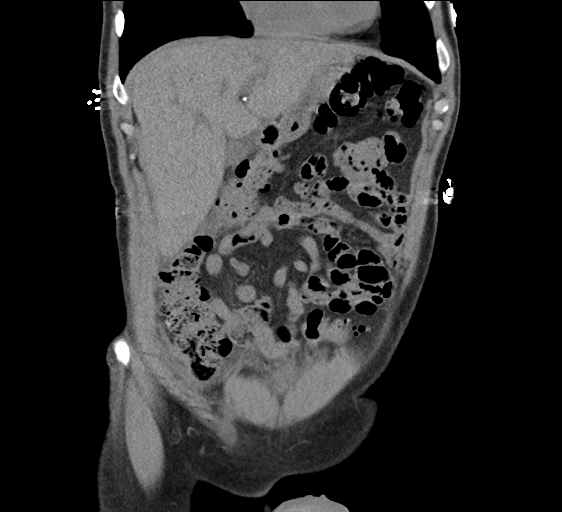
[im 45/101  soft-tissue]
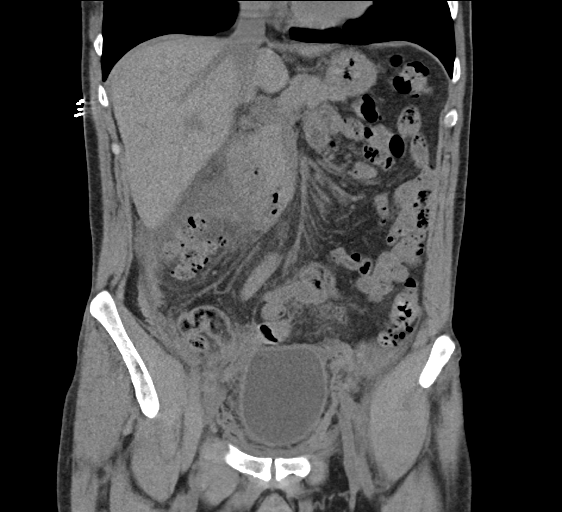
[im 56/101  soft-tissue]
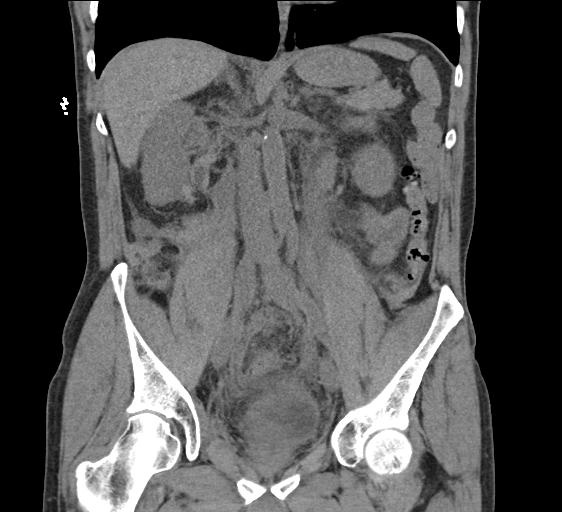

[15 of 46 positions shown; findings below may reference images not displayed]

FINDINGS: Lower chest: No significant pulmonary nodules or acute consolidative
airspace disease.

Hepatobiliary: Normal liver size. Hypodense subcentimeter left lower
lobe lesion, too small to characterize, for which no further
follow-up is required unless the patient has risk factors for liver
malignancy. No additional liver lesions. Normal gallbladder with no
radiopaque cholelithiasis. No biliary ductal dilatation.

Pancreas: Normal, with no mass or duct dilation.

Spleen: Normal size. No mass.

Adrenals/Urinary Tract: Normal adrenals. Moderate right
hydronephrosis. Mild left hydronephrosis. There is prominent
hyperdense urothelial wall thickening throughout the right ureter,
most prominent in the proximal lumbar segment. There is prominent
hyperdense urothelial wall thickening throughout the central left
renal collecting system and lumbar segment of the left ureter.
Punctate nonobstructing 2 mm upper right renal stone. No additional
renal stones. No ureteral stones. No contour deforming renal masses.
No bladder stones or focal bladder abnormalities. Relatively
underdistended bladder with borderline mild diffuse bladder wall
thickening. Extensive heterogeneously hyperdense fluid throughout
the bilateral anterior and posterior paranephric retroperitoneal
spaces, periureteric and perivesical regions and presacral space.

Stomach/Bowel: Grossly normal stomach. Normal caliber small bowel
with no small bowel wall thickening. Normal appendix. Mild
diverticulosis of the descending and sigmoid colon, with no large
bowel wall thickening.

Vascular/Lymphatic: Atherosclerotic nonaneurysmal abdominal aorta.
No pathologically enlarged lymph nodes in the abdomen or pelvis.

Reproductive: Top-normal size prostate with nonspecific internal
prostatic calcifications .

Other: No pneumoperitoneum, ascites or focal fluid collection.

Musculoskeletal: No aggressive appearing focal osseous lesions.
Minimal lumbar spondylosis.
IMPRESSION: 1. Moderate right hydronephrosis. Mild left hydronephrosis.
Extensive irregular hyperdense urothelial wall thickening throughout
the right ureter and throughout the central left renal collecting
system and lumbar segment of the left ureter. Upper tract urothelial
neoplasm cannot be excluded on either side. Urology consultation
advised. Hematuria protocol CT abdomen/pelvis without and with IV
contrast may be useful for further characterization.
2. Solitary punctate nonobstructing upper right renal stone. No
additional renal stones. No ureteral or bladder stones.
3. Extensive hemorrhagic fluid throughout the bilateral
retroperitoneal and extraperitoneal pelvic spaces, with layering
hemorrhage in the presacral space.
4. Mild left colonic diverticulosis, with no evidence of acute
diverticulitis.
5.  Aortic Atherosclerosis ([7Z]-[7Z]).

## 2017-04-17 MED ORDER — SODIUM CHLORIDE 0.9 % IV BOLUS (SEPSIS)
1000.0000 mL | Freq: Once | INTRAVENOUS | Status: AC
  Administered 2017-04-17: 1000 mL via INTRAVENOUS

## 2017-04-17 MED ORDER — VITAMIN K1 10 MG/ML IJ SOLN
10.0000 mg | INTRAVENOUS | Status: AC
  Administered 2017-04-17: 10 mg via INTRAVENOUS
  Filled 2017-04-17: qty 1

## 2017-04-17 MED ORDER — SODIUM CHLORIDE 0.9% FLUSH
3.0000 mL | Freq: Two times a day (BID) | INTRAVENOUS | Status: DC
  Administered 2017-04-17 – 2017-04-21 (×6): 3 mL via INTRAVENOUS

## 2017-04-17 MED ORDER — VITAMIN B-1 100 MG PO TABS
100.0000 mg | ORAL_TABLET | Freq: Every day | ORAL | Status: DC
  Administered 2017-04-17 – 2017-04-21 (×5): 100 mg via ORAL
  Filled 2017-04-17 (×5): qty 1

## 2017-04-17 MED ORDER — FOLIC ACID 1 MG PO TABS
1.0000 mg | ORAL_TABLET | Freq: Every day | ORAL | Status: DC
  Administered 2017-04-17 – 2017-04-21 (×5): 1 mg via ORAL
  Filled 2017-04-17 (×5): qty 1

## 2017-04-17 MED ORDER — ONDANSETRON HCL 4 MG/2ML IJ SOLN: 4.0000 mg | Freq: Four times a day (QID) | INTRAMUSCULAR | Status: DC | PRN

## 2017-04-17 MED ORDER — PANTOPRAZOLE SODIUM 40 MG IV SOLR
40.0000 mg | Freq: Once | INTRAVENOUS | Status: AC
  Administered 2017-04-17: 40 mg via INTRAVENOUS
  Filled 2017-04-17: qty 40

## 2017-04-17 MED ORDER — ONDANSETRON HCL 4 MG PO TABS: 4.0000 mg | ORAL_TABLET | Freq: Four times a day (QID) | ORAL | Status: DC | PRN

## 2017-04-17 MED ORDER — SODIUM CHLORIDE 0.9 % IV SOLN: 10.0000 mL/h | Freq: Once | INTRAVENOUS | Status: DC

## 2017-04-17 MED ORDER — PROTHROMBIN COMPLEX CONC HUMAN 500 UNITS IV KIT
4424.0000 [IU] | PACK | Status: AC
  Administered 2017-04-17: 4424 [IU] via INTRAVENOUS
  Filled 2017-04-17: qty 177

## 2017-04-17 NOTE — H&P (Signed)
History and Physical    Jeffery [name] ION:[id number] DOB: 1961-05-12 DOA: 04/17/2017  Referring MD/NP/PA:  PCP: Denita Lung, MD Outpatient Specialists:  Patient coming from: home  Chief Complaint: sent by PCP after 5 days of hematuria and Hgb 5.3  HPI: Jeffery [name] is a 56 y.o. male with medical history significant of HTN, port-wine stain, alcohol use.  Patient had a burn on his foot from boiling water that healed well.  He then developed N/V starting a few weeks ago accompanied by abdominal pain.  No diarrhea, no fevers.  He then developed back pain and hematuria.  Went to PCP, U/A showed possible UTI and he was given Cipro.  He had 5 days of BRB in urine with clots during the time he feels that he passed a kidney stone.  He had chest pounding and Shortness of breath over the weekend.  Hematuria has resolved and he went to PCP today where he was found to have a Hgb of 5.  Lower extremity swelling and petechiae.  He also has bruising on extremities that are in various stages of healing.   No blood in stools He does drink 5-6 alcoholic beverages per day.  He has been taking ibuprofen for pain as well.  In the ER, he was was found to have an INR > 10, PTT elevated, and Hgb of 5   Review of Systems: all systems reviewed, negative unless stated above in HPI   Past Medical History:  Diagnosis Date  . Central serous retinopathy    hx/o intraocular injection therapy for 3 years; no change after 3 years of therapy, no visual c/o  . Depression   . Former smoker   . Hypertension   . Left varicocele   . Port-wine stain of face   . Renal stones     Past Surgical History:  Procedure Laterality Date  . COLONOSCOPY     never  . polyp on uvula  2006   excision;      reports that he has never smoked. He has never used smokeless tobacco. He reports that he drinks about 18.0 oz of alcohol per week . He reports that he does not use drugs.  Allergies  Allergen Reactions  .  Penicillins Other (See Comments)    Hands & feet peel. "childhood allergy"    Family History  Problem Relation Age of Onset  . Heart disease Mother        pacemaker  . Heart disease Father 32       died of MI, 5s  . Diabetes Father   . Stroke Father   . Migraines Sister   . Mental illness Sister   . Cancer Neg Hx   . Hyperlipidemia Neg Hx   . Hypertension Neg Hx   . Colon cancer Neg Hx      Prior to Admission medications   Medication Sig Start Date End Date Taking? Authorizing Provider  amLODipine (NORVASC) 5 MG tablet TAKE 1 TABLET (5 MG TOTAL) BY MOUTH DAILY. 02/06/17  Yes Denita Lung, MD  ibuprofen (ADVIL,MOTRIN) 200 MG tablet Take 800 mg by mouth every 6 (six) hours as needed.    Yes [provider]  lisinopril-hydrochlorothiazide (PRINZIDE,ZESTORETIC) 20-12.5 MG tablet TAKE 1 TABLET BY MOUTH DAILY. 02/06/17  Yes Denita Lung, MD  Potassium (POTASSIMIN PO) Take 1 tablet by mouth daily as needed (sore legs).   Yes [provider]    Physical Exam: Vitals:   04/17/17 1545  04/17/17 1600 04/17/17 1615 04/17/17 1643  BP: 110/64 124/66 122/60 (!) 102/58  Pulse: 98 (!) 104 (!) 102 (!) 105  Resp: 14 (!) 21 (!) 23 16  Temp:    98.7 F (37.1 C)  TempSrc:    Oral  SpO2: 99% 100% 100% 100%      Constitutional: ill appearing Vitals:   04/17/17 1545 04/17/17 1600 04/17/17 1615 04/17/17 1643  BP: 110/64 124/66 122/60 (!) 102/58  Pulse: 98 (!) 104 (!) 102 (!) 105  Resp: 14 (!) 21 (!) 23 16  Temp:    98.7 F (37.1 C)  TempSrc:    Oral  SpO2: 99% 100% 100% 100%   Eyes: PERRL, lids and conjunctivae normal ENMT: Mucous membranes are moist. Posterior pharynx clear of any exudate or lesions.Normal dentition.  Neck: normal, port-wine stain on neck Respiratory: clear to auscultation bilaterally, no wheezing, no crackles. Normal respiratory effort. No accessory muscle use.  Cardiovascular: Regular rate and rhythm, no murmurs / rubs / gallops.  2+ pedal  pulses. No carotid bruits.  Abdomen: no tenderness, no masses palpated. No hepatosplenomegaly. Bowel sounds positive.  Musculoskeletal: + pitting edema , bruising along lower legs Skin: petechiae on lower legs, bruising on legs/back Neurologic: CN 2-12 grossly intact. Sensation intact, DTR normal. Strength 5/5 in all 4.  Psychiatric: Normal judgment and insight. Alert and oriented x 3. Normal mood.     Labs on Admission: I have personally reviewed following labs and imaging studies  CBC:  Recent Labs Lab 04/13/17 1000 04/17/17 1433 04/17/17 1450  WBC 7.2 7.4  --   NEUTROABS 4,752 5.3  --   HGB 11.7* 5.3*  --   HCT 32.9* 14.7*  --   MCV 89.6 87.5  --   PLT 251 209 355   Basic Metabolic Panel:  Recent Labs Lab 04/13/17 1000 04/17/17 1433  NA 134* 130*  K 3.3* 3.6  CL 100 100*  CO2 28 24  GLUCOSE 105* 139*  BUN 15 17  CREATININE 1.00 1.22  CALCIUM 9.1 8.5*   GFR: Estimated Creatinine Clearance: 80.9 mL/min (by C-G formula based on SCr of 1.22 mg/dL). Liver Function Tests:  Recent Labs Lab 04/13/17 1000 04/17/17 1433  AST 14 15  ALT 14 12*  ALKPHOS 78 51  BILITOT 0.7 0.7  PROT 6.3 4.9*  ALBUMIN 4.2 3.0*   No results for input(s): LIPASE, AMYLASE in the last 168 hours. No results for input(s): AMMONIA in the last 168 hours. Coagulation Profile:  Recent Labs Lab 04/17/17 1450  INR >10.00*   Cardiac Enzymes: No results for input(s): CKTOTAL, CKMB, CKMBINDEX, TROPONINI in the last 168 hours. BNP (last 3 results) No results for input(s): PROBNP in the last 8760 hours. HbA1C: No results for input(s): HGBA1C in the last 72 hours. CBG: No results for input(s): GLUCAP in the last 168 hours. Lipid Profile: No results for input(s): CHOL, HDL, LDLCALC, TRIG, CHOLHDL, LDLDIRECT in the last 72 hours. Thyroid Function Tests: No results for input(s): TSH, T4TOTAL, FREET4, T3FREE, THYROIDAB in the last 72 hours. Anemia Panel: No results for input(s):  VITAMINB12, FOLATE, FERRITIN, TIBC, IRON, RETICCTPCT in the last 72 hours. Urine analysis:    Component Value Date/Time   COLORURINE YELLOW 04/17/2017 1547   APPEARANCEUR HAZY (A) 04/17/2017 1547   LABSPEC 1.005 04/17/2017 1547   LABSPEC 1.020 04/12/2017 0925   PHURINE 6.0 04/17/2017 1547   GLUCOSEU NEGATIVE 04/17/2017 1547   HGBUR LARGE (A) 04/17/2017 1547   BILIRUBINUR NEGATIVE 04/17/2017 1547  BILIRUBINUR negative 04/12/2017 0925   BILIRUBINUR neg 01/14/2013 1003   [place] 04/17/2017 1547   PROTEINUR NEGATIVE 04/17/2017 1547   UROBILINOGEN negative 01/14/2013 1003   NITRITE NEGATIVE 04/17/2017 1547   LEUKOCYTESUR NEGATIVE 04/17/2017 1547   Sepsis Labs: Invalid input(s): PROCALCITONIN, LACTICIDVEN No results found for this or any previous visit (from the past 240 hour(s)).   Radiological Exams on Admission: No results found.    Assessment/Plan Active Problems:   Essential hypertension, benign   Anemia  Anemia- ? ABLA from hematuria vs other etiology -LDH/haptoglobin -B12 -Fe/TIBC/retic count -smear -once labs done, will give 2 units of PRBC and recheck Hgb -heme + stools but no gross blood/dark stools  Elevated PT/INR -STAT repeat to ensure not a lab error or heparin contamination -not on any medications chronically like coumadin ? cipro -will give Kcentra per hematology Dr. Beryle Beams -appreciate consult  HTN -hold home meds  Recent UTI/kidney stone -d/c cipro -culture done in ER -CT scan stone renal study pending  Hyponatremia -suspect related to dehydration vs beer? -IVF and recheck in AM  DVT prophylaxis: none Code Status: full Family Communication: patient Disposition Plan:  Consults called: hematology (Dr. Darnell Level) Admission status: sdu   [facility] Pager [phone]  If 7PM-7AM, please contact night-coverage [email or web address] Password [email or web address]  04/17/2017, 5:13 PM

## 2017-04-17 NOTE — Progress Notes (Signed)
CRITICAL VALUE ALERT  Critical Value:  INR greater than 10 PTT greater than 200  Date & Time Notied:  04-17-17 1922  Provider Notified: Eliseo Squires   Orders Received/Actions taken: continue to monitor

## 2017-04-17 NOTE — ED Provider Notes (Signed)
[facility] DEPT Provider Note   CSN: [id number] Arrival date & time: 04/17/17  1427     History   Chief Complaint No chief complaint on file.   HPI Jeffery [name] is a 56 y.o. male.  HPI   Pt p/w abdominal pain, N/V, hematuria, abnormal bruising/bleeding.  Symptoms began one week ago with upper abdominal pain and back pain, N/V x x days, then developed dark urine and then frank hematuria.  Seen by PCP and started on cipro for UTI.  Pt then developed bilateral lower extremity edema and spontaneous bruising, then  lightheadedness, SOB, pounding in his head and chest, near syncope with walking around.    Only medication changes happened after initial symptoms began - has taken emetrol, ciprofloxacin, and advil this week.    Has never had a blood transfusion.  Is not on blood thinners.  Has been taking approximately 5 tablets of advil daily.  Has never had abdominal surgery.  Has had hx kidney stones remotely, states the back pain he had earlier in the week was somewhat similar.    Past Medical History:  Diagnosis Date  . Anemia   . Central serous retinopathy    hx/o intraocular injection therapy for 3 years; no change after 3 years of therapy, no visual c/o  . Depression   . Former smoker   . History of blood transfusion 04/17/2017   "related to anemia"  . History of kidney stones   . Hypertension   . Left varicocele   . Port-wine stain of face   . Pre-diabetes     Patient Active Problem List   Diagnosis Date Noted  . Coagulopathy ([place]) 04/17/2017  . Anemia 04/17/2017  . Elevated INR 04/17/2017  . Hematuria 04/17/2017  . Petechiae 04/17/2017  . Tendinitis of right triceps 09/14/2016  . Calcific bursitis of shoulder 06/01/2016  . Adhesive capsulitis of left shoulder 06/01/2016  . Essential hypertension, benign 12/16/2011  . Depression 12/16/2011    Past Surgical History:  Procedure Laterality Date  . COLONOSCOPY     never  . EXTRACORPOREAL SHOCK WAVE  LITHOTRIPSY    . PALATE / UVULA BIOPSY / EXCISION  2006   "polyp on uvula cut out"       Home Medications    Prior to Admission medications   Medication Sig Start Date End Date Taking? Authorizing Provider  amLODipine (NORVASC) 5 MG tablet TAKE 1 TABLET (5 MG TOTAL) BY MOUTH DAILY. 02/06/17  Yes Denita Lung, MD  ibuprofen (ADVIL,MOTRIN) 200 MG tablet Take 800 mg by mouth every 6 (six) hours as needed.    Yes [provider]  lisinopril-hydrochlorothiazide (PRINZIDE,ZESTORETIC) 20-12.5 MG tablet TAKE 1 TABLET BY MOUTH DAILY. 02/06/17  Yes Denita Lung, MD  Potassium (POTASSIMIN PO) Take 1 tablet by mouth daily as needed (sore legs).   Yes [provider]    Family History Family History  Problem Relation Age of Onset  . Heart disease Mother        pacemaker  . Heart disease Father 24       died of MI, 55s  . Diabetes Father   . Stroke Father   . Migraines Sister   . Mental illness Sister   . Cancer Neg Hx   . Hyperlipidemia Neg Hx   . Hypertension Neg Hx   . Colon cancer Neg Hx     Social History Social History  Substance Use Topics  . Smoking status: Former Smoker    Packs/day:  1.00    Years: 15.00    Types: Cigarettes    Quit date: 12/01/1997  . Smokeless tobacco: Never Used  . Alcohol use Yes     Comment: 04/17/2017 "stopped 04/08/2017; before that I'd have 2-7 beers/day"     Allergies   Penicillins   Review of Systems Review of Systems  All other systems reviewed and are negative.    Physical Exam Updated Vital Signs BP (!) 110/51 (BP Location: Right Arm)   Pulse (!) 105   Temp 98.1 F (36.7 C) (Oral)   Resp (!) 22   SpO2 100%   Physical Exam  Constitutional: He appears well-developed and well-nourished. No distress.  HENT:  Head: Normocephalic and atraumatic.  Neck: Neck supple.  Cardiovascular: Normal rate and regular rhythm.   Pulmonary/Chest: Effort normal and breath sounds normal. No respiratory distress. He has no  wheezes. He has no rales.  Abdominal: Soft. He exhibits no distension and no mass. There is no tenderness. There is no rebound and no guarding.  Genitourinary: Rectum normal.  Neurological: He is alert. He exhibits normal muscle tone.  Skin: He is not diaphoretic.  Scattered areas of ecchymosis and petchiae mostly on legs, ecchymosis of bilateral ankles, few smaller areas of ecchymosis on midback.    Nursing note and vitals reviewed.            ED Treatments / Results  Labs (all labs ordered are listed, but only abnormal results are displayed) Labs Reviewed  BASIC METABOLIC PANEL - Abnormal; Notable for the following:       Result Value   Sodium 130 (*)    Chloride 100 (*)    Glucose, Bld 139 (*)    Calcium 8.5 (*)    All other components within normal limits  CBC WITH DIFFERENTIAL/PLATELET - Abnormal; Notable for the following:    RBC 1.68 (*)    Hemoglobin 5.3 (*)    HCT 14.7 (*)    MCHC 36.1 (*)    All other components within normal limits  URINALYSIS, ROUTINE W REFLEX MICROSCOPIC - Abnormal; Notable for the following:    APPearance HAZY (*)    Hgb urine dipstick LARGE (*)    Bacteria, UA RARE (*)    All other components within normal limits  DIC (DISSEMINATED INTRAVASCULAR COAGULATION) PANEL - Abnormal; Notable for the following:    Prothrombin Time >90.0 (*)    INR >10.00 (*)    aPTT >200 (*)    Fibrinogen 503 (*)    D-Dimer, Quant 1.00 (*)    All other components within normal limits  HEPATIC FUNCTION PANEL - Abnormal; Notable for the following:    Total Protein 4.9 (*)    Albumin 3.0 (*)    ALT 12 (*)    All other components within normal limits  IRON AND TIBC - Abnormal; Notable for the following:    TIBC 199 (*)    All other components within normal limits  PROTIME-INR - Abnormal; Notable for the following:    Prothrombin Time >90.0 (*)    INR >10.00 (*)    All other components within normal limits  APTT - Abnormal; Notable for the following:     aPTT >200 (*)    All other components within normal limits  POC OCCULT BLOOD, ED - Abnormal; Notable for the following:    Fecal Occult Bld POSITIVE (*)    All other components within normal limits  I-STAT CHEM 8, ED - Abnormal; Notable for the following:  Chloride 100 (*)    Glucose, Bld 117 (*)    Calcium, Ion 1.10 (*)    Hemoglobin 5.1 (*)    HCT 15.0 (*)    All other components within normal limits  URINE CULTURE  CULTURE, BLOOD (ROUTINE X 2)  MRSA PCR SCREENING  SAVE SMEAR  FERRITIN  LACTATE DEHYDROGENASE  VITAMIN B12  PATHOLOGIST SMEAR REVIEW  HAPTOGLOBIN  RAPID URINE DRUG SCREEN, HOSP PERFORMED  PT FACTOR INHIBITOR (MIXING STUDY)  PTT FACTOR INHIBITOR (MIXING STUDY)  RETICULOCYTES  SAVE SMEAR  FACTOR VIII ([facility])  CBC WITH DIFFERENTIAL/PLATELET  PROTIME-INR  APTT  HIV ANTIBODY (ROUTINE TESTING)  CBC WITH DIFFERENTIAL/PLATELET  COMPREHENSIVE METABOLIC PANEL  TSH  PROTIME-INR  TYPE AND SCREEN  PREPARE RBC (CROSSMATCH)  ABO/RH  DIRECT ANTIGLOBULIN TEST (NOT AT [facility])    EKG  EKG Interpretation  Date/Time:  Monday April 17 2017 14:40:22 EDT Ventricular Rate:  104 PR Interval:    QRS Duration: 80 QT Interval:  329 QTC Calculation: 433 R Axis:   63 Text Interpretation:  Sinus tachycardia No STEMI.  Confirmed by Nanda Quinton [phone]) on 04/17/2017 4:35:59 PM       Radiology Ct Renal Stone Study  Result Date: 04/17/2017 CLINICAL DATA:  Hematuria. Back and abdominal discomfort. History of nephrolithiasis. EXAM: CT ABDOMEN AND PELVIS WITHOUT CONTRAST TECHNIQUE: Multidetector CT imaging of the abdomen and pelvis was performed following the standard protocol without IV contrast. COMPARISON:  04/23/2010 abdominal sonogram. FINDINGS: Lower chest: No significant pulmonary nodules or acute consolidative airspace disease. Hepatobiliary: Normal liver size. Hypodense subcentimeter left lower lobe lesion, too small to characterize, for which no further follow-up is required  unless the patient has risk factors for liver malignancy. No additional liver lesions. Normal gallbladder with no radiopaque cholelithiasis. No biliary ductal dilatation. Pancreas: Normal, with no mass or duct dilation. Spleen: Normal size. No mass. Adrenals/Urinary Tract: Normal adrenals. Moderate right hydronephrosis. Mild left hydronephrosis. There is prominent hyperdense urothelial wall thickening throughout the right ureter, most prominent in the proximal lumbar segment. There is prominent hyperdense urothelial wall thickening throughout the central left renal collecting system and lumbar segment of the left ureter. Punctate nonobstructing 2 mm upper right renal stone. No additional renal stones. No ureteral stones. No contour deforming renal masses. No bladder stones or focal bladder abnormalities. Relatively underdistended bladder with borderline mild diffuse bladder wall thickening. Extensive heterogeneously hyperdense fluid throughout the bilateral anterior and posterior paranephric retroperitoneal spaces, periureteric and perivesical regions and presacral space. Stomach/Bowel: Grossly normal stomach. Normal caliber small bowel with no small bowel wall thickening. Normal appendix. Mild diverticulosis of the descending and sigmoid colon, with no large bowel wall thickening. Vascular/Lymphatic: Atherosclerotic nonaneurysmal abdominal aorta. No pathologically enlarged lymph nodes in the abdomen or pelvis. Reproductive: Top-normal size prostate with nonspecific internal prostatic calcifications . Other: No pneumoperitoneum, ascites or focal fluid collection. Musculoskeletal: No aggressive appearing focal osseous lesions. Minimal lumbar spondylosis. IMPRESSION: 1. Moderate right hydronephrosis. Mild left hydronephrosis. Extensive irregular hyperdense urothelial wall thickening throughout the right ureter and throughout the central left renal collecting system and lumbar segment of the left ureter. Upper tract  urothelial neoplasm cannot be excluded on either side. Urology consultation advised. Hematuria protocol CT abdomen/pelvis without and with IV contrast may be useful for further characterization. 2. Solitary punctate nonobstructing upper right renal stone. No additional renal stones. No ureteral or bladder stones. 3. Extensive hemorrhagic fluid throughout the bilateral retroperitoneal and extraperitoneal pelvic spaces, with layering hemorrhage in the presacral space. 4. Mild  left colonic diverticulosis, with no evidence of acute diverticulitis. 5.  Aortic Atherosclerosis (ICD10-I70.0). Electronically Signed   By: Ilona Sorrel M.D.   On: 04/17/2017 20:49    Procedures Procedures (including critical care time)  CRITICAL CARE Performed by: Clayton Bibles   Total critical care time: 30 minutes  Critical care time was exclusive of separately billable procedures and treating other patients.  Critical care was necessary to treat or prevent imminent or life-threatening deterioration.  Critical care was time spent personally by me on the following activities: development of treatment plan with patient and/or surrogate as well as nursing, discussions with consultants, evaluation of patient's response to treatment, examination of patient, obtaining history from patient or surrogate, ordering and performing treatments and interventions, ordering and review of laboratory studies, ordering and review of radiographic studies, pulse oximetry and re-evaluation of patient's condition.   Medications Ordered in ED Medications  0.9 %  sodium chloride infusion (not administered)  sodium chloride flush (NS) 0.9 % injection 3 mL (3 mLs Intravenous Given 04/17/17 2032)  ondansetron (ZOFRAN) tablet 4 mg (not administered)    Or  ondansetron (ZOFRAN) injection 4 mg (not administered)  thiamine (VITAMIN B-1) tablet 100 mg (100 mg Oral Given 03/22/[birth year] 5974)  folic acid (FOLVITE) tablet 1 mg (1 mg Oral Given 04/17/17 2032)    pantoprazole (PROTONIX) injection 40 mg (40 mg Intravenous Given 04/17/17 1510)  sodium chloride 0.9 % bolus 1,000 mL (0 mLs Intravenous Stopped 04/17/17 1551)  prothrombin complex conc human (KCENTRA) IVPB 4,424 Units (0 Units Intravenous Stopped 04/17/17 2052)  phytonadione (VITAMIN K) 10 mg in dextrose 5 % 50 mL IVPB (10 mg Intravenous New Bag/Given 04/17/17 1809)     Initial Impression / Assessment and Plan / ED Course  I have reviewed the triage vital signs and the nursing notes.  Pertinent labs & imaging results that were available during my care of the patient were reviewed by me and considered in my medical decision making (see chart for details).  Clinical Course as of Apr 17 2105  Mon Apr 17, 2017  1558 I spoke with Dr Eliseo Squires, [facility], who accepts patient for admission.  Will come to see the patient in ED now.  Will hold CT.    [EW]    Clinical Course User Index [EW] Isaul Landi, [place]    Afebrile nontoxic patient with abdominal pain N/V, hematuria, started on cipro, then developed lower extremity edema and spontaneous ecchymosis, petechiae.  Pt also seen by Dr Laverta Baltimore, ED attending.  Admitted to [facility], as above.    Final Clinical Impressions(s) / ED Diagnoses   Final diagnoses:  Symptomatic anemia    New Prescriptions Current Discharge Medication List       Clayton Bibles, Hershal Coria 04/17/17 2106    Margette Fast, MD 04/18/17 [phone]

## 2017-04-17 NOTE — ED Notes (Signed)
Attempted report 

## 2017-04-17 NOTE — ED Triage Notes (Signed)
Per EMS pt from PCP for progressive generalized weakness. Pt went last week with normal blood work. Repeat blood work today shows hgb 5.4. No known trauma, denies blood in stool. Endorses blood in urine and easy bruising. Bruises noted to hands, neck and face.  515ml

## 2017-04-17 NOTE — Consult Note (Signed)
Referring MD: Dr. Rudean Curt  PCP:  Denita Lung, MD   Reason for Referral: Hematology consultation to evaluate unexplained coagulopathy     HPI:  56 year old [name] with treated hypertension and history of renal stones.  On Father's Day he accidentally burned the dorsum of his right foot with boiling water.  He was essentially bedridden for the next 2-3 weeks and stayed out of work.  On Saturday 1 week prior to admission July 7, he developed a flulike illness with nausea, epigastric abdominal discomfort, then intractable nausea and vomiting.  He called his primary care Monday morning July 9.  He was instructed to buy Emetrol over the counter to control the nausea and vomiting.  He had chills but did not take his temperature.  He had some mild polymyalgias.  No infectious exposures.  No tick bites. On Tuesday he began to develop bilateral flank pain and gross hematuria.  He saw his primary care on Wednesday.  Cipro was prescribed for possible prostatitis pending further evaluation and urology referral.  He started to take Advil about 6 daily.  Gross hematuria continued until Saturday, July 14 when urine started to clear.  He started to feel profoundly fatigued.  He felt like he was going to pass out every time he stood up.  When he did stand he got a pounding sensation in his chest.  He noticed significant edema of his lower extremities.  He noticed spontaneous bruising.  He saw his primary care physician again this morning.  He could barely drive himself to the office.  He felt like he was going to pass out.  An ambulance was called and he was transported to the emergency department where blood count showed hemoglobin of 5.3, hematocrit 15%, white count 7400 with 72% neutrophils, 15 lymphocytes, 12 monocytes and platelet count 209,000.  CBC just done on July 12 with hemoglobin 11.7.  White count 7200.  Monocytes 18% of that differential. He is on no anticoagulants.  He drinks about a sixpack of beer  daily.  A prothrombin time was greater than 90 seconds.  PTT greater than 200 seconds.  Fibrinogen 503.  D-dimer 1.0.  Urine positive for hemoglobin but only 0-5 red cells.  Liver chemistries with decreased albumin and total protein 3.0 and 4.9 respectively.  Normal transaminases.  Bilirubin 0.5.  LDH 123.  BUN 17.  Creatinine 1.2. He was never told he had a bleeding problem before.  He has never required a blood transfusion up until now.  There is no family history of any bleeding disorder.  He has never had any major surgery.  He had wisdom teeth extracted at age 67 without excessive bleeding. He has noted some intermittent gum bleeding this week.  No epistaxis.  No hematochezia. No known history of liver disease.  Past Medical History:  Diagnosis Date  . Central serous retinopathy    hx/o intraocular injection therapy for 3 years; no change after 3 years of therapy, no visual c/o  . Depression   . Former smoker   . Hypertension   . Left varicocele   . Port-wine stain of face   . Renal stones   : There is no history of MI, ulcers, diabetes, hepatitis, yellow jaundice, malaria, mononucleosis, thyroid disease, seizure, stroke, lupus or inflammatory arthritis.  Past Surgical History:  Procedure Laterality Date  . COLONOSCOPY     never  . polyp on uvula  2006   excision;   :  :  Allergies  Allergen  Reactions  . Penicillins Other (See Comments)    Hands & feet peel. "childhood allergy"  :  Family History: 4 sisters and a brother.  No one with any major health issues.  Problem Relation Age of Onset  . Heart disease Mother        pacemaker  . Heart disease Father 51       died of MI, 43s  . Diabetes Father   . Stroke Father   . Migraines Sister   . Mental illness Sister   . Cancer Neg Hx   . Hyperlipidemia Neg Hx   . Hypertension Neg Hx   . Colon cancer Neg Hx   :  Social History   Social History  . Marital status:  Divorced    Spouse name: N/A  . Number of children:   Son 60.  Daughter Jeffery.  Both healthy  . Years of education: N/A   Occupational History  .  He works in a Engineer, manufacturing.   Social History Main Topics  . Smoking status:  Stopped smoking in 1999.  [name] Smokeless tobacco: Never Used  . Alcohol use  average 6 beers daily    30 Cans of beer per week  . Drug use: No.  Denies use of any cannabinoid products.  . Sexual activity: Not on file   Other Topics Concern  . Not on file   Social History Narrative   Divorced 04/2012, 2 children ages daughter 17yo, son 66yo; exercise - moving on the job, walking, works as Electrical engineer  :  ROS: Eyes: He has had some intermittent blurred vision when he stands up Throat:  Neck: Resp: Dyspnea on exertion Cardio: Strong heart beat, see HPI GI: See HPI Extremities: See HPI Lymph nodes: He noted possible swelling in his left neck Neurologic: No headache Skin: .  Spontaneous bruising see above Genitourinary: See HPI Review of systems otherwise negative except as noted in HPI.  Vitals: Vitals:   04/17/17 1800 04/17/17 1815  BP: (!) 103/52 107/90  Pulse: (!) 107 (!) 119  Resp: 18 14  Temp:  98.3 F (36.8 C)    PHYSICAL EXAM: General appearance: Thin Caucasian [name] HEENT: Large port wine birthmark right face and ear Lymph Nodes: No cervical, supraclavicular, axillary, or inguinal adenopathy Resp: Lungs are clear to auscultation and resonant to percussion Cardio: Regular cardiac rhythm no murmur gallop or rub Vascular: Carotid pulses 2+ no bruits dorsalis pedis pulses 2+ Breasts: GI: Abdomen is soft and nontender without mass or organomegaly GU: Circumcised penis Extremities: 2+ pedal edema. Neurologic: Alert and oriented 3, pupils equal round and reactive to light, optic disks sharp, vessels normal, no hemorrhage or exudate, tongue midline, palate elevates symmetrically.  Motor strength 5/5 all extremities.  Reflexes 2+ symmetric at the biceps and at the patellae. Skin: Multiple  ecchymoses including at sites of recent venipuncture on his arms.  Lateral right thigh, medial to right knee.  Medial to left knee.  Soft tissue ecchymosis involving large part of the skin dorsum left foot extending to the left heel.  Petechial rash from knees down bilaterally.  Labs:   Recent Labs  04/17/17 1433 04/17/17 1450 04/17/17 1741  WBC 7.4  --   --   HGB 5.3*  --  5.1*  HCT 14.7*  --  15.0*  PLT 209 205  --     Recent Labs  04/17/17 1433 04/17/17 1741  NA 130* 136  K 3.6 3.8  CL 100* 100*  CO2 24  --   GLUCOSE 139* 117*  BUN 17 16  CREATININE 1.22 1.10  CALCIUM 8.5*  --     Blood smear review: Normochromic normocytic red cells.  No spherocytes schistocytes polychromasia or inclusions.  Neutrophils are mature.  Subpopulation of immature monocytes.  No blasts.  No myelocytes or metamyelocytes.  Platelets are increased in number and normal in morphology.     Images Studies/Results:  No results found.   Sudden onset of coagulopathy following a recent flulike illness. Although I would consider an atypical immune reaction to Cipro in the differential, his hematuria started prior to receiving the Cipro. There does not appear to be evidence to support DIC.  Fibrinogen is elevated.  Normal platelet count.  No schistocytes on review of the peripheral blood film. Underlying leukemia in the differential (promyelocytic).  Although he has some immature monocytes, they do not appear blastic.  It would be unusual to have a normal platelet count and LDH with an acute leukemic process so I think that this is unlikely at this time. I am most concerned that he has developed an inhibitor to coagulation. This is a life-threatening situation.  Initial treatment is steroids with or without Rituxan. Use of recombinant factor VII a for any active or severe bleeding.  Recommendation: I have requested stat plasma mixing studies to be sent to the coagulation laboratory at [facility]. Since it will take a number of hours to get these results, I am going to go ahead and give him 10 mg of parenteral vitamin K to maximize clotting factor synthesis and a 25 U/kg dose of for factor clotting concentrate. If in inhibitor to coagulation is determined, then begin parenteral Solu-Medrol 1 mg/kg daily pending further characterization and titration of the inhibitor. Potential for allergic reactions to parenteral vitamin K and/or clotting factor concentrate reviewed with the patient.  Benefit of using these products currently outweighs small risk.  He agrees to proceed.   Murriel Hopper, MD, [place]  Hematology-Oncology/Internal Medicine [phone] 04/17/2017, 6:24 PM

## 2017-04-17 NOTE — ED Notes (Signed)
Called main lab to add Pathologist smear, to blood in main lab.

## 2017-04-17 NOTE — Progress Notes (Signed)
   Subjective:    Patient ID: Jeffery [name], male    DOB: 05-22-1961, 56 y.o.   MRN: [id number]  HPI He is here for recheck. He was seen on July 12 for evaluation of easy bruisability, hematuria, petechiae and bleeding from the gums. The initial blood work was negative. He returns today noting difficulty since Saturday with increasing shortness of breath, rapid heart rate, weakness. No black tarry stools no vomiting any blood. He has also noted increased swelling especially in the right leg as well as more ecchymotic changes, left hand and right elbow.   Review of Systems     Objective:   Physical Exam Alert and quite pale looking. Hemoglobin in the office 5.2. Ecchymotic changes noted on the left hand, right forearm. Petechiae still noted on his legs.       Assessment & Plan:  Anemia, unspecified type He will be sent to the hospital for further evaluation.

## 2017-04-17 NOTE — ED Notes (Signed)
I stat chem 8 was completed but would not cross over into Epic due to "no data" in the Hb field.  Repeated test with same blood sample but still no data.  A new sample was drawn and Chem 8 completed again.  Hb will not result, therefore none of the values will cross over into Epic.  Clyde Lundborg RN notified.

## 2017-04-17 NOTE — ED Notes (Signed)
Hematologist at bedside 

## 2017-04-18 ENCOUNTER — Inpatient Hospital Stay (HOSPITAL_COMMUNITY): Payer: BLUE CROSS/BLUE SHIELD

## 2017-04-18 DIAGNOSIS — R609 Edema, unspecified: Secondary | ICD-10-CM

## 2017-04-18 DIAGNOSIS — N1330 Unspecified hydronephrosis: Secondary | ICD-10-CM

## 2017-04-18 LAB — COMPREHENSIVE METABOLIC PANEL
ALT: 11 U/L — ABNORMAL LOW (ref 17–63)
AST: 13 U/L — ABNORMAL LOW (ref 15–41)
Albumin: 2.8 g/dL — ABNORMAL LOW (ref 3.5–5.0)
Alkaline Phosphatase: 51 U/L (ref 38–126)
Anion gap: 7 (ref 5–15)
BUN: 13 mg/dL (ref 6–20)
CHLORIDE: 105 mmol/L (ref 101–111)
CO2: 24 mmol/L (ref 22–32)
CREATININE: 1.07 mg/dL (ref 0.61–1.24)
Calcium: 8.5 mg/dL — ABNORMAL LOW (ref 8.9–10.3)
Glucose, Bld: 113 mg/dL — ABNORMAL HIGH (ref 65–99)
POTASSIUM: 3.8 mmol/L (ref 3.5–5.1)
SODIUM: 136 mmol/L (ref 135–145)
Total Bilirubin: 1.1 mg/dL (ref 0.3–1.2)
Total Protein: 4.7 g/dL — ABNORMAL LOW (ref 6.5–8.1)

## 2017-04-18 LAB — CBC WITH DIFFERENTIAL/PLATELET
BASOS ABS: 0 10*3/uL (ref 0.0–0.1)
BASOS PCT: 0 %
EOS PCT: 2 %
Eosinophils Absolute: 0.1 10*3/uL (ref 0.0–0.7)
HEMATOCRIT: 19.2 % — AB (ref 39.0–52.0)
Hemoglobin: 6.7 g/dL — CL (ref 13.0–17.0)
LYMPHS PCT: 19 %
Lymphs Abs: 1.2 10*3/uL (ref 0.7–4.0)
MCH: 30.5 pg (ref 26.0–34.0)
MCHC: 34.9 g/dL (ref 30.0–36.0)
MCV: 87.3 fL (ref 78.0–100.0)
Monocytes Absolute: 1.1 10*3/uL — ABNORMAL HIGH (ref 0.1–1.0)
Monocytes Relative: 17 %
NEUTROS ABS: 4.2 10*3/uL (ref 1.7–7.7)
Neutrophils Relative %: 63 %
PLATELETS: 192 10*3/uL (ref 150–400)
RBC: 2.2 MIL/uL — AB (ref 4.22–5.81)
RDW: 13.5 % (ref 11.5–15.5)
WBC: 6.7 10*3/uL (ref 4.0–10.5)

## 2017-04-18 LAB — RAPID URINE DRUG SCREEN, HOSP PERFORMED
AMPHETAMINES: NOT DETECTED
BENZODIAZEPINES: NOT DETECTED
Barbiturates: NOT DETECTED
COCAINE: NOT DETECTED
Opiates: NOT DETECTED
Tetrahydrocannabinol: NOT DETECTED

## 2017-04-18 LAB — CBC
HCT: 18.4 % — ABNORMAL LOW (ref 39.0–52.0)
HEMOGLOBIN: 6.4 g/dL — AB (ref 13.0–17.0)
MCH: 29.9 pg (ref 26.0–34.0)
MCHC: 34.8 g/dL (ref 30.0–36.0)
MCV: 86 fL (ref 78.0–100.0)
PLATELETS: 236 10*3/uL (ref 150–400)
RBC: 2.14 MIL/uL — AB (ref 4.22–5.81)
RDW: 13.5 % (ref 11.5–15.5)
WBC: 6.2 10*3/uL (ref 4.0–10.5)

## 2017-04-18 LAB — APTT: APTT: 38 s — AB (ref 24–36)

## 2017-04-18 LAB — URINE CULTURE: CULTURE: NO GROWTH

## 2017-04-18 LAB — PROTIME-INR
INR: 1.34
INR: 1.73
Prothrombin Time: 16.7 seconds — ABNORMAL HIGH (ref 11.4–15.2)
Prothrombin Time: 20.4 seconds — ABNORMAL HIGH (ref 11.4–15.2)

## 2017-04-18 NOTE — Progress Notes (Signed)
*  PRELIMINARY RESULTS* Vascular Ultrasound Lower extremity venous duplex has been completed.  Preliminary findings: No evidence of DVT or baker's cyst.   Landry Mellow, RDMS, RVT  04/18/2017, 2:27 PM

## 2017-04-18 NOTE — Progress Notes (Signed)
Hematology: Repeat coag tests yesterday reproducibly abnormal and correlate clinically with extensive oozing and gross hematuria. Noncontrast CT scan done last night showed retroperitoneal hemorrhage, bilateral hydronephrosis right greater than left, ureteral wall thickening throughout the right ureter and left central renal collecting system.  Solitary nonobstructing upper right renal stone.  I have not had a chance to review the scans myself.  Clinically it is more likely that the abnormalities in the right ureter and collecting system related to clotted blood and not neoplasm. There has been a dramatic improvement in his coagulation profile coincident with administration of parenteral vitamin K and for factor clotting concentrate.  Pro time down to 16.7 seconds, PTT 38 seconds.  Platelets remain normal at 192,000.  Hemoglobin 6.7 following 2 unit blood transfusion. He is hemodynamically stable.  Urine is clear. Impression: Acute idiopathic coagulopathy Related to viral syndrome?  Medication atypical reaction to [facility]? At this point it appears that an inhibitor to coagulation is less likely given prompt response to clotting factor replacement.  Plasma mixing studies and factor VIII level sent to [facility] still pending. Recommendation: Coagulopathy now almost completely resolved.  No signs of active bleeding.  He is hemodynamically stable.  I do not think we need to give him any additional blood.  I would start him on an iron supplement. Discussed with hospitalist who is at the bedside at this time. I would favor no GU instrumentation at this time.  Repeat a noncontrast CT scan in a few weeks.

## 2017-04-18 NOTE — Progress Notes (Signed)
Chaplain presented to the patient, and introduced self. Provided the ministry of presence as the patient shared his journey of healing and wholeness. He is very emotional, and appreciative of how God has blessed his life.  Chaplain offered a prayer of continued healing and wholeness. Chaplain will follow up at a later time. Chaplain Yaakov Guthrie [phone]

## 2017-04-18 NOTE — Progress Notes (Signed)
Admission note:  Arrival Method: Patient came in w/c from [facility]. Mental Orientation: Alert and oriented x 4. Telemetry: N/A Assessment: See doc flow sheets. Skin: Warm, dry and intact with petechia noted, see the assessment note. IV: L AC and R AC saline lock. Pain: Denies any pain currently. Tubes: N/A Safety Measures: Bed in low position, call bell and phone within reach. Fall Prevention Safety Plan: Reviewed the plan, understood and acknowledged. Admission Screening: Completed. 6700 Orientation: Patient has been oriented to the unit, staff and to the room.
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
## 2017-04-19 DIAGNOSIS — D689 Coagulation defect, unspecified: Principal | ICD-10-CM

## 2017-04-19 LAB — CBC WITH DIFFERENTIAL/PLATELET
BASOS ABS: 0 10*3/uL (ref 0.0–0.1)
BASOS PCT: 0 %
EOS ABS: 0.3 10*3/uL (ref 0.0–0.7)
Eosinophils Relative: 5 %
HCT: 16.9 % — ABNORMAL LOW (ref 39.0–52.0)
HEMOGLOBIN: 5.9 g/dL — AB (ref 13.0–17.0)
Lymphocytes Relative: 21 %
Lymphs Abs: 1.4 10*3/uL (ref 0.7–4.0)
MCH: 30.7 pg (ref 26.0–34.0)
MCHC: 34.9 g/dL (ref 30.0–36.0)
MCV: 88 fL (ref 78.0–100.0)
Monocytes Absolute: 0.7 10*3/uL (ref 0.1–1.0)
Monocytes Relative: 10 %
NEUTROS ABS: 4.4 10*3/uL (ref 1.7–7.7)
NEUTROS PCT: 64 %
Platelets: 231 10*3/uL (ref 150–400)
RBC: 1.92 MIL/uL — AB (ref 4.22–5.81)
RDW: 13.8 % (ref 11.5–15.5)
WBC: 6.9 10*3/uL (ref 4.0–10.5)

## 2017-04-19 LAB — HAPTOGLOBIN: HAPTOGLOBIN: 217 mg/dL — AB (ref 34–200)

## 2017-04-19 LAB — PROTIME-INR
INR: 2.78
INR: 2.72
PROTHROMBIN TIME: 29.9 s — AB (ref 11.4–15.2)
Prothrombin Time: 29.4 seconds — ABNORMAL HIGH (ref 11.4–15.2)

## 2017-04-19 LAB — BASIC METABOLIC PANEL
ANION GAP: 8 (ref 5–15)
BUN: 12 mg/dL (ref 6–20)
CHLORIDE: 103 mmol/L (ref 101–111)
CO2: 24 mmol/L (ref 22–32)
CREATININE: 0.92 mg/dL (ref 0.61–1.24)
Calcium: 8.7 mg/dL — ABNORMAL LOW (ref 8.9–10.3)
GFR calc non Af Amer: >60 mL/min (ref >60)
Glucose, Bld: 99 mg/dL (ref 65–99)
POTASSIUM: 3.6 mmol/L (ref 3.5–5.1)
SODIUM: 135 mmol/L (ref 135–145)

## 2017-04-19 LAB — HIV ANTIBODY (ROUTINE TESTING W REFLEX): HIV Screen 4th Generation wRfx: NONREACTIVE

## 2017-04-19 LAB — APTT
APTT: 52 s — AB (ref 24–36)
APTT: 50 s — AB (ref 24–36)

## 2017-04-19 LAB — PATHOLOGIST SMEAR REVIEW

## 2017-04-19 LAB — PREPARE RBC (CROSSMATCH)

## 2017-04-19 MED ORDER — PHYTONADIONE 5 MG PO TABS
5.0000 mg | ORAL_TABLET | Freq: Every day | ORAL | Status: AC
  Administered 2017-04-19 – 2017-04-20 (×2): 5 mg via ORAL
  Filled 2017-04-19 (×2): qty 1

## 2017-04-19 MED ORDER — SODIUM CHLORIDE 0.9 % IV SOLN: Freq: Once | INTRAVENOUS | Status: DC

## 2017-04-19 MED ORDER — ACETAMINOPHEN 325 MG PO TABS
650.0000 mg | ORAL_TABLET | Freq: Four times a day (QID) | ORAL | Status: DC | PRN
  Administered 2017-04-19 – 2017-04-21 (×6): 650 mg via ORAL
  Filled 2017-04-19 (×4): qty 2

## 2017-04-19 MED ORDER — PROTHROMBIN COMPLEX CONC HUMAN 500 UNITS IV KIT
2132.0000 [IU] | PACK | Freq: Once | Status: AC
  Administered 2017-04-19: 2132 [IU] via INTRAVENOUS
  Filled 2017-04-19: qty 45.28

## 2017-04-19 NOTE — Progress Notes (Signed)
Hematology: Laboratory tests sent to [facility] were likely done after he received the concentrated clotting factors and vitamin K.  Pro time 10.4 seconds.  PTT 28.6 seconds.  Factor VIII activity 310% of normal.  This is indirect evidence that he does not have an inhibitor to to coagulation but a factor deficiency.  This still does not make sense clinically.  He is clinically behaving like he does have an inhibitor to coagulation. Hemoglobin has drifted down to 5.9.  Platelet count remains normal.  Prothrombin time is now increasing again back up to 29.9 seconds with INR 2.8.  PTT pending. On exam: Hemodynamically stable It is now clear that some of the swelling he experienced in his lower extremities was due to extensive soft tissue bleeding.  There are now resolving signs of ecchymoses in the soft tissues of his legs bilaterally. He has a mild headache.  Pupils equal round reactive to light.  No focal neurologic deficits.  We need to watch him closely for any neuro changes.  Lungs are clear.  Regular cardiac rhythm.  Abdomen is soft and nontender.  Impression: Idiopathic severe coagulopathy Triggering factors are unclear.  Initial "viral syndrome" with abdominal discomfort and vomiting may have been early GI bleeding given CT findings of retroperitoneal and intravesical hemorrhage. Recommendation: I am going to repeat the mixing studies and also get specific factor assays for factors 7 and 10. I have ordered a 2 unit blood transfusion and some oral vitamin K I will repeat coag studies again this afternoon and if they remain abnormal then give an additional dose of K Centra clotting concentrate.  Murriel Hopper, MD, [place]  Hematology-Oncology/Internal Medicine [phone]
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
## 2017-04-20 LAB — TYPE AND SCREEN
ABO/RH(D): O POS
Antibody Screen: NEGATIVE
Unit division: 0
Unit division: 0
Unit division: 0
Unit division: 0

## 2017-04-20 LAB — CBC WITH DIFFERENTIAL/PLATELET
Basophils Absolute: 0 10*3/uL (ref 0.0–0.1)
Basophils Relative: 0 %
EOS ABS: 0.3 10*3/uL (ref 0.0–0.7)
Eosinophils Relative: 4 %
HEMATOCRIT: 23 % — AB (ref 39.0–52.0)
HEMOGLOBIN: 7.9 g/dL — AB (ref 13.0–17.0)
LYMPHS ABS: 1.3 10*3/uL (ref 0.7–4.0)
LYMPHS PCT: 18 %
MCH: 30.4 pg (ref 26.0–34.0)
MCHC: 34.3 g/dL (ref 30.0–36.0)
MCV: 88.5 fL (ref 78.0–100.0)
MONOS PCT: 14 %
Monocytes Absolute: 1 10*3/uL (ref 0.1–1.0)
NEUTROS PCT: 63 %
Neutro Abs: 4.6 10*3/uL (ref 1.7–7.7)
Platelets: 306 10*3/uL (ref 150–400)
RBC: 2.6 MIL/uL — ABNORMAL LOW (ref 4.22–5.81)
RDW: 13.6 % (ref 11.5–15.5)
WBC: 7.3 10*3/uL (ref 4.0–10.5)

## 2017-04-20 LAB — BPAM RBC
BLOOD PRODUCT EXPIRATION DATE: 201808092359
Blood Product Expiration Date: 201808082359
Blood Product Expiration Date: 201808082359
Blood Product Expiration Date: 201808092359
ISSUE DATE / TIME: 201807162232
ISSUE DATE / TIME: 201807181055
ISSUE DATE / TIME: 201807161618
ISSUE DATE / TIME: 201807181451
UNIT TYPE AND RH: 5100
Unit Type and Rh: 5100
Unit Type and Rh: 5100
Unit Type and Rh: 5100

## 2017-04-20 LAB — COMPREHENSIVE METABOLIC PANEL
ALBUMIN: 3.1 g/dL — AB (ref 3.5–5.0)
ALK PHOS: 54 U/L (ref 38–126)
ALT: 11 U/L — AB (ref 17–63)
AST: 13 U/L — AB (ref 15–41)
Anion gap: 5 (ref 5–15)
BILIRUBIN TOTAL: 1.7 mg/dL — AB (ref 0.3–1.2)
BUN: 11 mg/dL (ref 6–20)
CALCIUM: 9.1 mg/dL (ref 8.9–10.3)
CO2: 28 mmol/L (ref 22–32)
CREATININE: 0.91 mg/dL (ref 0.61–1.24)
Chloride: 105 mmol/L (ref 101–111)
GFR calc Af Amer: >60 mL/min (ref >60)
GFR calc non Af Amer: >60 mL/min (ref >60)
GLUCOSE: 103 mg/dL — AB (ref 65–99)
Potassium: 3.4 mmol/L — ABNORMAL LOW (ref 3.5–5.1)
Sodium: 138 mmol/L (ref 135–145)
TOTAL PROTEIN: 5.5 g/dL — AB (ref 6.5–8.1)

## 2017-04-20 LAB — APTT: aPTT: 42 seconds — ABNORMAL HIGH (ref 24–36)

## 2017-04-20 LAB — PROTIME-INR
INR: 1.99
PROTHROMBIN TIME: 22.9 s — AB (ref 11.4–15.2)

## 2017-04-20 MED ORDER — FERROUS SULFATE 325 (65 FE) MG PO TABS
325.0000 mg | ORAL_TABLET | Freq: Two times a day (BID) | ORAL | Status: DC
  Administered 2017-04-20 – 2017-04-21 (×3): 325 mg via ORAL
  Filled 2017-04-20 (×3): qty 1

## 2017-04-20 MED ORDER — POLYETHYLENE GLYCOL 3350 17 G PO PACK
17.0000 g | PACK | Freq: Every day | ORAL | Status: DC
  Filled 2017-04-20: qty 1

## 2017-04-20 MED ORDER — B COMPLEX-C PO TABS
1.0000 | ORAL_TABLET | Freq: Every day | ORAL | Status: DC
  Administered 2017-04-20 – 2017-04-21 (×2): 1 via ORAL
  Filled 2017-04-20 (×2): qty 1

## 2017-04-20 MED ORDER — PHYTONADIONE 5 MG PO TABS
5.0000 mg | ORAL_TABLET | Freq: Every day | ORAL | Status: AC
Start: 2017-04-21 — End: 2017-04-21
  Administered 2017-04-21: 5 mg via ORAL
  Filled 2017-04-20: qty 1

## 2017-04-20 MED ORDER — POTASSIUM CHLORIDE CRYS ER 20 MEQ PO TBCR
40.0000 meq | EXTENDED_RELEASE_TABLET | Freq: Once | ORAL | Status: AC
  Administered 2017-04-20: 40 meq via ORAL
  Filled 2017-04-20: qty 2
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
## 2017-04-20 NOTE — Progress Notes (Signed)
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 Factor VII 2% Factor VIII 210%               X  51%               II  48% Second dose of Kcentra given 7/17 PT 22.9 PTT 42 seconds this AM Hb 7.9 after 2 additional units of blood Exam: stable. No headache. No focal neuro deficit.Diffuse ecchymoses. Abd soft non tender Impression: Idiopathic coagulopathy Pattern of test results still does not fit logical pattern. No obvious reason to have acquired factor deficiency: not on prolonged course of antibiotics; not on an anticoagulant; denies drug use; ho hx of gastric bypass surg/malabsorption; no abnormal diet Rec: Continue oral vitamin K daily Needs at least another 24 hours in hospital to assess clotting & bleeding status when K-centra clotting factor is out of his system  OK to increase activity    Murriel Hopper, MD, [place]  Hematology-Oncology/Internal Medicine [phone]

## 2017-04-21 ENCOUNTER — Telehealth: Payer: Self-pay | Admitting: *Deleted

## 2017-04-21 ENCOUNTER — Other Ambulatory Visit: Payer: Self-pay | Admitting: Oncology

## 2017-04-21 DIAGNOSIS — D689 Coagulation defect, unspecified: Secondary | ICD-10-CM

## 2017-04-21 LAB — APTT: aPTT: 55 seconds — ABNORMAL HIGH (ref 24–36)

## 2017-04-21 LAB — MISCELLANEOUS TEST

## 2017-04-21 LAB — CBC WITH DIFFERENTIAL/PLATELET
BASOS PCT: 0 %
Basophils Absolute: 0 10*3/uL (ref 0.0–0.1)
EOS ABS: 0.3 10*3/uL (ref 0.0–0.7)
EOS PCT: 3 %
HCT: 25.3 % — ABNORMAL LOW (ref 39.0–52.0)
Hemoglobin: 8.5 g/dL — ABNORMAL LOW (ref 13.0–17.0)
Lymphocytes Relative: 15 %
Lymphs Abs: 1.3 10*3/uL (ref 0.7–4.0)
MCH: 30.2 pg (ref 26.0–34.0)
MCHC: 33.6 g/dL (ref 30.0–36.0)
MCV: 90 fL (ref 78.0–100.0)
MONO ABS: 1.3 10*3/uL — AB (ref 0.1–1.0)
MONOS PCT: 15 %
Neutro Abs: 5.9 10*3/uL (ref 1.7–7.7)
Neutrophils Relative %: 67 %
Platelets: 353 10*3/uL (ref 150–400)
RBC: 2.81 MIL/uL — ABNORMAL LOW (ref 4.22–5.81)
RDW: 14.3 % (ref 11.5–15.5)
WBC: 8.8 10*3/uL (ref 4.0–10.5)

## 2017-04-21 LAB — PROTIME-INR
INR: 2.15
PROTHROMBIN TIME: 24.3 s — AB (ref 11.4–15.2)

## 2017-04-21 LAB — FACTOR 5 ASSAY: FACTOR V ACTIVITY: 119 % (ref 70–150)

## 2017-04-21 LAB — FACTOR 2 ASSAY: Factor II Activity: 44 % — ABNORMAL LOW (ref 50–154)

## 2017-04-21 MED ORDER — FERROUS SULFATE 325 (65 FE) MG PO TABS: 325.0000 mg | ORAL_TABLET | Freq: Two times a day (BID) | ORAL | 0 refills | Status: DC

## 2017-04-21 MED ORDER — POLYETHYLENE GLYCOL 3350 17 G PO PACK: 17.0000 g | PACK | Freq: Every day | ORAL | 0 refills | Status: DC | PRN

## 2017-04-21 MED ORDER — B COMPLEX-C PO TABS: 1.0000 | ORAL_TABLET | Freq: Every day | ORAL | 0 refills | Status: DC

## 2017-04-21 MED ORDER — PHYTONADIONE 5 MG PO TABS
10.0000 mg | ORAL_TABLET | Freq: Every day | ORAL | Status: DC
  Filled 2017-04-21: qty 2

## 2017-04-21 MED ORDER — THIAMINE HCL 100 MG PO TABS: 100.0000 mg | ORAL_TABLET | Freq: Every day | ORAL | 0 refills | Status: DC

## 2017-04-21 MED ORDER — PHYTONADIONE 5 MG PO TABS: 10.0000 mg | ORAL_TABLET | Freq: Every day | ORAL | 0 refills | Status: DC

## 2017-04-21 MED ORDER — FOLIC ACID 1 MG PO TABS: 1.0000 mg | ORAL_TABLET | Freq: Every day | ORAL | 0 refills | Status: DC

## 2017-04-21 NOTE — Progress Notes (Signed)
Hematology: Hemoglobin has stabilized at 8.5 today. PT and PTT both slowly rising again and now 24-hour since last dose of [place]. PT 24.3 seconds, PTT 55 seconds. Exam stable. No new areas of ecchymoses. He was able to ambulate without difficulty. No focal neuro deficits. Impression: Idiopathic coagulopathy Patient now admits in confidence that he did use over the counter cannabinoid preparations in the few days immediately preceding the onset of the current illness. I had specifically questioned him about this on admission. He did not say anything until now since he is really concerned about keeping his job. Everything now makes perfect sense with his extremely low factor VII level with other clotting factors midrange or high normal. 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 This has recently been described. This chemical is long acting for many weeks, sometimes over a month. It has required large doses of vitamin K up to 100 mg daily to support coagulation factor production until the substance is out of the bloodstream. Recommendation: Now that we have a reasonable explanation for his coagulopathy, I think he could be discharged with close follow-up on 10 mg of vitamin K daily. I will see him in my hematology clinic on Monday for reevaluation. I Appreciate assistance from hospital medicine service.

## 2017-04-21 NOTE — Discharge Instructions (Signed)
Vitamin K Foods  High vitamin K foods Foods that are high in vitamin K contain more than 100 mcg (micrograms) per serving. These include:  Broccoli (cooked) -  cup has 110 mcg.  Brussels sprouts (cooked) -  cup has 109 mcg.  Greens, beet (cooked) -  cup has 350 mcg.  Greens, collard (cooked) -  cup has 418 mcg.  Greens, turnip (cooked) -  cup has 265 mcg.  Green onions or scallions -  cup has 105 mcg.  Kale (fresh or frozen) -  cup has 531 mcg.  Parsley (raw) - 10 sprigs has 164 mcg.  Spinach (cooked) -  cup has 444 mcg.  Swiss chard (cooked) -  cup has 287 mcg.  Moderate vitamin K foods Foods that have a moderate amount of vitamin K contain 25-100 mcg per serving. These include:  Asparagus (cooked) - 5 spears have 38 mcg.  Black-eyed peas (dried) -  cup has 32 mcg.  Cabbage (cooked) -  cup has 37 mcg.  Kiwi fruit - 1 medium has 31 mcg.  Lettuce - 1 cup has 57-63 mcg.  Okra (frozen) -  cup has 44 mcg.  Prunes (dried) - 5 prunes have 25 mcg.  Watercress (raw) - 1 cup has 85 mcg.  Low vitamin K foods Foods low in vitamin K contain less than 25 mcg per serving. These include:  Artichoke - 1 medium has 18 mcg.  Avocado - 1 oz. has 6 mcg.  Blueberries -  cup has 14 mcg.  Cabbage (raw) -  cup has 21 mcg.  Carrots (cooked) -  cup has 11 mcg.  Cauliflower (raw) -  cup has 11 mcg.  Cucumber with peel (raw) -  cup has 9 mcg.  Grapes -  cup has 12 mcg.  Mango - 1 medium has 9 mcg.  Nuts - 1 oz. has 15 mcg.  Pear - 1 medium has 8 mcg.  Peas (cooked) -  cup has 19 mcg.  Pickles - 1 spear has 14 mcg.  Pumpkin seeds - 1 oz. has 13 mcg.  Sauerkraut (canned) -  cup has 16 mcg.  Soybeans (cooked) -  cup has 16 mcg.  Tomato (raw) - 1 medium has 10 mcg.  Tomato sauce -  cup has 17 mcg.  Vitamin K-free foods If a food contain less than 5 mcg per serving, it is considered to have no vitamin K. These foods include:  Bread and cereal  products.  Cheese.  Eggs.  Fish and shellfish.  Meat and poultry.  Milk and dairy products.  Sunflower seeds.  Actual amounts of vitamin K in foods may be different depending on processing. Talk with your dietitian about what foods you can eat and what foods you should avoid. This information is not intended to replace advice given to you by your health care provider. Make sure you discuss any questions you have with your health care provider. Document Released: 07/17/2009 Document Revised: 04/10/2016 Document Reviewed: 12/23/2015 Elsevier Interactive Patient Education  2017 [employer].

## 2017-04-21 NOTE — Telephone Encounter (Signed)
Received call from [facility] with [facility]-they received rx for for vit K #10 with 3refills.  Due to the fact that #10 will cost the same as #30 ($200) pharmacy wanted to know if they could dispense #30 with 1 refill.  Discussed with DrGranfortuna-request was approved.Despina Hidden Cassady7/20/20184:23 PM

## 2017-04-22 LAB — CULTURE, BLOOD (ROUTINE X 2)
Culture: NO GROWTH
Culture: NO GROWTH
Special Requests: ADEQUATE

## 2017-04-24 ENCOUNTER — Encounter: Payer: Self-pay | Admitting: Oncology

## 2017-04-24 ENCOUNTER — Ambulatory Visit (INDEPENDENT_AMBULATORY_CARE_PROVIDER_SITE_OTHER): Payer: BLUE CROSS/BLUE SHIELD | Admitting: Oncology

## 2017-04-24 ENCOUNTER — Telehealth: Payer: Self-pay | Admitting: Family Medicine

## 2017-04-24 DIAGNOSIS — Z87891 Personal history of nicotine dependence: Secondary | ICD-10-CM | POA: Diagnosis not present

## 2017-04-24 DIAGNOSIS — D689 Coagulation defect, unspecified: Secondary | ICD-10-CM | POA: Diagnosis not present

## 2017-04-24 DIAGNOSIS — Z79899 Other long term (current) drug therapy: Secondary | ICD-10-CM | POA: Diagnosis not present

## 2017-04-24 LAB — CBC WITH DIFFERENTIAL/PLATELET
BASOS ABS: 0 10*3/uL (ref 0.0–0.1)
Basophils Relative: 0 %
Eosinophils Absolute: 0.3 10*3/uL (ref 0.0–0.7)
Eosinophils Relative: 5 %
HEMATOCRIT: 29.6 % — AB (ref 39.0–52.0)
Hemoglobin: 9.8 g/dL — ABNORMAL LOW (ref 13.0–17.0)
LYMPHS ABS: 0.7 10*3/uL (ref 0.7–4.0)
LYMPHS PCT: 12 %
MCH: 30.2 pg (ref 26.0–34.0)
MCHC: 33.1 g/dL (ref 30.0–36.0)
MCV: 91.4 fL (ref 78.0–100.0)
Monocytes Absolute: 0.7 10*3/uL (ref 0.1–1.0)
Monocytes Relative: 12 %
NEUTROS ABS: 4 10*3/uL (ref 1.7–7.7)
Neutrophils Relative %: 71 %
Platelets: 485 10*3/uL — ABNORMAL HIGH (ref 150–400)
RBC: 3.24 MIL/uL — AB (ref 4.22–5.81)
RDW: 14.6 % (ref 11.5–15.5)
WBC: 5.6 10*3/uL (ref 4.0–10.5)

## 2017-04-24 LAB — MISCELLANEOUS TEST

## 2017-04-24 LAB — APTT: APTT: 55 s — AB (ref 24–36)

## 2017-04-24 LAB — PROTIME-INR
INR: 1.99
Prothrombin Time: 22.9 seconds — ABNORMAL HIGH (ref 11.4–15.2)

## 2017-04-24 NOTE — Telephone Encounter (Signed)
Pt returned call and stated he is a lot better. Reconciled current and discharge medications. He states he has a good understanding of meds he is to take. He states he was taken off amlodipine in hospital but was place back on that today at office visit with Dr. Beryle Beams. Pt was reminded of appt with [facility] tomorrow and told to bring all medications with him. Pt verified understanding. After hour protocol was discussed and again pt verbalized understanding. Pt had his employer fax short term disability and FMLA paper work fax to me. I did receive and will place on pt's paper work for tomorrow's visit.

## 2017-04-24 NOTE — Progress Notes (Signed)
Hematology and Oncology Follow Up Visit  Jeffery [name] [id number] 11-14-1960 56 y.o. 04/24/2017 11:07 AM   Principle Diagnosis: Encounter Diagnosis  Name Primary?  . Coagulopathy ([place])     Interim History:   First post hospital visit for this 56 year old man who has been in overall excellent health except for treated hypertension. He presented with what I initially thought was a viral prodrome with a 3-4 day history of increasing nausea, epigastric abdominal discomfort, then intermittent vomiting without any fever, hematemesis, diarrhea, hematochezia, or melena. He noted increasing swelling of his feet and calves over the next few days. He then developed gross hematuria. Initial symptoms started on Thursday or Friday. Vomiting Sunday and Monday. Hematuria starting on Tuesday. He saw his family physician. He was initially felt to have a urinary tract infection and started on oral Cipro. He took an over the counter antiemetic called Emetrol. This is an inert carbohydrate-based drug. Over the next few days he noted profound fatigue, some spontaneous bruising, dyspnea on exertion, a strong heartbeat when he stood up. He presented to the emergency department on July 16 with these complaints. Laboratory studies remarkable for severe anemia with hemoglobin of 5.3, hematocrit 15%, platelet count 209,000. Pro time over 90 seconds. PTT over 200 seconds. He was not on any anticoagulants. He initially denied any other new medications or drug use. He had no history of liver disease and liver functions were normal. Bilirubin 0.7. LDH 123. At time of my initial exam and consultation on the day of admission, he had extensive ecchymoses in soft tissues including his calves and subcutaneous skin of his feet. Additional ecchymoses on his thighs bilaterally. A  CT scan of the abdomen done through the emergency department to rule out renal stones as etiology of the hematuria remarkable for retroperitoneal hemorrhage  with blood tracking down around the bladder and posterior paranephric retroperitoneal space, peri- ureteric and perivesical regions and the presacral space. Pending availability of special laboratory testing, he received a 10 mg IV dose of vitamin K and a dose of 4 factor clotting concentrate 25 units per kilogram (K centra). 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 There was near complete correction of the coagulopathy a few hours after receiving the Kcentra but within 24 hours, both pro time and PTT elevated again. Specific factor assays were done most remarkable for a factor VII of 2%. Factor VIII 310% corroborating the lack of a spontaneous inhibitor of coagulation which would most commonly be directed against factor VIII. Factors II, V, and X, all mid- range normal. A thrombin time to rule out a dysfibrinogenemia was normal. Quantitative fibrinogen level was normal. When I confronted the patient again with the fact that this appeared to be a severe factor deficiency which could not be explained by his diet, other medications, prolonged antibiotic use, liver disease, or an anticoagulant but that it was serious and life-threatening, and I questioned him again about recent reports showing that some over the counter cannabinoid preparations were found to contain a long-acting warfarin like anticoagulant, and asked whether he had used any of these products,  he admitted that in fact he had used one of these substances on a number of occasions most recently just a day or 2 prior to developing his initial symptoms. This particular compound has a very long half-life in the blood and some patients have had to have massive doses of vitamin K up to 100 mg daily until the  drug out of their system.  He received a second dose of Kcentra on the fourth hospital day. He required blood transfusions on admission and again on hospital day 3. Hemoglobin then  stabilized and was rising at time of discharge hospital day 5. Oral vitamin K given on a daily basis. Pro time 24.3 seconds, INR 2.15, PTT 55 on hospital day 5, July 20. No new areas of ecchymoses but older areas on his legs feet and heels became more clinically obvious as hematomas resolved. I thought it was safe enough to discharge him with close follow-up.  I am pleased to see his labs today which show further improvement in his hemoglobin up from 8.5 to 9.8 since July 20. PT and PTT remain abnormal but in the range of someone who is low therapeutic on warfarin with pro time 22.9 seconds, INR 2.0, PTT 55 seconds. At these levels, he should not experience any additional bleeding. Vitamin K increased to 10 mg daily at time of discharge on July 20.  His legs are still uncomfortable. He is ambulating. He is wearing elastic stockings. No hematuria since hospital admission. No more gum bleeding. No headaches. No hematochezia or melena.    Medications:  Current Outpatient Prescriptions:  .  B Complex-C (B-COMPLEX WITH VITAMIN C) tablet, Take 1 tablet by mouth daily., Disp: 60 tablet, Rfl: 0 .  ferrous sulfate 325 (65 FE) MG tablet, Take 1 tablet (325 mg total) by mouth 2 (two) times daily with a meal., Disp: 120 tablet, Rfl: 0 .  folic acid (FOLVITE) 1 MG tablet, Take 1 tablet (1 mg total) by mouth daily., Disp: 30 tablet, Rfl: 0 .  phytonadione (VITAMIN K) 5 MG tablet, Take 2 tablets (10 mg total) by mouth daily., Disp: 30 tablet, Rfl: 0 .  polyethylene glycol (MIRALAX / GLYCOLAX) packet, Take 17 g by mouth daily as needed., Disp: 14 each, Rfl: 0 .  thiamine 100 MG tablet, Take 1 tablet (100 mg total) by mouth daily., Disp: 30 tablet, Rfl: 0  Allergies:  Allergies  Allergen Reactions  . Penicillins Other (See Comments)    Hands & feet peel. "childhood allergy"    Review of Systems: See interim history Remaining ROS negative:   Physical Exam: Blood pressure (!) 141/86, pulse 92, temperature  98.2 F (36.8 C), temperature source Oral, height 6\' 3"  (1.905 m), weight 178 lb 14.4 oz (81.1 kg), SpO2 100 %. Wt Readings from Last 3 Encounters:  04/24/17 178 lb 14.4 oz (81.1 kg)  04/20/17 177 lb 14.4 oz (80.7 kg)  04/17/17 182 lb (82.6 kg)     General appearance: Tall thin Caucasian man  HENNT: Pharynx no erythema, exudate, mass, or ulcer. No thyromegaly or thyroid nodules Lymph nodes: No cervical, supraclavicular, or axillary lymphadenopathy Breasts:  Lungs: Clear to auscultation, resonant to percussion throughout Heart: Regular rhythm, no murmur, no gallop, no rub, no click, no edema Abdomen: Soft, nontender, normal bowel sounds, no mass, no organomegaly Extremities: No edema, no calf tenderness Musculoskeletal: no joint deformities GU:  Vascular: Carotid pulses 2+, no bruits, distal pulses: Dorsalis pedis 2+ symmetric Neurologic: Alert, oriented, PERRLA, optic discs sharp and vessels normal, no hemorrhage or exudate, cranial nerves grossly normal, motor strength 5 over 5, reflexes 1+ symmetric, upper body coordination normal, gait normal, Skin: Port wine stain. Birth Defect covering right neck and part of his face. Resolving extensive ecchymoses primarily lower extremities anterior and posterior calves, medial and lateral thighs.  Lab Results: CBC W/Diff    Component  Value Date/Time   WBC 5.6 04/24/2017 1018   RBC 3.24 (L) 04/24/2017 1018   HGB 9.8 (L) 04/24/2017 1018   HCT 29.6 (L) 04/24/2017 1018   PLT 485 (H) 04/24/2017 1018   MCV 91.4 04/24/2017 1018   MCH 30.2 04/24/2017 1018   MCHC 33.1 04/24/2017 1018   RDW 14.6 04/24/2017 1018   LYMPHSABS 0.7 04/24/2017 1018   MONOABS 0.7 04/24/2017 1018   EOSABS 0.3 04/24/2017 1018   BASOSABS 0.0 04/24/2017 1018     Chemistry      Component Value Date/Time   NA 138 04/20/2017 0516   K 3.4 (L) 04/20/2017 0516   CL 105 04/20/2017 0516   CO2 28 04/20/2017 0516   BUN 11 04/20/2017 0516   CREATININE 0.91 04/20/2017 0516    CREATININE 1.00 04/13/2017 1000      Component Value Date/Time   CALCIUM 9.1 04/20/2017 0516   ALKPHOS 54 04/20/2017 0516   AST 13 (L) 04/20/2017 0516   ALT 11 (L) 04/20/2017 0516   BILITOT 1.7 (H) 04/20/2017 0516    Factor VII: 21%, April 24, 2017 Prothrombin time 14.2 seconds patient control 19.2 seconds April 21, 2017   Radiological Studies:  04/17/2017 20:49    Impression:  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  I will continue the vitamin K supplements 10 mg daily and repeat lab again in one week. I sent off a factor VII level to compare with previous. He is advised to continue ferrous sulfate. He is okay to resume his antihypertensives at this time.   CC: Patient Care Team: Denita Lung, MD as PCP - General (Family Medicine)   Murriel Hopper, MD, [facility]  Hematology-Oncology/Internal Medicine     7/23/201811:07 AM

## 2017-04-24 NOTE — Telephone Encounter (Signed)
Left message for pt to call concerning recent hospital stay.  °

## 2017-04-24 NOTE — Patient Instructions (Signed)
Continue vitamin K and iron Lab in 1 week here at [facility] again and Dr visit in 4 weeks OK to resume your blood pressure medicine

## 2017-04-25 ENCOUNTER — Encounter: Payer: Self-pay | Admitting: Family Medicine

## 2017-04-25 ENCOUNTER — Ambulatory Visit (INDEPENDENT_AMBULATORY_CARE_PROVIDER_SITE_OTHER): Payer: BLUE CROSS/BLUE SHIELD | Admitting: Family Medicine

## 2017-04-25 ENCOUNTER — Other Ambulatory Visit: Payer: Self-pay | Admitting: Oncology

## 2017-04-25 VITALS — BP 120/72 | HR 71 | Wt 179.2 lb

## 2017-04-25 DIAGNOSIS — D689 Coagulation defect, unspecified: Secondary | ICD-10-CM | POA: Diagnosis not present

## 2017-04-25 NOTE — Patient Instructions (Signed)
Heat for 20 minutes 3 times per day and Tylenol for pain relief. Stay away from aspirin, Advil, Aleve

## 2017-04-25 NOTE — Discharge Summary (Signed)
[facility] Discharge Summary   Patient: Jeffery [name] MWN:[id number]   PCP: Denita Lung, MD DOB: Jan 26, 1961   Date of admission: 04/17/2017   Date of discharge: 04/21/2017    Discharge Diagnoses:  Active Problems:   Essential hypertension, benign   Anemia   Elevated INR   Hematuria   Petechiae  Admitted From: home Disposition:  home  Recommendations for Outpatient Follow-up:  1. Continue to follow-up with Dr. Winfred Leeds as recommended. 2. Follow-up with PCP.  Follow-up Information    Schedule an appointment as soon as possible for a visit with Denita Lung, MD.   Specialty:  Family Medicine Why:  Appointment date on 04/25/2017 at 9:15a Contact information: [place] [address] [phone]        Annia Belt, MD. Go on 04/24/2017.   Specialty:  Oncology Why:  Office will contact you to arrange a hospital follow up appointment. Thank you.  Contact information: [address] [place] [address] [phone]          Diet recommendation: regular diet  Activity: The patient is advised to gradually reintroduce usual activities.  Discharge Condition: good  Code Status: full code  History of present illness: As per the H and P dictated on admission, "Jeffery [name] is a 56 y.o. male with medical history significant of HTN, port-wine stain, alcohol use.  Patient had a burn on his foot from boiling water that healed well.  He then developed N/V starting a few weeks ago accompanied by abdominal pain.  No diarrhea, no fevers.  He then developed back pain and hematuria.  Went to PCP, U/A showed possible UTI and he was given Cipro.  He had 5 days of BRB in urine with clots during the time he feels that he passed a kidney stone.  He had chest pounding and Shortness of breath over the weekend.  Hematuria has resolved and he went to PCP today where he was found to have a Hgb of 5.  Lower extremity swelling and petechiae.  He  also has bruising on extremities that are in various stages of healing.   No blood in stools He does drink 5-6 alcoholic beverages per day.  He has been taking ibuprofen for pain as well.  In the ER, he was was found to have an INR > 10, PTT elevated, and Hgb of 5"  Hospital Course:  Summary of his active problems in the hospital is as following. Acute coagulopathy. Patient was seen by hematologist.  Patient was given vitamin K as well as KCENTRA clotting factors.  INR initially improved. Worsen again Since the workup including mixing studies, factor VII, factor VIII, factor X was performed. Fibrinogen level was normal. Prothrombin time was normal. On discussion with hematology patient mentioned that he has been using an cannabis products. One of the product has warfarin-like substance with a very long half life which can cause severe coagulopathy and will require increasing doses of vitamin K. Patient was discharged on oral vitamin K and B complex and iron with close follow-up with hematology.  Normocytic anemia, likely due to acute blood loss Patient had hematuria. CT scan suggested retroperitoneal hemorrhage as well. This is all likely secondary to his coagulopathy. He was transfused. Hemoglobin stable. We'll monitor.  Hematuria. Most likely secondary to coagulopathy. CT scan was done which showed the bilateral hydronephrosis with urothelial wall thickening in the right ureter and in the left collecting system.  He is making urine. His  creatinine is normal. His hematuria has resolved.  He will just need follow-up imaging studies once he has recovered from this acute illness. Discussed with Dr. Tresa Moore from urology who agrees. Recommends outpatient follow-up as needed.  Retroperitoneal hemorrhage Likely as a result of the coagulopathy which has now resolved. Currently hemodynamically stable. No back pain or flank pain.  Lower extremity edema Edema is most likely due to severe anemia  and acute illness. There could be an element of bleeding as well. However, he does have good peripheral pulses. No neurological deficits. No numbness. No concern for compartment syndrome at this time. This should resolve eventually. Lower extremity Dopplers are negative for DVT.  History of essential hypertension Currently off of his medicines. Monitor blood pressures closely. He is stable.   All other chronic medical condition were stable during the hospitalization.  Patient was ambulatory without any assistance. On the day of the discharge the patient's vitals were stable, and no other acute medical condition were reported by patient. the patient was felt safe to be discharge at home with family.  Procedures and Results:  [place]   Consultations:  Hematology  DISCHARGE MEDICATION: Discharge Medication List as of 04/21/2017 11:24 AM    START taking these medications   Details  B Complex-C (B-COMPLEX WITH VITAMIN C) tablet Take 1 tablet by mouth daily., Starting Sat 04/22/2017, Normal    ferrous sulfate 325 (65 FE) MG tablet Take 1 tablet (325 mg total) by mouth 2 (two) times daily with a meal., Starting Fri 4/31/5400, Normal    folic acid (FOLVITE) 1 MG tablet Take 1 tablet (1 mg total) by mouth daily., Starting Sat 04/22/2017, Normal    phytonadione (VITAMIN K) 5 MG tablet Take 2 tablets (10 mg total) by mouth daily., Starting Fri 04/21/2017, Normal    polyethylene glycol (MIRALAX / GLYCOLAX) packet Take 17 g by mouth daily as needed., Starting Fri 04/21/2017, Normal    thiamine 100 MG tablet Take 1 tablet (100 mg total) by mouth daily., Starting Sat 04/22/2017, Normal      STOP taking these medications     amLODipine (NORVASC) 5 MG tablet      ibuprofen (ADVIL,MOTRIN) 200 MG tablet      lisinopril-hydrochlorothiazide (PRINZIDE,ZESTORETIC) 20-12.5 MG tablet      Potassium (POTASSIMIN PO)        Allergies  Allergen Reactions  . Penicillins Other (See Comments)      Hands & feet peel. "childhood allergy"   Discharge Instructions    Diet - low sodium heart healthy    Complete by:  As directed    Discharge instructions    Complete by:  As directed    It is important that you read following instructions as well as go over your medication list with RN to help you understand your care after this hospitalization.  Discharge Instructions: Please follow-up with PCP in one week  Please request your primary care physician to go over all Hospital Tests and Procedure/Radiological results at the follow up,  Please get all Hospital records sent to your PCP by signing hospital release before you go home.   Do not take more than prescribed Pain, Sleep and Anxiety Medications. You were cared for by a hospitalist during your hospital stay. If you have any questions about your discharge medications or the care you received while you were in the hospital after you are discharged, you can call the unit and ask to speak with the hospitalist on call if the  hospitalist that took care of you is not available.  Once you are discharged, your primary care physician will handle any further medical issues. Please note that NO REFILLS for any discharge medications will be authorized once you are discharged, as it is imperative that you return to your primary care physician (or establish a relationship with a primary care physician if you do not have one) for your aftercare needs so that they can reassess your need for medications and monitor your lab values. You Must read complete instructions/literature along with all the possible adverse reactions/side effects for all the Medicines you take and that have been prescribed to you. Take any new Medicines after you have completely understood and accept all the possible adverse reactions/side effects. Wear Seat belts while driving. If you have smoked or chewed Tobacco in the last 2 yrs please stop smoking and/or stop any Recreational  drug use.   Increase activity slowly    Complete by:  As directed      Discharge Exam: Filed Weights   04/18/17 2137 04/19/17 2134 04/20/17 2027  Weight: 82.6 kg (182 lb 3.2 oz) 82.6 kg (182 lb) 80.7 kg (177 lb 14.4 oz)   Vitals:   04/21/17 0455 04/21/17 0800  BP: 132/85 133/79  Pulse: 91 90  Resp: 18 16  Temp: 98.7 F (37.1 C) 98.3 F (36.8 C)   General: Appear in no distress, no Rash; Oral Mucosa moist. Cardiovascular: S1 and S2 Present, no Murmur, no JVD Respiratory: Bilateral Air entry present and Clear to Auscultation, no Crackles, no wheezes Abdomen: Bowel Sound present, Soft and no tenderness Extremities: bilateral Pedal edema, no calf tenderness Neurology: Grossly no focal neuro deficit.  The results of significant diagnostics from this hospitalization (including imaging, microbiology, ancillary and laboratory) are listed below for reference.    Significant Diagnostic Studies: Ct Renal Stone Study  Result Date: 04/17/2017 CLINICAL DATA:  Hematuria. Back and abdominal discomfort. History of nephrolithiasis. EXAM: CT ABDOMEN AND PELVIS WITHOUT CONTRAST TECHNIQUE: Multidetector CT imaging of the abdomen and pelvis was performed following the standard protocol without IV contrast. COMPARISON:  04/23/2010 abdominal sonogram. FINDINGS: Lower chest: No significant pulmonary nodules or acute consolidative airspace disease. Hepatobiliary: Normal liver size. Hypodense subcentimeter left lower lobe lesion, too small to characterize, for which no further follow-up is required unless the patient has risk factors for liver malignancy. No additional liver lesions. Normal gallbladder with no radiopaque cholelithiasis. No biliary ductal dilatation. Pancreas: Normal, with no mass or duct dilation. Spleen: Normal size. No mass. Adrenals/Urinary Tract: Normal adrenals. Moderate right hydronephrosis. Mild left hydronephrosis. There is prominent hyperdense urothelial wall thickening throughout the  right ureter, most prominent in the proximal lumbar segment. There is prominent hyperdense urothelial wall thickening throughout the central left renal collecting system and lumbar segment of the left ureter. Punctate nonobstructing 2 mm upper right renal stone. No additional renal stones. No ureteral stones. No contour deforming renal masses. No bladder stones or focal bladder abnormalities. Relatively underdistended bladder with borderline mild diffuse bladder wall thickening. Extensive heterogeneously hyperdense fluid throughout the bilateral anterior and posterior paranephric retroperitoneal spaces, periureteric and perivesical regions and presacral space. Stomach/Bowel: Grossly normal stomach. Normal caliber small bowel with no small bowel wall thickening. Normal appendix. Mild diverticulosis of the descending and sigmoid colon, with no large bowel wall thickening. Vascular/Lymphatic: Atherosclerotic nonaneurysmal abdominal aorta. No pathologically enlarged lymph nodes in the abdomen or pelvis. Reproductive: Top-normal size prostate with nonspecific internal prostatic calcifications . Other: No pneumoperitoneum, ascites or  focal fluid collection. Musculoskeletal: No aggressive appearing focal osseous lesions. Minimal lumbar spondylosis. IMPRESSION: 1. Moderate right hydronephrosis. Mild left hydronephrosis. Extensive irregular hyperdense urothelial wall thickening throughout the right ureter and throughout the central left renal collecting system and lumbar segment of the left ureter. Upper tract urothelial neoplasm cannot be excluded on either side. Urology consultation advised. Hematuria protocol CT abdomen/pelvis without and with IV contrast may be useful for further characterization. 2. Solitary punctate nonobstructing upper right renal stone. No additional renal stones. No ureteral or bladder stones. 3. Extensive hemorrhagic fluid throughout the bilateral retroperitoneal and extraperitoneal pelvic spaces,  with layering hemorrhage in the presacral space. 4. Mild left colonic diverticulosis, with no evidence of acute diverticulitis. 5.  Aortic Atherosclerosis (ICD10-I70.0). Electronically Signed   By: Ilona Sorrel M.D.   On: 04/17/2017 20:49    Microbiology: Recent Results (from the past 240 hour(s))  Urine culture     Status: None   Collection Time: 04/17/17  3:41 PM  Result Value Ref Range Status   Specimen Description URINE, RANDOM  Final   Special Requests NONE  Final   Culture NO GROWTH  Final   Report Status 04/18/2017 FINAL  Final  Culture, blood (routine x 2)     Status: None   Collection Time: 04/17/17  4:42 PM  Result Value Ref Range Status   Specimen Description BLOOD RIGHT ANTECUBITAL  Final   Special Requests   Final    BOTTLES DRAWN AEROBIC AND ANAEROBIC Blood Culture results may not be optimal due to an excessive volume of blood received in culture bottles   Culture NO GROWTH 5 DAYS  Final   Report Status 04/22/2017 FINAL  Final  Culture, blood (Routine X 2) w Reflex to ID Panel     Status: None   Collection Time: 04/17/17  5:02 PM  Result Value Ref Range Status   Specimen Description BLOOD BLOOD RIGHT HAND  Final   Special Requests   Final    Blood Culture adequate volume BOTTLES DRAWN AEROBIC AND ANAEROBIC   Culture NO GROWTH 5 DAYS  Final   Report Status 04/22/2017 FINAL  Final  MRSA PCR Screening     Status: None   Collection Time: 04/17/17  7:06 PM  Result Value Ref Range Status   MRSA by PCR NEGATIVE NEGATIVE Final    Comment:        The GeneXpert MRSA Assay (FDA approved for NASAL specimens only), is one component of a comprehensive MRSA colonization surveillance program. It is not intended to diagnose MRSA infection nor to guide or monitor treatment for MRSA infections.      Labs: CBC:  Recent Labs Lab 04/19/17 0504 04/20/17 0516 04/21/17 0509 04/24/17 1018  WBC 6.9 7.3 8.8 5.6  NEUTROABS 4.4 4.6 5.9 4.0  HGB 5.9* 7.9* 8.5* 9.8*  HCT 16.9*  23.0* 25.3* 29.6*  MCV 88.0 88.5 90.0 91.4  PLT 231 306 353 443*   Basic Metabolic Panel:  Recent Labs Lab 04/19/17 0504 04/20/17 0516  NA 135 138  K 3.6 3.4*  CL 103 105  CO2 24 28  GLUCOSE 99 103*  BUN 12 11  CREATININE 0.92 0.91  CALCIUM 8.7* 9.1   Liver Function Tests:  Recent Labs Lab 04/20/17 0516  AST 13*  ALT 11*  ALKPHOS 54  BILITOT 1.7*  PROT 5.5*  ALBUMIN 3.1*   Time spent: 35 minutes  Signed:  Court Gracia  [facility] 04/21/2017 , 1:52 PM

## 2017-04-25 NOTE — Progress Notes (Signed)
   Subjective:    Patient ID: Jeffery [name], male    DOB: 10-Oct-1960, 56 y.o.   MRN: [id number]  HPI He is here for a follow-up visit after recent hospitalization for evaluation and treatment of bleeding. He was subsequently found to do have a coagulopathy. For more details on this, see the note from Dr. Beryle Beams. He was seen yesterday in the blood work was reviewed. Presently he is having difficulty mainly from discomfort in his lower extremities especially his feet when he tries to stand. He does have a follow-up appointment with Dr. Beryle Beams in approximately one week to follow-up on his abnormal clotting factors. Presently he is unable to work because of the pain and discoloration on his lower extremities. Review of Systems     Objective:   Physical Exam Alert and in no distress exam of his lower extremities does show swelling and purpura with some pain on palpation.       Assessment & Plan:  Coagulopathy ([place]) I recommended that he stay physically active to try and help mobilize the blood. Recommend heat 20 minutes 3 times per day to help with this. Tylenol for pain but no anti-inflammatories. I explained that it would take several weeks for his bike to clear all the blood. He will check here again in one week and possibly be able to return to work based on the amount of pain that he is having. Dr. Azucena Freed help is greatly appreciated.

## 2017-04-27 NOTE — Addendum Note (Signed)
Addended by: Truddie Crumble on: 04/27/2017 01:59 PM   Modules accepted: Orders

## 2017-04-28 LAB — MISCELLANEOUS TEST

## 2017-04-28 LAB — FACTOR VIII (NCBH)

## 2017-05-01 ENCOUNTER — Other Ambulatory Visit (INDEPENDENT_AMBULATORY_CARE_PROVIDER_SITE_OTHER): Payer: BLUE CROSS/BLUE SHIELD

## 2017-05-01 DIAGNOSIS — D689 Coagulation defect, unspecified: Secondary | ICD-10-CM | POA: Diagnosis not present

## 2017-05-01 LAB — CBC WITH DIFFERENTIAL/PLATELET
BASOS PCT: 1 %
Basophils Absolute: 0 10*3/uL (ref 0.0–0.1)
EOS ABS: 0.2 10*3/uL (ref 0.0–0.7)
EOS PCT: 4 %
HEMATOCRIT: 33.7 % — AB (ref 39.0–52.0)
Hemoglobin: 11 g/dL — ABNORMAL LOW (ref 13.0–17.0)
Lymphocytes Relative: 24 %
Lymphs Abs: 1.2 10*3/uL (ref 0.7–4.0)
MCH: 30.2 pg (ref 26.0–34.0)
MCHC: 32.6 g/dL (ref 30.0–36.0)
MCV: 92.6 fL (ref 78.0–100.0)
MONO ABS: 0.4 10*3/uL (ref 0.1–1.0)
MONOS PCT: 8 %
NEUTROS ABS: 3.2 10*3/uL (ref 1.7–7.7)
Neutrophils Relative %: 64 %
Platelets: 556 10*3/uL — ABNORMAL HIGH (ref 150–400)
RBC: 3.64 MIL/uL — ABNORMAL LOW (ref 4.22–5.81)
RDW: 14.3 % (ref 11.5–15.5)
WBC: 5.1 10*3/uL (ref 4.0–10.5)

## 2017-05-01 LAB — PROTIME-INR
INR: 1.69
Prothrombin Time: 20.1 seconds — ABNORMAL HIGH (ref 11.4–15.2)

## 2017-05-02 ENCOUNTER — Ambulatory Visit (INDEPENDENT_AMBULATORY_CARE_PROVIDER_SITE_OTHER): Payer: BLUE CROSS/BLUE SHIELD | Admitting: Family Medicine

## 2017-05-02 ENCOUNTER — Encounter: Payer: Self-pay | Admitting: Family Medicine

## 2017-05-02 ENCOUNTER — Other Ambulatory Visit: Payer: Self-pay | Admitting: Oncology

## 2017-05-02 VITALS — BP 90/60 | Ht 75.0 in | Wt 176.0 lb

## 2017-05-02 DIAGNOSIS — D689 Coagulation defect, unspecified: Secondary | ICD-10-CM

## 2017-05-02 NOTE — Patient Instructions (Signed)
Continue to use Tylenol and check with Dr. Beryle Beams on his thoughts concerning the use of Advil or Aleve with the Tylenol

## 2017-05-02 NOTE — Progress Notes (Signed)
   Subjective:    Patient ID: Jeffery [name], male    DOB: 1961/07/20, 56 y.o.   MRN: [id number]  HPI He is here for a recheck. He is slowly getting better but still having a lot of difficulty with leg pain and has concerns over this interfering with his work.   Review of Systems     Objective:   Physical Exam Alert and in no distress. Ecchymotic areas present on his lower extremities have greatly diminished. No tenderness to palpation of the calf with good motion of the ankle. No edema is noted.       Assessment & Plan:  Coagulopathy ([place])  I will write for him to return to work next Monday. Strongly encouraged him to slowly get himself" work ready". I by spending as much time as possible standing. He will return here as needed.

## 2017-05-04 LAB — MISCELLANEOUS TEST

## 2017-05-05 ENCOUNTER — Other Ambulatory Visit: Payer: Self-pay | Admitting: Family Medicine

## 2017-05-05 DIAGNOSIS — I10 Essential (primary) hypertension: Secondary | ICD-10-CM

## 2017-05-05 NOTE — Telephone Encounter (Signed)
Are these okay to refill? 

## 2017-05-10 NOTE — Addendum Note (Signed)
Addended by: Truddie Crumble on: 05/10/2017 11:40 AM   Modules accepted: Orders

## 2017-05-15 ENCOUNTER — Other Ambulatory Visit: Payer: BLUE CROSS/BLUE SHIELD

## 2017-05-15 ENCOUNTER — Other Ambulatory Visit: Payer: Self-pay | Admitting: Oncology

## 2017-05-15 ENCOUNTER — Other Ambulatory Visit (INDEPENDENT_AMBULATORY_CARE_PROVIDER_SITE_OTHER): Payer: BLUE CROSS/BLUE SHIELD

## 2017-05-15 ENCOUNTER — Other Ambulatory Visit: Payer: Self-pay | Admitting: *Deleted

## 2017-05-15 DIAGNOSIS — D689 Coagulation defect, unspecified: Secondary | ICD-10-CM | POA: Diagnosis not present

## 2017-05-15 LAB — CBC WITH DIFFERENTIAL/PLATELET
BASOS ABS: 0 10*3/uL (ref 0.0–0.1)
Basophils Relative: 1 %
EOS PCT: 6 %
Eosinophils Absolute: 0.2 10*3/uL (ref 0.0–0.7)
HEMATOCRIT: 36.8 % — AB (ref 39.0–52.0)
HEMOGLOBIN: 12.3 g/dL — AB (ref 13.0–17.0)
LYMPHS ABS: 1.5 10*3/uL (ref 0.7–4.0)
LYMPHS PCT: 40 %
MCH: 31.1 pg (ref 26.0–34.0)
MCHC: 33.4 g/dL (ref 30.0–36.0)
MCV: 92.9 fL (ref 78.0–100.0)
Monocytes Absolute: 0.4 10*3/uL (ref 0.1–1.0)
Monocytes Relative: 11 %
NEUTROS ABS: 1.6 10*3/uL — AB (ref 1.7–7.7)
NEUTROS PCT: 42 %
Platelets: 280 10*3/uL (ref 150–400)
RBC: 3.96 MIL/uL — AB (ref 4.22–5.81)
RDW: 13.8 % (ref 11.5–15.5)
WBC: 3.8 10*3/uL — AB (ref 4.0–10.5)

## 2017-05-15 LAB — PROTIME-INR
INR: 1.31
Prothrombin Time: 16.4 seconds — ABNORMAL HIGH (ref 11.4–15.2)

## 2017-05-15 MED ORDER — PHYTONADIONE 5 MG PO TABS: 10.0000 mg | ORAL_TABLET | Freq: Every day | ORAL | 0 refills | Status: DC

## 2017-05-15 NOTE — Telephone Encounter (Signed)
He does not need the folic acid. I will renew the vitamin K. He got Rx filled at [facility] - his local pharmacy did not have it.

## 2017-05-16 ENCOUNTER — Telehealth: Payer: Self-pay | Admitting: *Deleted

## 2017-05-16 NOTE — Telephone Encounter (Signed)
Left msg on voicemail regarding meds (folic acid, Vit K)

## 2017-05-16 NOTE — Telephone Encounter (Signed)
Call from pt - informed "lab looks good. Clotting time down to 16.4 seconds just 1.2 seconds over upper normal. Hemoglobin improving - up to 12.3. Stay on the vitamin K. We can skip next week and check lab when he sees me on the 27th" per Dr Beryle Beams. Voiced understanding; reminded of appt.

## 2017-05-16 NOTE — Telephone Encounter (Signed)
Called pt - no answer; left message to give me a call back. 

## 2017-05-16 NOTE — Telephone Encounter (Signed)
-----   Message from Annia Belt, MD sent at 05/15/2017 10:44 AM EDT ----- Call pt: lab looks good. Clotting time down to 16.4 seconds just 1.2 seconds over upper normal. Hemoglobin improving - up to 12.3. Stay on the vitamin K. We can skip next week and check lab when he sees me on the 27th

## 2017-05-29 ENCOUNTER — Ambulatory Visit: Payer: BLUE CROSS/BLUE SHIELD | Admitting: Oncology

## 2017-05-29 ENCOUNTER — Other Ambulatory Visit (INDEPENDENT_AMBULATORY_CARE_PROVIDER_SITE_OTHER): Payer: BLUE CROSS/BLUE SHIELD

## 2017-05-29 DIAGNOSIS — D689 Coagulation defect, unspecified: Secondary | ICD-10-CM

## 2017-05-29 LAB — PROTIME-INR
INR: 1
PROTHROMBIN TIME: 13.2 s (ref 11.4–15.2)

## 2017-05-29 NOTE — Addendum Note (Signed)
Addended by: Orson Gear on: 05/29/2017 09:09 AM   Modules accepted: Orders

## 2017-05-30 ENCOUNTER — Telehealth: Payer: Self-pay | Admitting: *Deleted

## 2017-05-30 NOTE — Telephone Encounter (Signed)
Pt informed "clotting time back to normal (@ 13.2).He can finish vitamin K that he has, then stop " per Dr Beryle Beams. Wanted to know what exactly PT and INR means - explained  PT is how long it takes blood to clot and INR states for international normalized ratio which monitor how well the blood-thinning medication is working.

## 2017-05-30 NOTE — Telephone Encounter (Signed)
-----   Message from Annia Belt, MD sent at 05/30/2017  9:15 AM EDT ----- Call pt: clotting time back to normal  He can nfinish vitamin K that he has then stop

## 2017-05-30 NOTE — Telephone Encounter (Signed)
Called pt - no answer; left message to give me a call back. 

## 2017-06-26 ENCOUNTER — Telehealth: Payer: Self-pay | Admitting: *Deleted

## 2017-06-26 ENCOUNTER — Encounter: Payer: Self-pay | Admitting: Oncology

## 2017-06-26 ENCOUNTER — Ambulatory Visit (INDEPENDENT_AMBULATORY_CARE_PROVIDER_SITE_OTHER): Payer: BLUE CROSS/BLUE SHIELD | Admitting: Oncology

## 2017-06-26 VITALS — BP 111/61 | HR 78 | Temp 98.0°F | Ht 75.0 in | Wt 184.3 lb

## 2017-06-26 DIAGNOSIS — D688 Other specified coagulation defects: Secondary | ICD-10-CM | POA: Diagnosis not present

## 2017-06-26 DIAGNOSIS — Q825 Congenital non-neoplastic nevus: Secondary | ICD-10-CM

## 2017-06-26 DIAGNOSIS — Z880 Allergy status to penicillin: Secondary | ICD-10-CM | POA: Diagnosis not present

## 2017-06-26 DIAGNOSIS — T604X4D Toxic effect of rodenticides, undetermined, subsequent encounter: Secondary | ICD-10-CM | POA: Diagnosis not present

## 2017-06-26 DIAGNOSIS — D689 Coagulation defect, unspecified: Secondary | ICD-10-CM

## 2017-06-26 LAB — CBC WITH DIFFERENTIAL/PLATELET
Basophils Absolute: 0 10*3/uL (ref 0.0–0.1)
Basophils Relative: 1 %
EOS ABS: 0.3 10*3/uL (ref 0.0–0.7)
Eosinophils Relative: 6 %
HCT: 40.6 % (ref 39.0–52.0)
HEMOGLOBIN: 13.6 g/dL (ref 13.0–17.0)
LYMPHS ABS: 1.4 10*3/uL (ref 0.7–4.0)
Lymphocytes Relative: 25 %
MCH: 30.3 pg (ref 26.0–34.0)
MCHC: 33.5 g/dL (ref 30.0–36.0)
MCV: 90.4 fL (ref 78.0–100.0)
Monocytes Absolute: 0.9 10*3/uL (ref 0.1–1.0)
Monocytes Relative: 16 %
NEUTROS PCT: 52 %
Neutro Abs: 2.8 10*3/uL (ref 1.7–7.7)
Platelets: 234 10*3/uL (ref 150–400)
RBC: 4.49 MIL/uL (ref 4.22–5.81)
RDW: 12.7 % (ref 11.5–15.5)
WBC: 5.4 10*3/uL (ref 4.0–10.5)

## 2017-06-26 LAB — PROTIME-INR
INR: 1.01
PROTHROMBIN TIME: 13.2 s (ref 11.4–15.2)

## 2017-06-26 NOTE — Progress Notes (Signed)
Hematology and Oncology Follow Up Visit  Jeffery [name] [id number] 10-Oct-1960 56 y.o. 06/26/2017 10:33 AM   Principle Diagnosis: Encounter Diagnosis  Name Primary?  . 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 This is a long-acting rat poison that has been recently found as a contaminant in some over the counter cannabinoids. It has a very long serum half-life on the order of months. It works on the same enzyme system in the liver as warfarin and can be reversed with vitamin K and with factor concentrates containing factor VII. In addition to extensive soft tissue bleeding he also had retroperitoneal bleeding. He received transfusions, vitamin K, and K Centra 4 factor clotting concentrate. He required maintenance vitamin K for about one month. I monitored coag status and factor VII levels.  a specimen of blood was sent to a state reference lab in [place] and confirmed presence of brodifacoum. Coagulation status slowly stabilized. He was instructed to stop the vitamin K supplements about 3 weeks ago. I am pleased to report his coag status is normal today pro time 13.2 seconds, INR 1.01.   This whole incident made a huge impact on him. Not only has he stopped all non-alcohol substance use, he has dramatically decreased on his beer drinking. He has had no further bleeding or new bruising. He does have some areas on his extremities where there is residual discoloration of the skin from previous intramuscular/subcutaneous ecchymoses. He has had no further flank pain, hematuria, or rectal bleeding.  Medications: reviewed  Allergies:  Allergies  Allergen Reactions  . Penicillins Other (See Comments)    Hands & feet peel. "childhood allergy"    Review of Systems:  Remaining ROS negative:   Physical Exam: Blood  pressure 111/61, pulse 78, temperature 98 F (36.7 C), temperature source Oral, height 6\' 3"  (1.905 m), weight 184 lb 4.8 oz (83.6 kg), SpO2 99 %. Wt Readings from Last 3 Encounters:  06/26/17 184 lb 4.8 oz (83.6 kg)  05/02/17 176 lb (79.8 kg)  04/25/17 179 lb 3.2 oz (81.3 kg)     General appearance: Well-nourished Caucasian man HENNT: Pharynx no erythema, exudate, mass, or ulcer. No thyromegaly or thyroid nodules Lymph nodes: No cervical, supraclavicular, or axillary lymphadenopathy Breasts:  Lungs: Clear to auscultation, resonant to percussion throughout Heart: Regular rhythm, no murmur, no gallop, no rub, no click, no edema Abdomen: Soft, nontender, normal bowel sounds, no mass, no organomegaly Extremities: No edema, no calf tenderness Musculoskeletal: no joint deformities GU:  Vascular: Carotid pulses 2+, no bruits,  Neurologic: Alert, oriented, PERRLA, cranial nerves grossly normal, motor strength 5 over 5, reflexes 1+ symmetric, upper body coordination normal, gait normal, Skin: No rash; birthmark - port wine stain - right face/neck; some residual discoloration in areas of previous soft tissue hematomas posterior calf left side.  Lab Results: CBC W/Diff    Component Value Date/Time   WBC 5.4 06/26/2017 0910   RBC 4.49 06/26/2017 0910   HGB 13.6 06/26/2017 0910   HCT 40.6 06/26/2017 0910   PLT 234 06/26/2017 0910   MCV 90.4 06/26/2017 0910   MCH 30.3 06/26/2017 0910   MCHC 33.5 06/26/2017 0910   RDW 12.7 06/26/2017 0910   LYMPHSABS 1.4 06/26/2017 0910   MONOABS 0.9 06/26/2017 0910   EOSABS 0.3 06/26/2017 0910   BASOSABS 0.0 06/26/2017 0910  Chemistry      Component Value Date/Time   NA 138 04/20/2017 0516   K 3.4 (L) 04/20/2017 0516   CL 105 04/20/2017 0516   CO2 28 04/20/2017 0516   BUN 11 04/20/2017 0516   CREATININE 0.91 04/20/2017 0516   CREATININE 1.00 04/13/2017 1000      Component Value Date/Time   CALCIUM 9.1 04/20/2017 0516   ALKPHOS 54 04/20/2017  0516   AST 13 (L) 04/20/2017 0516   ALT 11 (L) 04/20/2017 0516   BILITOT 1.7 (H) 04/20/2017 0516       Radiological Studies: No results found.  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  CC: Patient Care Team: Denita Lung, MD as PCP - General (Family Medicine)   Murriel Hopper, MD, [place]  Hematology-Oncology/Internal Medicine     9/24/201810:33 AM

## 2017-06-26 NOTE — Telephone Encounter (Signed)
Pt had returned my call -called pt, no answer; left message "clotting time now normal! anemia resolved " per Dr Beryle Beams. And call for any questions.

## 2017-06-26 NOTE — Telephone Encounter (Signed)
-----   Message from Annia Belt, MD sent at 06/26/2017 10:05 AM EDT ----- Call pt: clotting time now normal! anemia resolved

## 2017-06-26 NOTE — Telephone Encounter (Signed)
Called pt - no answer; left message to give me a call back. 

## 2017-06-26 NOTE — Patient Instructions (Signed)
To lab today MD visit: Return as needed

## 2017-11-13 NOTE — Progress Notes (Signed)
[facility] [address] [place], [place] [address] Phone: [phone] Subjective:    CC:  Shoulder and foot pain   [id number]  Jeffery [name] is a 57 y.o. male coming in with complaint of   Shoulder pain right history of frozen shoulder previously.  Has also had a calcific bursitis.  Was on the left side though.  Patient states he has right shoulder pain that feels exactly as the previous pain in right shoulder.  Onset- 2-4 months  Location-more posterior of the shoulder Duration-months Character-dull ache with decreasing range of motion Aggravating factors- flexion, abduction Reliving factors-not heavy lifting Therapies tried-  Severity-6 out of 10 and worsening  Foot pain. Wears steal toe shoes. Has a little numbness. States his toe doesn't feel right. States it doesn't feel right. The shoes he wears feel like he's walking on flat feet.   Onset- November Location- 2nd toe Duration- Hurt at works  Character- Dull Aggravating factors- walking Reliving factors-  Therapies tried-  Severity-3 out of 10.     Past Medical History:  Diagnosis Date  . Anemia   . Central serous retinopathy    hx/o intraocular injection therapy for 3 years; no change after 3 years of therapy, no visual c/o  . Depression   . Former smoker   . History of blood transfusion 04/17/2017   "related to anemia"  . History of kidney stones   . Hypertension   . Left varicocele   . Port-wine stain of face   . Pre-diabetes    Past Surgical History:  Procedure Laterality Date  . COLONOSCOPY     never  . EXTRACORPOREAL SHOCK WAVE LITHOTRIPSY    . PALATE / UVULA BIOPSY / EXCISION  2006   "polyp on uvula cut out"   Social History   Socioeconomic History  . Marital status: Divorced    Spouse name: None  . Number of children: None  . Years of education: None  . Highest education level: None  Social Needs  . Financial resource strain: None  . Food  insecurity - worry: None  . Food insecurity - inability: None  . Transportation needs - medical: None  . Transportation needs - non-medical: None  Occupational History  . None  Tobacco Use  . Smoking status: Former Smoker    Packs/day: 1.00    Years: 15.00    Pack years: 15.00    Types: Cigarettes    Last attempt to quit: 12/01/1997    Years since quitting: 19.9  . Smokeless tobacco: Never Used  Substance and Sexual Activity  . Alcohol use: Yes    Comment: Beer.  . Drug use: No  . Sexual activity: Not Currently  Other Topics Concern  . None  Social History Narrative   Divorced 04/2012, 2 children ages daughter 60yo, son 92yo; exercise - moving on the job, walking, works as Electrical engineer   Allergies  Allergen Reactions  . Penicillins Other (See Comments)    Hands & feet peel. "childhood allergy"   Family History  Problem Relation Age of Onset  . Heart disease Mother        pacemaker  . Heart disease Father 73       died of MI, 23s  . Diabetes Father   . Stroke Father   . Migraines Sister   . Mental illness Sister   . Cancer Neg Hx   . Hyperlipidemia Neg Hx   . Hypertension Neg Hx   .  Colon cancer Neg Hx      Past medical history, social, surgical and family history all reviewed in electronic medical record.  No pertanent information unless stated regarding to the chief complaint.   Review of Systems:Review of systems updated and as accurate as of 11/14/17  No headache, visual changes, nausea, vomiting, diarrhea, constipation, dizziness, abdominal pain, skin rash, fevers, chills, night sweats, weight loss, swollen lymph nodes, body aches, joint swelling, muscle aches, chest pain, shortness of breath, mood changes.  Positive muscle aches  Objective  Blood pressure 140/84, pulse 80, height 6\' 3"  (1.905 m), weight 197 lb (89.4 kg), SpO2 98 %. Systems examined below as of 11/14/17   General: No apparent distress alert and oriented x3 mood and affect normal, dressed  appropriately.  HEENT: Pupils equal, extraocular movements intact  Respiratory: Patient's speak in full sentences and does not appear short of breath  Cardiovascular: No lower extremity edema, non tender, no erythema  Skin: Warm dry intact with no signs of infection or rash on extremities or on axial skeleton.  Abdomen: Soft nontender  Neuro: Cranial nerves II through XII are intact, neurovascularly intact in all extremities with 2+ DTRs and 2+ pulses.  Lymph: No lymphadenopathy of posterior or anterior cervical chain or axillae bilaterally.  Gait normal with good balance and coordination.  MSK:  Non tender with full range of motion and good stability and symmetric strength and tone of elbows, wrist, hip, knee and ankles bilaterally.  Shoulder: Right Inspection reveals no abnormalities, atrophy or asymmetry. Palpation is normal with no tenderness over AC joint or bicipital groove. ROM i is restricted somewhat.  Patient is decreasing range of motion lacking last 5-10 degrees of external rotation and internal rotation. Rotator cuff strength normal throughout. Mild impingement Speeds and Yergason's tests normal. No labral pathology noted with negative Obrien's, negative clunk and good stability. Normal scapular function observed. No painful arc and no drop arm sign. No apprehension sign  Contralateral shoulder unremarkable. Foot exam shows the patient does have mild pes planus with over pronation of the hindfoot.  Patient has severe breakdown of the transverse arch with patient having mild subluxation with tibial deviation of the second toe.  Procedure: Real-time Ultrasound Guided Injection of right glenohumeral joint Device: GE Logiq Q7  Ultrasound guided injection is preferred based studies that show increased duration, increased effect, greater accuracy, decreased procedural pain, increased response rate with ultrasound guided versus blind injection.  Verbal informed consent obtained.    Time-out conducted.  Noted no overlying erythema, induration, or other signs of local infection.  Skin prepped in a sterile fashion.  Local anesthesia: Topical Ethyl chloride.  With sterile technique and under real time ultrasound guidance:  Joint visualized.  23g 1  inch needle inserted posterior approach. Pictures taken for needle placement. Patient did have injection of 2 cc of 1% lidocaine, 2 cc of 0.5% Marcaine, and 1.0 cc of Kenalog 40 mg/dL. Completed without difficulty  Pain immediately resolved suggesting accurate placement of the medication.  Advised to call if fevers/chills, erythema, induration, drainage, or persistent bleeding.  Images permanently stored and available for review in the ultrasound unit.  Impression: Technically successful ultrasound guided injection.   97110; 15 additional minutes spent for Therapeutic exercises as stated in above notes.  This included exercises focusing on stretching, strengthening, with significant focus on eccentric aspects.   Long term goals include an improvement in range of motion, strength, endurance as well as avoiding reinjury. Patient's frequency would include in 1-2  times a day, 3-5 times a week for a duration of 6-12 weeks. Shoulder Exercises that included:  Basic scapular stabilization to include adduction and depression of scapula Scaption, focusing on proper movement and good control Internal and External rotation utilizing a theraband, with elbow tucked at side entire time Rows with theraband which was given   Proper technique shown and discussed handout in great detail with ATC.  All questions were discussed and answered.     Impression and Recommendations:     This case required medical decision making of moderate complexity.      Note: This dictation was prepared with Dragon dictation along with smaller phrase technology. Any transcriptional errors that result from this process are unintentional.

## 2017-11-14 ENCOUNTER — Ambulatory Visit (INDEPENDENT_AMBULATORY_CARE_PROVIDER_SITE_OTHER): Payer: BLUE CROSS/BLUE SHIELD | Admitting: Family Medicine

## 2017-11-14 ENCOUNTER — Ambulatory Visit: Payer: Self-pay

## 2017-11-14 ENCOUNTER — Encounter: Payer: Self-pay | Admitting: Family Medicine

## 2017-11-14 VITALS — BP 140/84 | HR 80 | Ht 75.0 in | Wt 197.0 lb

## 2017-11-14 DIAGNOSIS — G8929 Other chronic pain: Secondary | ICD-10-CM

## 2017-11-14 DIAGNOSIS — M25511 Pain in right shoulder: Principal | ICD-10-CM

## 2017-11-14 DIAGNOSIS — M216X1 Other acquired deformities of right foot: Secondary | ICD-10-CM | POA: Insufficient documentation

## 2017-11-14 DIAGNOSIS — M7501 Adhesive capsulitis of right shoulder: Secondary | ICD-10-CM

## 2017-11-14 DIAGNOSIS — M7500 Adhesive capsulitis of unspecified shoulder: Secondary | ICD-10-CM | POA: Insufficient documentation

## 2017-11-14 MED ORDER — VITAMIN D (ERGOCALCIFEROL) 1.25 MG (50000 UNIT) PO CAPS: 50000.0000 [IU] | ORAL_CAPSULE | ORAL | 0 refills | Status: DC

## 2017-11-14 NOTE — Patient Instructions (Addendum)
Good to see you  Ice 20 minutes 2 times daily. Usually after activity and before bed. Exercises 3 times a week.  pennsaid pinkie amount topically 2 times daily as needed.   Vitam in D 1 time a week for 12 weeks.  Keep pushing the shoulder  For the foot we will call you with orthotics.  See me again in 4 weeks

## 2017-11-14 NOTE — Assessment & Plan Note (Signed)
Frozen shoulder.  Given injection because it did so on the contralateral side once weekly vitamin D again, icing regimen, home exercise, work with Product/process development scientist.  Patient will follow up with me again in 4 weeks

## 2017-11-14 NOTE — Assessment & Plan Note (Signed)
Patient will be fitted in custom orthotics.  Does do a lot of work when he is on his feet.  He does wear steel toe boots and has to wear this.  I feel that custom orthotics would be more beneficial for this individual.  We will support the transverse arch.  Follow-up again 4 weeks

## 2017-12-12 NOTE — Progress Notes (Signed)
[name] [address] [place], [place] [address] Phone: [phone] Subjective:    I'm seeing this patient by the request  of:    CC: Right tendon shoulder pain  [id number]  Jeffery [name] is a 57 y.o. male coming in with complaint of right shoulder pain.  Been having frozen shoulder.  Was given injection November 14, 2017.  Patient states feeling much better overall.  Not having as much severe pain.  Feels like he is making significant progress. Patient has been making some progress.  Patient feels about 90% better.      Past Medical History:  Diagnosis Date  . Anemia   . Central serous retinopathy    hx/o intraocular injection therapy for 3 years; no change after 3 years of therapy, no visual c/o  . Depression   . Former smoker   . History of blood transfusion 04/17/2017   "related to anemia"  . History of kidney stones   . Hypertension   . Left varicocele   . Port-wine stain of face   . Pre-diabetes    Past Surgical History:  Procedure Laterality Date  . COLONOSCOPY     never  . EXTRACORPOREAL SHOCK WAVE LITHOTRIPSY    . PALATE / UVULA BIOPSY / EXCISION  2006   "polyp on uvula cut out"   Social History   Socioeconomic History  . Marital status: Divorced    Spouse name: None  . Number of children: None  . Years of education: None  . Highest education level: None  Social Needs  . Financial resource strain: None  . Food insecurity - worry: None  . Food insecurity - inability: None  . Transportation needs - medical: None  . Transportation needs - non-medical: None  Occupational History  . None  Tobacco Use  . Smoking status: Former Smoker    Packs/day: 1.00    Years: 15.00    Pack years: 15.00    Types: Cigarettes    Last attempt to quit: 12/01/1997    Years since quitting: 20.0  . Smokeless tobacco: Never Used  Substance and Sexual Activity  . Alcohol use: Yes    Comment: Beer.  . Drug use: No  . Sexual  activity: Not Currently  Other Topics Concern  . None  Social History Narrative   Divorced 04/2012, 2 children ages daughter 45yo, son 73yo; exercise - moving on the job, walking, works as Electrical engineer   Allergies  Allergen Reactions  . Penicillins Other (See Comments)    Hands & feet peel. "childhood allergy"   Family History  Problem Relation Age of Onset  . Heart disease Mother        pacemaker  . Heart disease Father 6       died of MI, 34s  . Diabetes Father   . Stroke Father   . Migraines Sister   . Mental illness Sister   . Cancer Neg Hx   . Hyperlipidemia Neg Hx   . Hypertension Neg Hx   . Colon cancer Neg Hx      Past medical history, social, surgical and family history all reviewed in electronic medical record.  No pertanent information unless stated regarding to the chief complaint.   Review of Systems:Review of systems updated and as accurate as of 12/13/17  No headache, visual changes, nausea, vomiting, diarrhea, constipation, dizziness, abdominal pain, skin rash, fevers, chills, night sweats, weight loss, swollen lymph nodes, body aches, joint swelling, chest  pain, shortness of breath, mood changes.  Positive muscle aches  Objective  Blood pressure 130/80, pulse 73, height 6\' 3"  (1.905 m), weight 197 lb (89.4 kg), SpO2 96 %. Systems examined below as of 12/13/17   General: No apparent distress alert and oriented x3 mood and affect normal, dressed appropriately.  HEENT: Pupils equal, extraocular movements intact  Respiratory: Patient's speak in full sentences and does not appear short of breath  Cardiovascular: No lower extremity edema, non tender, no erythema  Skin: Warm dry intact with no signs of infection or rash on extremities or on axial skeleton.  Abdomen: Soft nontender  Neuro: Cranial nerves II through XII are intact, neurovascularly intact in all extremities with 2+ DTRs and 2+ pulses.  Lymph: No lymphadenopathy of posterior or anterior cervical  chain or axillae bilaterally.  Gait normal with good balance and coordination.  MSK:  Non tender with full range of motion and good stability and symmetric strength and tone of  elbows, wrist, hip, knee and ankles bilaterally.  Shoulder: Right Inspection reveals no abnormalities, atrophy or asymmetry. Palpation is normal with no tenderness over AC joint or bicipital groove. ROM is still moderately restricted in certain planes only though 2-5 degrees. Rotator cuff strength normal throughout. Mild positive impingement still rating Speeds and Yergason's tests normal. No labral pathology noted with negative Obrien's, negative clunk and good stability. Normal scapular function observed. No painful arc and no drop arm sign. No apprehension sign Contralateral shoulder unremarkable.      Impression and Recommendations:     This case required medical decision making of moderate complexity.      Note: This dictation was prepared with Dragon dictation along with smaller phrase technology. Any transcriptional errors that result from this process are unintentional.

## 2017-12-13 ENCOUNTER — Ambulatory Visit: Payer: BLUE CROSS/BLUE SHIELD | Admitting: Family Medicine

## 2017-12-13 ENCOUNTER — Telehealth: Payer: Self-pay | Admitting: Family Medicine

## 2017-12-13 ENCOUNTER — Encounter: Payer: Self-pay | Admitting: Family Medicine

## 2017-12-13 DIAGNOSIS — M7501 Adhesive capsulitis of right shoulder: Secondary | ICD-10-CM | POA: Diagnosis not present

## 2017-12-13 NOTE — Telephone Encounter (Signed)
Patient requesting call back.  States Dr. Tamala Julian was wanting him to take Vit B and thought a script would be sent to pharmacy.  Please follow up with patient in regard.

## 2017-12-13 NOTE — Telephone Encounter (Signed)
Sorry no  Over the counter B12 1059mcg daily  B6 200mg  daily

## 2017-12-13 NOTE — Assessment & Plan Note (Signed)
Patient seems to be doing relatively well overall.  We discussed icing regimen and home exercises.  Discussed which activity to doing which wants to avoid.  Patient is to increase activity slowly over the course the next several days.  Patient continued to do well follow-up again in 6 weeks.

## 2017-12-13 NOTE — Telephone Encounter (Signed)
lmovm for pt to return call.  

## 2017-12-13 NOTE — Patient Instructions (Signed)
Good to see you  I am so glad the shoulder is so much better already Ice is your friend.  Keep pushing the sholder With the orthotics 2 hours the first day and then increase 1-2 hours daily thereafter until you get to 8 hours.  You will do great  See me again in 6 weeks if not completely better otherwise see me when you need me

## 2017-12-14 NOTE — Telephone Encounter (Signed)
lmovm for pt to return call.  

## 2017-12-18 NOTE — Telephone Encounter (Signed)
Left msg on vmail

## 2018-01-24 ENCOUNTER — Ambulatory Visit: Payer: BLUE CROSS/BLUE SHIELD | Admitting: Family Medicine

## 2018-01-24 ENCOUNTER — Encounter: Payer: Self-pay | Admitting: Family Medicine

## 2018-01-24 DIAGNOSIS — M7501 Adhesive capsulitis of right shoulder: Secondary | ICD-10-CM | POA: Diagnosis not present

## 2018-01-24 DIAGNOSIS — M216X1 Other acquired deformities of right foot: Secondary | ICD-10-CM

## 2018-01-24 NOTE — Assessment & Plan Note (Signed)
Stable.  Much better in the custom orthotic.  Discussed icing regimen and home exercises.  Discussed with activities to do which wants to avoid.  Patient will increase activity as tolerated.  Follow-up as needed

## 2018-01-24 NOTE — Patient Instructions (Signed)
Good to see you  I am so happy you  Are doing well  Do not lace last eye on the shoes, I think this will give your toes more room Keep working on the superball  Good luck with fight club  See me when you need me

## 2018-01-24 NOTE — Assessment & Plan Note (Signed)
Now greater than 2 months out from the injection.  Continue conservative therapy.  Follow-up as needed

## 2018-01-24 NOTE — Progress Notes (Signed)
[name] [name], [place] [address] Phone: [phone] Subjective:    I'm seeing this patient by the request  of:    CC: Foot pain and shoulder pain follow-up  URK:[id number]  Jeffery [name] is a 57 y.o. male coming in with complaint of foot pain.  Had breakdown of the transverse arch.  Has been custom orthotics.  Making progress.  Having some discomfort over the fourth toe but overall has been significant improvement.  Feels the exercises is helping bring the arch back.  Not having as much pain.  Getting new shoes for work as well.   Patient is alert is completely unremarkable.  100% better.  No pain whatsoever on either shoulder.  Past Medical History:  Diagnosis Date  . Anemia   . Central serous retinopathy    hx/o intraocular injection therapy for 3 years; no change after 3 years of therapy, no visual c/o  . Depression   . Former smoker   . History of blood transfusion 04/17/2017   "related to anemia"  . History of kidney stones   . Hypertension   . Left varicocele   . Port-wine stain of face   . Pre-diabetes    Past Surgical History:  Procedure Laterality Date  . COLONOSCOPY     never  . EXTRACORPOREAL SHOCK WAVE LITHOTRIPSY    . PALATE / UVULA BIOPSY / EXCISION  2006   "polyp on uvula cut out"   Social History   Socioeconomic History  . Marital status: Divorced    Spouse name: Not on file  . Number of children: Not on file  . Years of education: Not on file  . Highest education level: Not on file  Occupational History  . Not on file  Social Needs  . Financial resource strain: Not on file  . Food insecurity:    Worry: Not on file    Inability: Not on file  . Transportation needs:    Medical: Not on file    Non-medical: Not on file  Tobacco Use  . Smoking status: Former Smoker    Packs/day: 1.00    Years: 15.00    Pack years: 15.00    Types: Cigarettes    Last attempt to quit: 12/01/1997    Years since  quitting: 20.1  . Smokeless tobacco: Never Used  Substance and Sexual Activity  . Alcohol use: Yes    Comment: Beer.  . Drug use: No  . Sexual activity: Not Currently  Lifestyle  . Physical activity:    Days per week: Not on file    Minutes per session: Not on file  . Stress: Not on file  Relationships  . Social connections:    Talks on phone: Not on file    Gets together: Not on file    Attends religious service: Not on file    Active member of club or organization: Not on file    Attends meetings of clubs or organizations: Not on file    Relationship status: Not on file  Other Topics Concern  . Not on file  Social History Narrative   Divorced 04/2012, 2 children ages daughter 86yo, son 73yo; exercise - moving on the job, walking, works as Electrical engineer   Allergies  Allergen Reactions  . Penicillins Other (See Comments)    Hands & feet peel. "childhood allergy"   Family History  Problem Relation Age of Onset  . Heart disease Mother  pacemaker  . Heart disease Father 24       died of MI, 63s  . Diabetes Father   . Stroke Father   . Migraines Sister   . Mental illness Sister   . Cancer Neg Hx   . Hyperlipidemia Neg Hx   . Hypertension Neg Hx   . Colon cancer Neg Hx      Past medical history, social, surgical and family history all reviewed in electronic medical record.  No pertanent information unless stated regarding to the chief complaint.   Review of Systems:Review of systems updated and as accurate as of 01/24/18  No headache, visual changes, nausea, vomiting, diarrhea, constipation, dizziness, abdominal pain, skin rash, fevers, chills, night sweats, weight loss, swollen lymph nodes, body aches, joint swelling, muscle aches, chest pain, shortness of breath, mood changes.   Objective  Blood pressure 120/70, pulse 84, height 6\' 3"  (1.905 m), weight 193 lb (87.5 kg), SpO2 98 %. Systems examined below as of 01/24/18   General: No apparent distress alert  and oriented x3 mood and affect normal, dressed appropriately.  HEENT: Pupils equal, extraocular movements intact  Respiratory: Patient's speak in full sentences and does not appear short of breath  Cardiovascular: No lower extremity edema, non tender, no erythema  Skin: Warm dry intact with no signs of infection or rash on extremities or on axial skeleton.  Abdomen: Soft nontender  Neuro: Cranial nerves II through XII are intact, neurovascularly intact in all extremities with 2+ DTRs and 2+ pulses.  Lymph: No lymphadenopathy of posterior or anterior cervical chain or axillae bilaterally.  Gait normal with good balance and coordination.  MSK:  Non tender with full range of motion and good stability and symmetric strength and tone of  elbows, wrist, hip, knee and ankles bilaterally.  Shoulder: Right Inspection reveals no abnormalities, atrophy or asymmetry. Palpation is normal with no tenderness over AC joint or bicipital groove. ROM is full in all planes. Rotator cuff strength normal throughout. No signs of impingement with negative Neer and Hawkin's tests, empty can sign. Speeds and Yergason's tests normal. No labral pathology noted with negative Obrien's, negative clunk and good stability. Normal scapular function observed. No painful arc and no drop arm sign. No apprehension sign  Contralateral shoulder unremarkable  Foot examination breakdown of the transverse arch bilaterally right greater than left.  Patient does have some pes planus as well with overpronation of the hindfoot.  Minimal pain to no pain at all.  Negative squeeze test.  Near full range of motion of the ankle.   Impression and Recommendations:     This case required medical decision making of moderate complexity.      Note: This dictation was prepared with Dragon dictation along with smaller phrase technology. Any transcriptional errors that result from this process are unintentional.

## 2018-01-30 ENCOUNTER — Other Ambulatory Visit: Payer: Self-pay | Admitting: Family Medicine

## 2018-01-30 NOTE — Telephone Encounter (Signed)
Refill done.  

## 2018-05-15 ENCOUNTER — Other Ambulatory Visit: Payer: Self-pay | Admitting: Family Medicine

## 2018-05-15 ENCOUNTER — Telehealth: Payer: Self-pay | Admitting: Family Medicine

## 2018-05-15 ENCOUNTER — Ambulatory Visit: Payer: BLUE CROSS/BLUE SHIELD | Admitting: Medical

## 2018-05-15 DIAGNOSIS — I10 Essential (primary) hypertension: Secondary | ICD-10-CM

## 2018-05-15 NOTE — Telephone Encounter (Signed)
  Patient called and scheduled cpe for 08/21/18  Needs bp med refill

## 2018-08-21 ENCOUNTER — Encounter: Payer: Self-pay | Admitting: Family Medicine

## 2018-08-21 ENCOUNTER — Ambulatory Visit (INDEPENDENT_AMBULATORY_CARE_PROVIDER_SITE_OTHER): Payer: BLUE CROSS/BLUE SHIELD | Admitting: Family Medicine

## 2018-08-21 VITALS — BP 126/82 | HR 79 | Temp 99.1°F | Ht 74.0 in | Wt 195.0 lb

## 2018-08-21 DIAGNOSIS — Z0000 Encounter for general adult medical examination without abnormal findings: Secondary | ICD-10-CM

## 2018-08-21 DIAGNOSIS — Z1211 Encounter for screening for malignant neoplasm of colon: Secondary | ICD-10-CM

## 2018-08-21 DIAGNOSIS — I10 Essential (primary) hypertension: Secondary | ICD-10-CM | POA: Diagnosis not present

## 2018-08-21 DIAGNOSIS — F325 Major depressive disorder, single episode, in full remission: Secondary | ICD-10-CM

## 2018-08-21 LAB — CBC WITH DIFFERENTIAL/PLATELET
BASOS ABS: 0 10*3/uL (ref 0.0–0.2)
Basos: 1 %
EOS (ABSOLUTE): 0.4 10*3/uL (ref 0.0–0.4)
Eos: 7 %
Hematocrit: 41.4 % (ref 37.5–51.0)
Hemoglobin: 14 g/dL (ref 13.0–17.7)
Immature Grans (Abs): 0 10*3/uL (ref 0.0–0.1)
Immature Granulocytes: 0 %
LYMPHS ABS: 1.2 10*3/uL (ref 0.7–3.1)
LYMPHS: 25 %
MCH: 30.4 pg (ref 26.6–33.0)
MCHC: 33.8 g/dL (ref 31.5–35.7)
MCV: 90 fL (ref 79–97)
MONOS ABS: 0.6 10*3/uL (ref 0.1–0.9)
Monocytes: 13 %
NEUTROS ABS: 2.7 10*3/uL (ref 1.4–7.0)
Neutrophils: 54 %
PLATELETS: 275 10*3/uL (ref 150–450)
RBC: 4.61 x10E6/uL (ref 4.14–5.80)
RDW: 11.8 % — AB (ref 12.3–15.4)
WBC: 4.9 10*3/uL (ref 3.4–10.8)

## 2018-08-21 LAB — LIPID PANEL
CHOLESTEROL TOTAL: 185 mg/dL (ref 100–199)
Chol/HDL Ratio: 2.6 ratio (ref 0.0–5.0)
HDL: 71 mg/dL (ref >39)
LDL Calculated: 94 mg/dL (ref 0–99)
Triglycerides: 101 mg/dL (ref 0–149)
VLDL CHOLESTEROL CAL: 20 mg/dL (ref 5–40)

## 2018-08-21 LAB — COMPREHENSIVE METABOLIC PANEL
ALK PHOS: 74 IU/L (ref 39–117)
ALT: 20 IU/L (ref 0–44)
AST: 17 IU/L (ref 0–40)
Albumin/Globulin Ratio: 2 (ref 1.2–2.2)
Albumin: 4.7 g/dL (ref 3.5–5.5)
BUN/Creatinine Ratio: 13 (ref 9–20)
BUN: 13 mg/dL (ref 6–24)
Bilirubin Total: 0.4 mg/dL (ref 0.0–1.2)
CHLORIDE: 102 mmol/L (ref 96–106)
CO2: 21 mmol/L (ref 20–29)
Calcium: 9.6 mg/dL (ref 8.7–10.2)
Creatinine, Ser: 0.99 mg/dL (ref 0.76–1.27)
GFR calc Af Amer: 99 mL/min/{1.73_m2} (ref >59)
GFR calc non Af Amer: 85 mL/min/{1.73_m2} (ref >59)
GLUCOSE: 99 mg/dL (ref 65–99)
Globulin, Total: 2.3 g/dL (ref 1.5–4.5)
POTASSIUM: 4 mmol/L (ref 3.5–5.2)
Sodium: 140 mmol/L (ref 134–144)
Total Protein: 7 g/dL (ref 6.0–8.5)

## 2018-08-21 LAB — POCT URINALYSIS DIP (PROADVANTAGE DEVICE)
BILIRUBIN UA: NEGATIVE
BILIRUBIN UA: NEGATIVE mg/dL
Blood, UA: NEGATIVE
GLUCOSE UA: NEGATIVE mg/dL
LEUKOCYTES UA: NEGATIVE
Nitrite, UA: NEGATIVE
PH UA: 6 (ref 5.0–8.0)
Protein Ur, POC: NEGATIVE mg/dL
Specific Gravity, Urine: 1.01
Urobilinogen, Ur: 3.5

## 2018-08-21 MED ORDER — LISINOPRIL-HYDROCHLOROTHIAZIDE 20-12.5 MG PO TABS: 1.0000 | ORAL_TABLET | Freq: Every day | ORAL | 3 refills | Status: DC

## 2018-08-21 MED ORDER — AMLODIPINE BESYLATE 5 MG PO TABS: 5.0000 mg | ORAL_TABLET | Freq: Every day | ORAL | 3 refills | Status: DC

## 2018-08-21 NOTE — Progress Notes (Signed)
Established Patient Office Visit  Subjective:  Patient ID: Jeffery [name], male    DOB: 05-19-1961  Age: 57 y.o. MRN: [id number]  CC:  Chief Complaint  Patient presents with  . other    fasting CPE    HPI DAKING [place]T presents for a complete examination.  He does complain of left thumb pain.  He also states that working does cause various arthritic types of things but they usually go away very quickly.  He does have a previous history of anemia that was due to a toxin that was and marijuana that he was using at that time.  Since then he has not smoked anything of any kind.  His blood work has returned to normal.  He continues on amlodipine as well as lisinopril/HCTZ and is having no difficulty with that.  Does have a previous history of depression but at this time is doing quite nicely.  He has had various orthopedic issues including adhesive capsulitis but this is been taking care of quite adequately.  Otherwise he has no particular concerns or complaints.  Family and social history as well as health maintenance and immunizations was reviewed  Past Medical History:  Diagnosis Date  . Anemia   . Central serous retinopathy    hx/o intraocular injection therapy for 3 years; no change after 3 years of therapy, no visual c/o  . Depression   . Former smoker   . History of blood transfusion 04/17/2017   "related to anemia"  . History of kidney stones   . Hypertension   . Left varicocele   . Port-wine stain of face   . Pre-diabetes     Past Surgical History:  Procedure Laterality Date  . COLONOSCOPY     never  . EXTRACORPOREAL SHOCK WAVE LITHOTRIPSY    . PALATE / UVULA BIOPSY / EXCISION  2006   "polyp on uvula cut out"    Family History  Problem Relation Age of Onset  . Heart disease Mother        pacemaker  . Heart disease Father 44       died of MI, 68s  . Diabetes Father   . Stroke Father   . Migraines Sister   . Mental illness Sister   . Cancer Neg Hx   .  Hyperlipidemia Neg Hx   . Hypertension Neg Hx   . Colon cancer Neg Hx     Social History   Socioeconomic History  . Marital status: Divorced    Spouse name: Not on file  . Number of children: Not on file  . Years of education: Not on file  . Highest education level: Not on file  Occupational History  . Not on file  Social Needs  . Financial resource strain: Not on file  . Food insecurity:    Worry: Not on file    Inability: Not on file  . Transportation needs:    Medical: Not on file    Non-medical: Not on file  Tobacco Use  . Smoking status: Former Smoker    Packs/day: 1.00    Years: 15.00    Pack years: 15.00    Types: Cigarettes    Last attempt to quit: 12/01/1997    Years since quitting: 20.7  . Smokeless tobacco: Never Used  Substance and Sexual Activity  . Alcohol use: Yes    Comment: Beer.  . Drug use: No  . Sexual activity: Not Currently  Lifestyle  . Physical activity:  Days per week: Not on file    Minutes per session: Not on file  . Stress: Not on file  Relationships  . Social connections:    Talks on phone: Not on file    Gets together: Not on file    Attends religious service: Not on file    Active member of club or organization: Not on file    Attends meetings of clubs or organizations: Not on file    Relationship status: Not on file  . Intimate partner violence:    Fear of current or ex partner: Not on file    Emotionally abused: Not on file    Physically abused: Not on file    Forced sexual activity: Not on file  Other Topics Concern  . Not on file  Social History Narrative   Divorced 04/2012, 2 children ages daughter 55yo, son 50yo; exercise - moving on the job, walking, works as Electrical engineer    Outpatient Medications Prior to Visit  Medication Sig Dispense Refill  . Cyanocobalamin (VITAMIN B 12 PO) Take 1 tablet by mouth.    . Potassium 99 MG TABS Take 2 tablets by mouth.    [name] amLODipine (NORVASC) 5 MG tablet TAKE 1 TABLET (5 MG  TOTAL) BY MOUTH DAILY. 90 tablet 3  . lisinopril-hydrochlorothiazide (PRINZIDE,ZESTORETIC) 20-12.5 MG tablet TAKE 1 TABLET BY MOUTH DAILY. 90 tablet 3  . B Complex-C (B-COMPLEX WITH VITAMIN C) tablet Take 1 tablet by mouth daily. (Patient not taking: Reported on 01/24/2018) 60 tablet 0  . ferrous sulfate 325 (65 FE) MG tablet Take 1 tablet (325 mg total) by mouth 2 (two) times daily with a meal. (Patient not taking: Reported on 01/24/2018) 956 tablet 0  . folic acid (FOLVITE) 1 MG tablet Take 1 tablet (1 mg total) by mouth daily. (Patient not taking: Reported on 01/24/2018) 30 tablet 0  . phytonadione (VITAMIN K) 5 MG tablet Take 2 tablets (10 mg total) by mouth daily. (Patient not taking: Reported on 01/24/2018) 30 tablet 0  . polyethylene glycol (MIRALAX / GLYCOLAX) packet Take 17 g by mouth daily as needed. (Patient not taking: Reported on 01/24/2018) 14 each 0  . thiamine 100 MG tablet Take 1 tablet (100 mg total) by mouth daily. (Patient not taking: Reported on 01/24/2018) 30 tablet 0  . Vitamin D, Ergocalciferol, (DRISDOL) 50000 units CAPS capsule TAKE 1 CAPSULE (50,000 UNITS TOTAL) BY MOUTH EVERY 7 (SEVEN) DAYS. (Patient not taking: Reported on 08/21/2018) 12 capsule 0   No facility-administered medications prior to visit.     Allergies  Allergen Reactions  . Penicillins Other (See Comments)    Hands & feet peel. "childhood allergy"    ROS Review of Systems    Objective:    Physical Exam  BP 126/82 (BP Location: Left Arm, Patient Position: Sitting)   Pulse 79   Temp 99.1 F (37.3 C)   Ht 6\' 2"  (1.88 m)   Wt 195 lb (88.5 kg)   SpO2 98%   BMI 25.04 kg/m  Wt Readings from Last 3 Encounters:  08/21/18 195 lb (88.5 kg)  01/24/18 193 lb (87.5 kg)  12/13/17 197 lb (89.4 kg)  BP 126/82 (BP Location: Left Arm, Patient Position: Sitting)   Pulse 79   Temp 99.1 F (37.3 C)   Ht 6\' 2"  (1.88 m)   Wt 195 lb (88.5 kg)   SpO2 98%   BMI 25.04 kg/m   General Appearance:    Alert,  cooperative, no distress, appears  stated age  Head:    Normocephalic, without obvious abnormality, atraumatic  Eyes:    PERRL, conjunctiva/corneas clear, EOM's intact, fundi    benign  Ears:    Normal TM's and external ear canals  Nose:   Nares normal, mucosa normal, no drainage or sinus   tenderness  Throat:   Lips, mucosa, and tongue normal; teeth and gums normal  Neck:   Supple, no lymphadenopathy;  thyroid:  no   enlargement/tenderness/nodules; no carotid   bruit or JVD  Back:    Spine nontender, no curvature, ROM normal, no CVA     tenderness  Lungs:     Clear to auscultation bilaterally without wheezes, rales or     ronchi; respirations unlabored  Chest Wall:    No tenderness or deformity   Heart:    Regular rate and rhythm, S1 and S2 normal, no murmur, rub   or gallop  Breast Exam:    No chest wall tenderness, masses or gynecomastia  Abdomen:     Soft, non-tender, nondistended, normoactive bowel sounds,    no masses, no hepatosplenomegaly  Genitalia:   Deferred  Rectal:   Deferred  Extremities:   No clubbing, cyanosis or edema exam of the left thumb does show some bony exostosis near the MCP joint.  Pulses:   2+ and symmetric all extremities  Skin:   Skin color, texture, turgor normal, no rashes or lesions  Lymph nodes:   Cervical, supraclavicular, and axillary nodes normal  Neurologic:   CNII-XII intact, normal strength, sensation and gait; reflexes 2+ and symmetric throughout          Psych:   Normal mood, affect, hygiene and grooming.      Health Maintenance Due  Topic Date Due  . COLONOSCOPY  01/17/2013    There are no preventive care reminders to display for this patient.  Lab Results  Component Value Date   TSH 1.663 04/17/2017   Lab Results  Component Value Date   WBC 5.4 06/26/2017   HGB 13.6 06/26/2017   HCT 40.6 06/26/2017   MCV 90.4 06/26/2017   PLT 234 06/26/2017   Lab Results  Component Value Date   NA 138 04/20/2017   K 3.4 (L) 04/20/2017   CO2  28 04/20/2017   GLUCOSE 103 (H) 04/20/2017   BUN 11 04/20/2017   CREATININE 0.91 04/20/2017   BILITOT 1.7 (H) 04/20/2017   ALKPHOS 54 04/20/2017   AST 13 (L) 04/20/2017   ALT 11 (L) 04/20/2017   PROT 5.5 (L) 04/20/2017   ALBUMIN 3.1 (L) 04/20/2017   CALCIUM 9.1 04/20/2017   ANIONGAP 5 04/20/2017   Lab Results  Component Value Date   CHOL 199 11/10/2015   Lab Results  Component Value Date   HDL 75 11/10/2015   Lab Results  Component Value Date   LDLCALC 115 11/10/2015   Lab Results  Component Value Date   TRIG 47 11/10/2015   Lab Results  Component Value Date   CHOLHDL 2.7 11/10/2015   Lab Results  Component Value Date   HGBA1C 5.8 (H) 01/14/2013      Assessment & Plan:   Problem List Items Addressed This Visit    Essential hypertension, benign   Relevant Medications   amLODipine (NORVASC) 5 MG tablet   lisinopril-hydrochlorothiazide (PRINZIDE,ZESTORETIC) 20-12.5 MG tablet   Other Relevant Orders   CBC with Differential/Platelet   Comprehensive metabolic panel    Other Visit Diagnoses    Routine general medical examination at a  health care facility    -  Primary   Relevant Orders   CBC with Differential/Platelet   Comprehensive metabolic panel   Lipid panel   POCT Urinalysis DIP (Proadvantage Device) (Completed)   Screening for colon cancer       Relevant Orders   Cologuard      Meds ordered this encounter  Medications  . amLODipine (NORVASC) 5 MG tablet    Sig: Take 1 tablet (5 mg total) by mouth daily.    Dispense:  90 tablet    Refill:  3  . lisinopril-hydrochlorothiazide (PRINZIDE,ZESTORETIC) 20-12.5 MG tablet    Sig: Take 1 tablet by mouth daily.    Dispense:  90 tablet    Refill:  3  Discussed the lesion in his hand and recommended if it starts to give him more trouble, I can refer to orthopedics.  Otherwise he will continue on his present medication regimen. Follow-up: No follow-ups on file.    Jill Alexanders, MD

## 2018-08-22 ENCOUNTER — Encounter: Payer: Self-pay | Admitting: Family Medicine

## 2018-09-02 LAB — COLOGUARD: Cologuard: NEGATIVE

## 2018-10-02 ENCOUNTER — Encounter: Payer: Self-pay | Admitting: Medical

## 2018-10-02 ENCOUNTER — Ambulatory Visit (INDEPENDENT_AMBULATORY_CARE_PROVIDER_SITE_OTHER): Payer: BLUE CROSS/BLUE SHIELD | Admitting: Medical

## 2018-10-02 VITALS — BP 136/88 | HR 68 | Temp 97.7°F | Wt 202.4 lb

## 2018-10-02 DIAGNOSIS — L290 Pruritus ani: Secondary | ICD-10-CM

## 2018-10-02 DIAGNOSIS — K644 Residual hemorrhoidal skin tags: Secondary | ICD-10-CM | POA: Diagnosis not present

## 2018-10-02 MED ORDER — HYDROCORTISONE 2.5 % RE CREA: 1.0000 "application " | TOPICAL_CREAM | Freq: Two times a day (BID) | RECTAL | 1 refills | Status: DC

## 2018-10-02 MED ORDER — MEBENDAZOLE 100 MG PO CHEW: CHEWABLE_TABLET | ORAL | 0 refills | Status: DC

## 2018-10-02 NOTE — Progress Notes (Signed)
Subjective:  Jeffery [name] is a 57 y.o. male who presents for Chief Complaint  Patient presents with  . other     anal itching for little longer than 2 month     Here for concerns about anal itching.  Has had this on and off for months.  Denies hx/o hemorrhoids.   He notes that he is clean, has daily BM in the morning.  Showers after the bowel movement.  Denies blood in stool, no diarrhea, no constipation.  No fever.   He is concerned about pinworms after looking up his symptoms online.  He denies eating undercooked meat, contaminated food or going barefooted outside.   No other aggravating or relieving factors.    No other c/o.  The following portions of the patient's history were reviewed and updated as appropriate: allergies, current medications, past family history, past medical history, past social history, past surgical history and problem list.  Past Medical History:  Diagnosis Date  . Anemia   . Central serous retinopathy    hx/o intraocular injection therapy for 3 years; no change after 3 years of therapy, no visual c/o  . Depression   . Former smoker   . History of blood transfusion 04/17/2017   "related to anemia"  . History of kidney stones   . Hypertension   . Left varicocele   . Port-wine stain of face   . Pre-diabetes     ROS Otherwise as in subjective above  Objective: BP 136/88 (BP Location: Left Arm, Patient Position: Sitting)   Pulse 68   Temp 97.7 F (36.5 C)   Wt 202 lb 6.4 oz (91.8 kg)   SpO2 97%   BMI 25.99 kg/m   General appearance: alert, no distress, well developed, well nourished Abdomen: +bs, soft, non tender, non distended, no masses, no hepatomegaly, no splenomegaly Anus: There are several small slightly raised papular 1 to 2 mm lesions perianal that are whitish-yellow that may be tiny cyst versus skin tag.  Otherwise anus normal.  No obvious other mass or rash   Assessment: Encounter Diagnoses  Name Primary?  Jeffery [name] Anal itching Yes   . Anal skin tag      Plan: We discussed possible causes of anal itching.  I suspect internal hemorrhoids or irritation from hygiene products.  He is very concerned about pinworm infection.  Begin 1 round of mebendazole tablet, begin cream below for the next week. Advised to switch to [facility] sensitive skin soap for hygiene, switch to a basic toilet paper without fragrances or other additives.  Advised to use baby wipes or Tucks wipes for the next week and see if the symptoms improved.  Follow-up with Dr. Redmond School his PCP here.  Jeffery [name] was seen today for other.  Diagnoses and all orders for this visit:  Anal itching  Anal skin tag  Other orders -     mebendazole (VERMOX) 100 MG chewable tablet; 1 tablet now, repeat in 3 weeks -     hydrocortisone (ANUSOL-HC) 2.5 % rectal cream; Place 1 application rectally 2 (two) times daily.   Follow up: 2 weeks

## 2018-10-11 ENCOUNTER — Ambulatory Visit (INDEPENDENT_AMBULATORY_CARE_PROVIDER_SITE_OTHER): Payer: BLUE CROSS/BLUE SHIELD | Admitting: Family Medicine

## 2018-10-11 ENCOUNTER — Encounter: Payer: Self-pay | Admitting: Family Medicine

## 2018-10-11 VITALS — BP 124/80 | HR 69 | Temp 98.4°F | Wt 199.8 lb

## 2018-10-11 DIAGNOSIS — L290 Pruritus ani: Secondary | ICD-10-CM | POA: Diagnosis not present

## 2018-10-11 LAB — HEMOCCULT GUIAC POC 1CARD (OFFICE): FECAL OCCULT BLD: NEGATIVE

## 2018-10-11 LAB — CBC WITH DIFFERENTIAL/PLATELET
BASOS ABS: 0 10*3/uL (ref 0.0–0.2)
Basos: 1 %
EOS (ABSOLUTE): 0.4 10*3/uL (ref 0.0–0.4)
Eos: 8 %
HEMOGLOBIN: 15 g/dL (ref 13.0–17.7)
Hematocrit: 42.1 % (ref 37.5–51.0)
Immature Grans (Abs): 0 10*3/uL (ref 0.0–0.1)
Immature Granulocytes: 0 %
LYMPHS ABS: 1.3 10*3/uL (ref 0.7–3.1)
Lymphs: 27 %
MCH: 32.3 pg (ref 26.6–33.0)
MCHC: 35.6 g/dL (ref 31.5–35.7)
MCV: 91 fL (ref 79–97)
MONOCYTES: 12 %
Monocytes Absolute: 0.6 10*3/uL (ref 0.1–0.9)
NEUTROS ABS: 2.5 10*3/uL (ref 1.4–7.0)
Neutrophils: 52 %
Platelets: 277 10*3/uL (ref 150–450)
RBC: 4.65 x10E6/uL (ref 4.14–5.80)
RDW: 11.8 % (ref 11.6–15.4)
WBC: 4.8 10*3/uL (ref 3.4–10.8)

## 2018-10-11 NOTE — Progress Notes (Signed)
   Subjective:    Patient ID: Jeffery [name], male    DOB: 02-18-1961, 58 y.o.   MRN: [id number]  HPI He is here for recheck on continued difficulty with anal itching.  On his last visit he was given Vermox and did use it.  He still complains of itching and a feeling of something moving in the anal area especially at night.  At one point he thinks he did see a tapeworm but did not hold onto it.   Review of Systems     Objective:   Physical Exam Anal exam shows no lesions.  Rectal exam showed no palpable lesions and has guaiac negative.       Assessment & Plan:  Anal itching - Plan: CBC with Differential/Platelet, POCT occult blood stool I will check a CBC this evening eosinophil level.  If he continues have difficulty next up would be to do stool for ova and parasites and then possibly refer

## 2018-10-12 ENCOUNTER — Telehealth: Payer: Self-pay

## 2018-10-12 NOTE — Telephone Encounter (Signed)
Pt saw white round things in stool. Pt will bring them by today. Please advise . [place]

## 2019-04-12 ENCOUNTER — Ambulatory Visit: Payer: Self-pay

## 2019-04-12 ENCOUNTER — Other Ambulatory Visit: Payer: Self-pay

## 2019-04-12 ENCOUNTER — Other Ambulatory Visit: Payer: Self-pay | Admitting: Family Medicine

## 2019-04-12 DIAGNOSIS — M25562 Pain in left knee: Secondary | ICD-10-CM

## 2019-04-12 IMAGING — DX LEFT KNEE - COMPLETE 4+ VIEW
4 series · 4 of 4 positions shown · non-contrast
Comparison: Radiographs [DATE].

CLINICAL DATA: Acute left knee pain after work.

EXAM:
LEFT KNEE - COMPLETE 4+ VIEW

[knee pa]
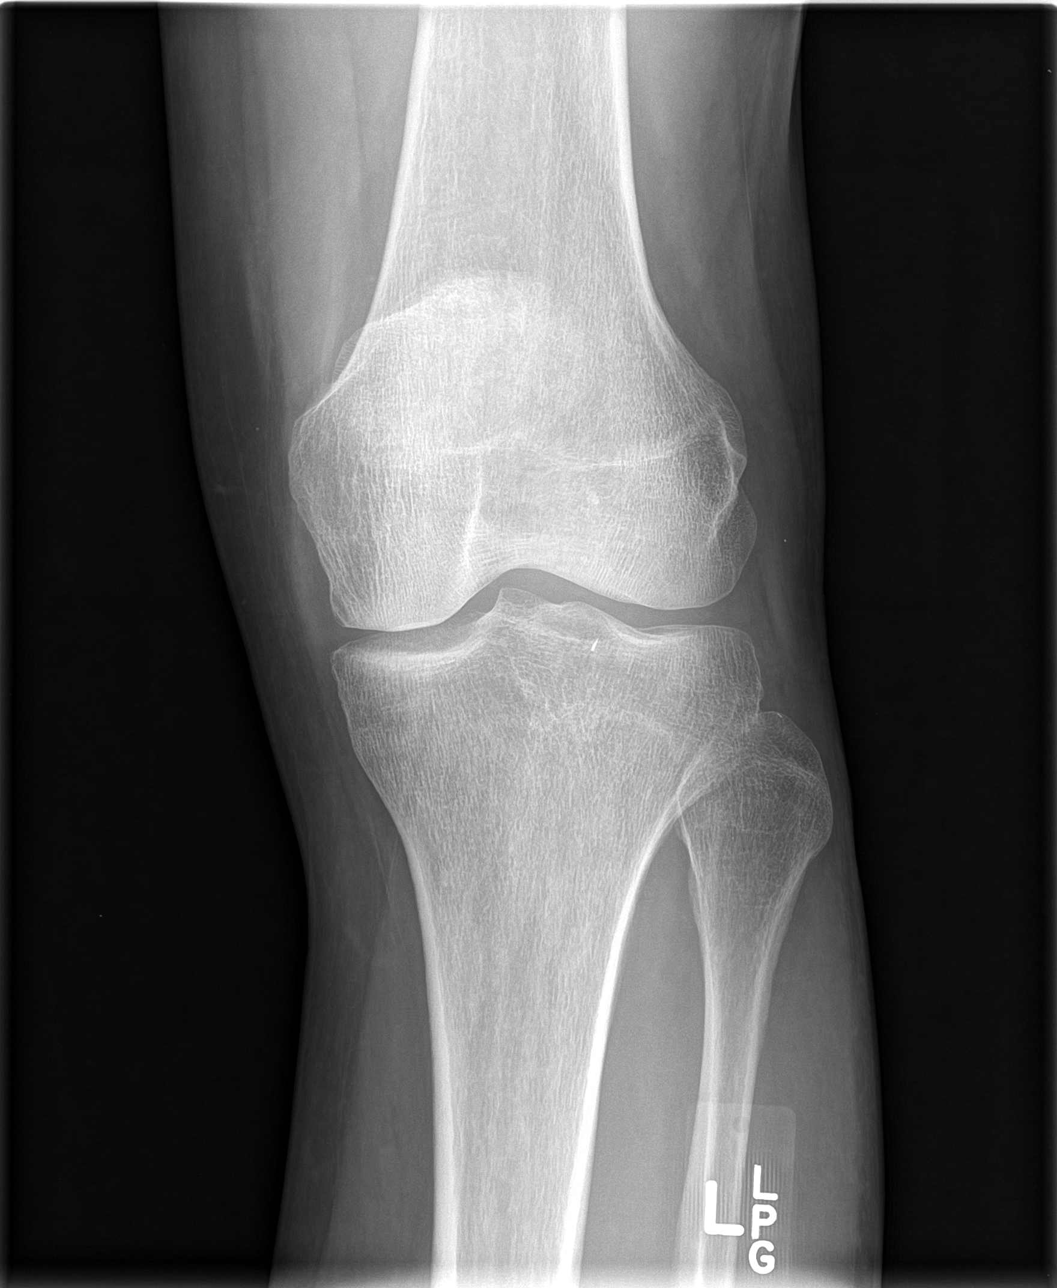

[knee obl (1 of 2)]
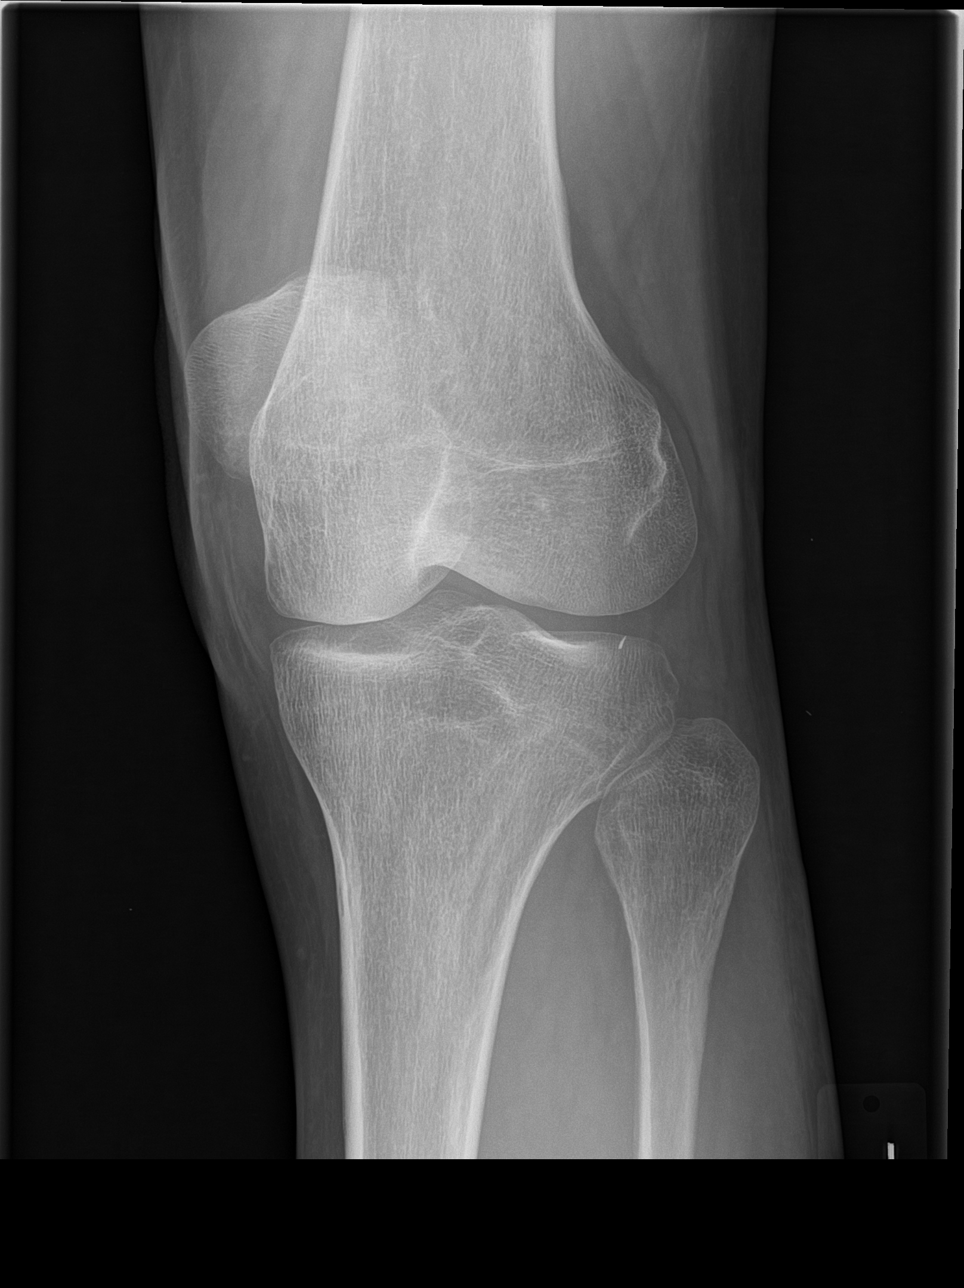

[knee obl (2 of 2)]
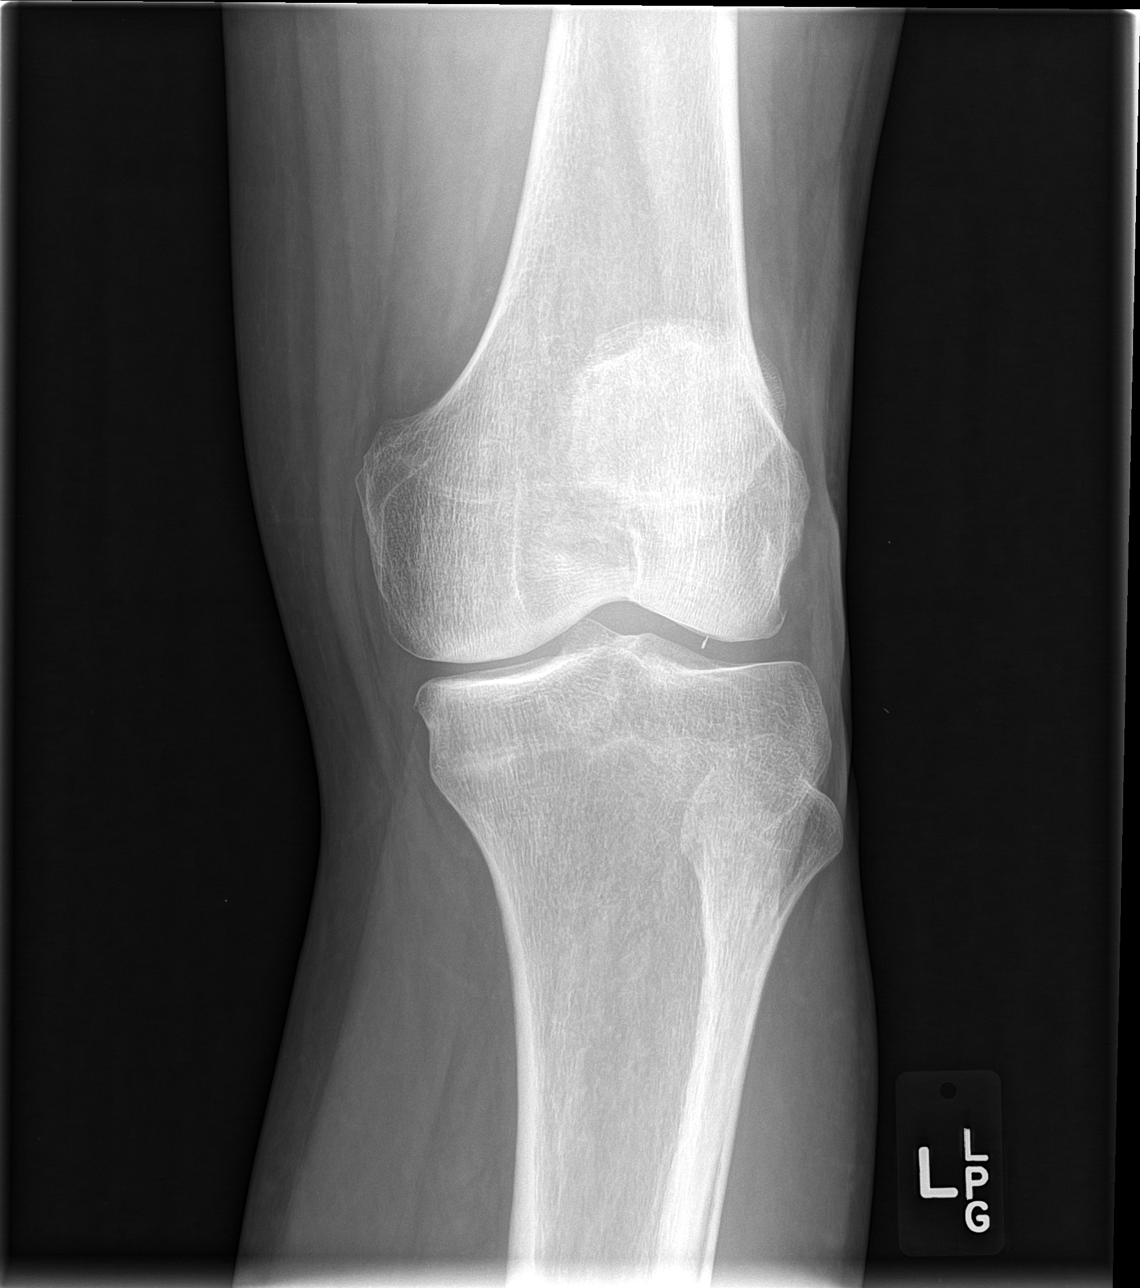

[knee lat]
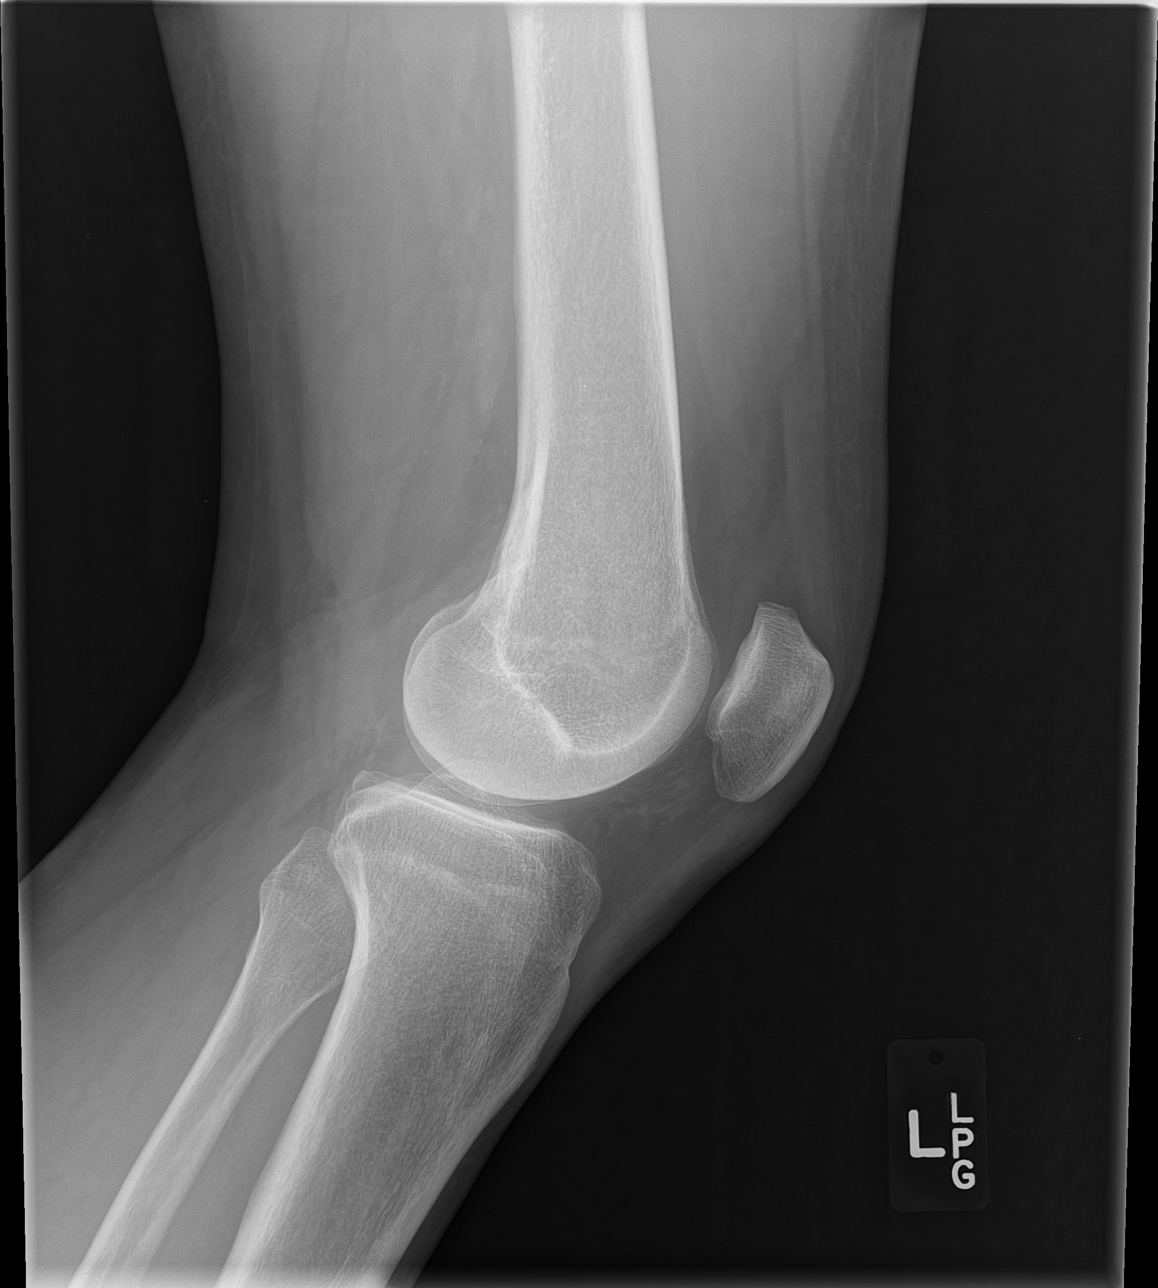

[4 of 4 positions shown; findings below may reference images not displayed]

FINDINGS: No evidence of fracture, dislocation, or joint effusion. Mild
narrowing of medial joint space is noted. Soft tissues are
unremarkable.
IMPRESSION: Mild degenerative joint disease is noted medially. No acute
abnormality seen involving the left knee.

## 2019-04-14 NOTE — Progress Notes (Signed)
[name] [address] [place], [place] [address] Phone: [phone] Subjective:   Jeffery [name], am serving as a scribe for Dr. Hulan Saas.   CC: multiple joint pains.   VFI:[id number]  Jeffery [name] is a 58 y.o. male coming in with complaint of left thumb pain and right elbow pain. Patient had a work related injury to the left knee and is under the care of another provider.   Pain for over one year in thumb. Pain is intermittent. Is not doing anything for pain of thumb.   Right elbow pain is chronic. Intermittent pain. Patient uses his arms a lot at work. Patient has not tried bracing.    Patient notes that his feet bother him. Notes a curved toenail that is cutting into his foot and might be contributing to his knee pain.  Mostly on the left side of the knee.  Patient since then it hurts more on the medial aspect of the knee.  Rates the severity of pain is 8 out of 10.      Past Medical History:  Diagnosis Date  . Anemia   . Central serous retinopathy    hx/o intraocular injection therapy for 3 years; [name] change after 3 years of therapy, [name] visual c/o  . Depression   . Former smoker   . History of blood transfusion 04/17/2017   "related to anemia"  . History of kidney stones   . Hypertension   . Left varicocele   . Port-wine stain of face   . Pre-diabetes    Past Surgical History:  Procedure Laterality Date  . COLONOSCOPY     never  . EXTRACORPOREAL SHOCK WAVE LITHOTRIPSY    . PALATE / UVULA BIOPSY / EXCISION  2006   "polyp on uvula cut out"   Social History   Socioeconomic History  . Marital status: Divorced    Spouse name: Not on file  . Number of children: Not on file  . Years of education: Not on file  . Highest education level: Not on file  Occupational History  . Not on file  Social Needs  . Financial resource strain: Not on file  . Food insecurity    Worry: Not on file    Inability: Not on file  .  Transportation needs    Medical: Not on file    Non-medical: Not on file  Tobacco Use  . Smoking status: Former Smoker    Packs/day: 1.00    Years: 15.00    Pack years: 15.00    Types: Cigarettes    Quit date: 12/01/1997    Years since quitting: 21.3  . Smokeless tobacco: Never Used  Substance and Sexual Activity  . Alcohol use: Yes    Comment: Beer.  . Drug use: [name]  . Sexual activity: Not Currently  Lifestyle  . Physical activity    Days per week: Not on file    Minutes per session: Not on file  . Stress: Not on file  Relationships  . Social Herbalist on phone: Not on file    Gets together: Not on file    Attends religious service: Not on file    Active member of club or organization: Not on file    Attends meetings of clubs or organizations: Not on file    Relationship status: Not on file  Other Topics Concern  . Not on file  Social History Narrative   Divorced 04/2012,  2 children ages daughter 83yo, son 66yo; exercise - moving on the job, walking, works as Electrical engineer   Allergies  Allergen Reactions  . Penicillins Other (See Comments)    Hands & feet peel. "childhood allergy"   Family History  Problem Relation Age of Onset  . Heart disease Mother        pacemaker  . Heart disease Father 87       died of MI, 64s  . Diabetes Father   . Stroke Father   . Migraines Sister   . Mental illness Sister   . Cancer Neg Hx   . Hyperlipidemia Neg Hx   . Hypertension Neg Hx   . Colon cancer Neg Hx      Current Outpatient Medications (Cardiovascular):  .  amLODipine (NORVASC) 5 MG tablet, Take 1 tablet (5 mg total) by mouth daily. [name]  lisinopril-hydrochlorothiazide (PRINZIDE,ZESTORETIC) 20-12.5 MG tablet, Take 1 tablet by mouth daily.    Current Outpatient Medications (Hematological):  [name]  Cyanocobalamin (VITAMIN B 12 PO), Take 1 tablet by mouth. .  ferrous sulfate 325 (65 FE) MG tablet, Take 1 tablet (325 mg total) by mouth 2 (two) times daily with a  meal. (Patient not taking: Reported on 01/24/2018) .  folic acid (FOLVITE) 1 MG tablet, Take 1 tablet (1 mg total) by mouth daily. (Patient not taking: Reported on 01/24/2018)  Current Outpatient Medications (Other):  [name]  B Complex-C (B-COMPLEX WITH VITAMIN C) tablet, Take 1 tablet by mouth daily. (Patient not taking: Reported on 01/24/2018) .  hydrocortisone (ANUSOL-HC) 2.5 % rectal cream, Place 1 application rectally 2 (two) times daily. .  mebendazole (VERMOX) 100 MG chewable tablet, 1 tablet now, repeat in 3 weeks .  phytonadione (VITAMIN K) 5 MG tablet, Take 2 tablets (10 mg total) by mouth daily. (Patient not taking: Reported on 01/24/2018) .  polyethylene glycol (MIRALAX / GLYCOLAX) packet, Take 17 g by mouth daily as needed. (Patient not taking: Reported on 01/24/2018) .  Potassium 99 MG TABS, Take 2 tablets by mouth. .  thiamine 100 MG tablet, Take 1 tablet (100 mg total) by mouth daily. (Patient not taking: Reported on 01/24/2018) .  Vitamin D, Ergocalciferol, (DRISDOL) 50000 units CAPS capsule, TAKE 1 CAPSULE (50,000 UNITS TOTAL) BY MOUTH EVERY 7 (SEVEN) DAYS. (Patient not taking: Reported on 08/21/2018)    Past medical history, social, surgical and family history all reviewed in electronic medical record.  [name] pertanent information unless stated regarding to the chief complaint.   Review of Systems:  [name] headache, visual changes, nausea, vomiting, diarrhea, constipation, dizziness, abdominal pain, skin rash, fevers, chills, night sweats, weight loss, swollen lymph nodes, body aches, joint swelling,  chest pain, shortness of breath, mood changes.  Positive muscle aches  Objective  Blood pressure 124/72, pulse 81, height 6\' 2"  (1.88 m), weight 199 lb (90.3 kg), SpO2 98 %.    General: [name] apparent distress alert and oriented x3 mood and affect normal, dressed appropriately.  HEENT: Pupils equal, extraocular movements intact  Respiratory: Patient's speak in full sentences and does not appear  short of breath  Cardiovascular: [name] lower extremity edema, non tender, [name] erythema  Skin: Warm dry intact with [name] signs of infection or rash on extremities or on axial skeleton.  Abdomen: Soft nontender  Neuro: Cranial nerves II through XII are intact, neurovascularly intact in all extremities with 2+ DTRs and 2+ pulses.  Lymph: [name] lymphadenopathy of posterior or anterior cervical chain or axillae bilaterally.  Gait antalgic MSK:  Mild arthritic changes Left knee exam shows the patient is tender to palpation over the medial joint space.  Positive McMurray's.  Near full range of motion.  Limited musculoskeletal ultrasound was performed and interpreted by Jeffery [name]  Limited ultrasound shows the patient does have chronic meniscal tear noted but [name] displacement.  Minimal osteoarthritic changes.  Trace effusion noted.      Impression and Recommendations:     This case required medical decision making of moderate complexity. The above documentation has been reviewed and is accurate and complete Jeffery Pulley, DO       Note: This dictation was prepared with Dragon dictation along with smaller phrase technology. Any transcriptional errors that result from this process are unintentional.

## 2019-04-15 ENCOUNTER — Other Ambulatory Visit: Payer: Self-pay

## 2019-04-15 ENCOUNTER — Ambulatory Visit (INDEPENDENT_AMBULATORY_CARE_PROVIDER_SITE_OTHER): Payer: BC Managed Care – PPO | Admitting: Family Medicine

## 2019-04-15 ENCOUNTER — Encounter: Payer: Self-pay | Admitting: Family Medicine

## 2019-04-15 DIAGNOSIS — S83242A Other tear of medial meniscus, current injury, left knee, initial encounter: Secondary | ICD-10-CM | POA: Insufficient documentation

## 2019-04-15 DIAGNOSIS — M216X1 Other acquired deformities of right foot: Secondary | ICD-10-CM | POA: Diagnosis not present

## 2019-04-15 NOTE — Patient Instructions (Signed)
[employer] orthotics Rocker bottom shoes  Body helix knee compression with work Follow up in 5-6 weeks

## 2019-04-15 NOTE — Assessment & Plan Note (Signed)
Discussed orthotics  Discussed home exercises  rtc in 4 weeks

## 2019-04-15 NOTE — Assessment & Plan Note (Signed)
Patient does have more of a meniscal tear.  Discussed with patient in great great length.  Home exercise icing regimen.  Which activities to do which wants to avoid.  We discussed compression.  And avoiding twisting motions.  Follow-up again in 4 to 8 weeks

## 2019-04-17 ENCOUNTER — Telehealth: Payer: Self-pay | Admitting: Internal Medicine

## 2019-04-17 NOTE — Telephone Encounter (Signed)
Pt called and states that he has a lump in his groin area, he noticed it last night on his right side. It does not hurt but wanted to know does he need to be concerned.

## 2019-04-17 NOTE — Telephone Encounter (Signed)
He will need an appointment

## 2019-04-18 NOTE — Telephone Encounter (Signed)
Pt advised he will call on Monday if he still needs an appointment , due to some improvement .

## 2019-04-23 ENCOUNTER — Encounter: Payer: Self-pay | Admitting: Family Medicine

## 2019-04-23 ENCOUNTER — Other Ambulatory Visit: Payer: Self-pay

## 2019-04-23 ENCOUNTER — Ambulatory Visit (INDEPENDENT_AMBULATORY_CARE_PROVIDER_SITE_OTHER): Payer: BC Managed Care – PPO | Admitting: Family Medicine

## 2019-04-23 VITALS — BP 122/82 | HR 84 | Temp 98.3°F | Wt 188.2 lb

## 2019-04-23 DIAGNOSIS — K4090 Unilateral inguinal hernia, without obstruction or gangrene, not specified as recurrent: Secondary | ICD-10-CM

## 2019-04-23 NOTE — Patient Instructions (Signed)
Inguinal Hernia, Adult °An inguinal hernia develops when fat or the intestines push through a weak spot in a muscle where your leg meets your lower abdomen (groin). This creates a bulge. This kind of hernia could also be: °· In your scrotum, if you are male. °· In folds of skin around your vagina, if you are male. °There are three types of inguinal hernias: °· Hernias that can be pushed back into the abdomen (are reducible). This type rarely causes pain. °· Hernias that are not reducible (are incarcerated). °· Hernias that are not reducible and lose their blood supply (are strangulated). This type of hernia requires emergency surgery. °What are the causes? °This condition is caused by having a weak spot in the muscles or tissues in the groin. This weak spot develops over time. The hernia may poke through the weak spot when you suddenly strain your lower abdominal muscles, such as when you: °· Lift a heavy object. °· Strain to have a bowel movement. Constipation can lead to straining. °· Cough. °What increases the risk? °This condition is more likely to develop in: °· Men. °· Pregnant women. °· People who: °? Are overweight. °? Work in jobs that require long periods of standing or heavy lifting. °? Have had an inguinal hernia before. °? Smoke or have lung disease. These factors can lead to long-lasting (chronic) coughing. °What are the signs or symptoms? °Symptoms may depend on the size of the hernia. Often, a small inguinal hernia has no symptoms. Symptoms of a larger hernia may include: °· A lump in the groin area. This is easier to see when standing. It might not be visible when lying down. °· Pain or burning in the groin. This may get worse when lifting, straining, or coughing. °· A dull ache or a feeling of pressure in the groin. °· In men, an unusual lump in the scrotum. °Symptoms of a strangulated inguinal hernia may include: °· A bulge in your groin that is very painful and tender to the touch. °· A bulge  that turns red or purple. °· Fever, nausea, and vomiting. °· Inability to have a bowel movement or to pass gas. °How is this diagnosed? °This condition is diagnosed based on your symptoms, your medical history, and a physical exam. Your health care provider may feel your groin area and ask you to cough. °How is this treated? °Treatment depends on the size of your hernia and whether you have symptoms. If you do not have symptoms, your health care provider may have you watch your hernia carefully and have you come in for follow-up visits. If your hernia is large or if you have symptoms, you may need surgery to repair the hernia. °Follow these instructions at home: °Lifestyle °· Avoid lifting heavy objects. °· Avoid standing for long periods of time. °· Do not use any products that contain nicotine or tobacco, such as cigarettes and e-cigarettes. If you need help quitting, ask your health care provider. °· Maintain a healthy weight. °Preventing constipation °· Take actions to prevent constipation. Constipation leads to straining with bowel movements, which can make a hernia worse or cause a hernia repair to break down. Your health care provider may recommend that you: °? Drink enough fluid to keep your urine pale yellow. °? Eat foods that are high in fiber, such as fresh fruits and vegetables, whole grains, and beans. °? Limit foods that are high in fat and processed sugars, such as fried or sweet foods. °? Take an over-the-counter   or prescription medicine for constipation. °General instructions °· You may try to push the hernia back in place by very gently pressing on it while lying down. Do not try to force the bulge back in if it will not push in easily. °· Watch your hernia for any changes in shape, size, or color. Get help right away if you notice any changes. °· Take over-the-counter and prescription medicines only as told by your health care provider. °· Keep all follow-up visits as told by your health care  provider. This is important. °Contact a health care provider if: °· You have a fever. °· You develop new symptoms. °· Your symptoms get worse. °Get help right away if: °· You have pain in your groin that suddenly gets worse. °· You have a bulge in your groin that: °? Suddenly gets bigger and does not get smaller. °? Becomes red or purple or painful to the touch. °· You are a man and you have a sudden pain in your scrotum, or the size of your scrotum suddenly changes. °· You cannot push the hernia back in place by very gently pressing on it when you are lying down. Do not try to force the bulge back in if it will not push in easily. °· You have nausea or vomiting that does not go away. °· You have a fast heartbeat. °· You cannot have a bowel movement or pass gas. °These symptoms may represent a serious problem that is an emergency. Do not wait to see if the symptoms will go away. Get medical help right away. Call your local emergency services (911 in the [place] °Summary °· An inguinal hernia develops when fat or the intestines push through a weak spot in a muscle where your leg meets your lower abdomen (groin). °· This condition is caused by having a weak spot in muscles or tissue in your groin. °· Symptoms may depend on the size of the hernia, and they may include pain or swelling in your groin. A small inguinal hernia often has no symptoms. °· Treatment may not be needed if you do not have symptoms. If you have symptoms or a large hernia, you may need surgery to repair the hernia. °· Avoid lifting heavy objects. Also avoid standing for long amounts of time. °This information is not intended to replace advice given to you by your health care provider. Make sure you discuss any questions you have with your health care provider. °Document Released: 02/05/2009 Document Revised: 10/21/2017 Document Reviewed: 06/21/2017 °Elsevier Patient Education © 2020 [employer] ° °

## 2019-04-23 NOTE — Progress Notes (Signed)
   Subjective:    Patient ID: Jeffery [name], male    DOB: 1960/10/24, 58 y.o.   MRN: [id number]  HPI He noted the onset of swelling in the right inguinal area within the last several days.  No history of injury or excessive straining.   Review of Systems     Objective:   Physical Exam Exam of the inguinal area does show a hernia present on the right.  Testes normal.  No other masses palpable.      Assessment & Plan:  Non-recurrent unilateral inguinal hernia without obstruction or gangrene - Plan: Ambulatory referral to General Surgery, I discussed the diagnosis and potential treatment.  Information was given concerning this in his AVS.  He will also be referred to surgery.

## 2019-05-27 ENCOUNTER — Other Ambulatory Visit: Payer: Self-pay

## 2019-05-27 ENCOUNTER — Ambulatory Visit: Payer: BC Managed Care – PPO | Admitting: Family Medicine

## 2019-05-27 ENCOUNTER — Encounter: Payer: Self-pay | Admitting: Family Medicine

## 2019-05-27 DIAGNOSIS — M216X1 Other acquired deformities of right foot: Secondary | ICD-10-CM | POA: Diagnosis not present

## 2019-05-27 DIAGNOSIS — S83242A Other tear of medial meniscus, current injury, left knee, initial encounter: Secondary | ICD-10-CM

## 2019-05-27 NOTE — Assessment & Plan Note (Signed)
Changes made to custom orthotics today.  Tolerated the procedure well  Patient may need another pair in the near future.  Patient will consider this.  Consider home exercises, icing regimen, which activities to do which wants to avoid.  If patient wants to manage it patient is custom orthotics in the near future.[name]

## 2019-05-27 NOTE — Assessment & Plan Note (Signed)
Continued mild pain, and discussed the possibility of PT but patient is making improvement.  Patient should do well.  Follow-up again in 4 to 8 weeks.

## 2019-05-27 NOTE — Progress Notes (Signed)
[name] [address] [place], [place] [address] Phone: [phone] Subjective:   [name] am serving as a Education administrator for Dr. Hulan Saas.  I'm seeing this patient by the request  of:    CC: Foot pain and knee pain follow-up  [id number]   04/15/2019 Patient does have more of a meniscal tear.  Discussed with patient in great great length.  Home exercise icing regimen.  Which activities to do which wants to avoid.  We discussed compression.  And avoiding twisting motions.  Follow-up again in 4 to 8 weeks  05/27/2019 Jeffery [name] is a 58 y.o. male coming in with complaint of right foot pain. States that he feels better. Knee is dong better. States that he has been doing his exercises.  Patient states that the knee is feeling 90% better.  Still with physical sensation can have some discomfort and pain on that can stop patient in his tracks.  Patient states most daily activities has improved significantly.     Past Medical History:  Diagnosis Date  . Anemia   . Central serous retinopathy    hx/o intraocular injection therapy for 3 years; no change after 3 years of therapy, no visual c/o  . Depression   . Former smoker   . History of blood transfusion 04/17/2017   "related to anemia"  . History of kidney stones   . Hypertension   . Left varicocele   . Port-wine stain of face   . Pre-diabetes    Past Surgical History:  Procedure Laterality Date  . COLONOSCOPY     never  . EXTRACORPOREAL SHOCK WAVE LITHOTRIPSY    . PALATE / UVULA BIOPSY / EXCISION  2006   "polyp on uvula cut out"   Social History   Socioeconomic History  . Marital status: Divorced    Spouse name: Not on file  . Number of children: Not on file  . Years of education: Not on file  . Highest education level: Not on file  Occupational History  . Not on file  Social Needs  . Financial resource strain: Not on file  . Food insecurity    Worry: Not on file   Inability: Not on file  . Transportation needs    Medical: Not on file    Non-medical: Not on file  Tobacco Use  . Smoking status: Former Smoker    Packs/day: 1.00    Years: 15.00    Pack years: 15.00    Types: Cigarettes    Quit date: 12/01/1997    Years since quitting: 21.4  . Smokeless tobacco: Never Used  Substance and Sexual Activity  . Alcohol use: Yes    Comment: Beer.  . Drug use: No  . Sexual activity: Not Currently  Lifestyle  . Physical activity    Days per week: Not on file    Minutes per session: Not on file  . Stress: Not on file  Relationships  . Social Herbalist on phone: Not on file    Gets together: Not on file    Attends religious service: Not on file    Active member of club or organization: Not on file    Attends meetings of clubs or organizations: Not on file    Relationship status: Not on file  Other Topics Concern  . Not on file  Social History Narrative   Divorced 04/2012, 2 children ages daughter 54yo, son 71yo; exercise - moving  on the job, walking, works as Electrical engineer   Allergies  Allergen Reactions  . Penicillins Other (See Comments)    Hands & feet peel. "childhood allergy"   Family History  Problem Relation Age of Onset  . Heart disease Mother        pacemaker  . Heart disease Father 31       died of MI, 68s  . Diabetes Father   . Stroke Father   . Migraines Sister   . Mental illness Sister   . Cancer Neg Hx   . Hyperlipidemia Neg Hx   . Hypertension Neg Hx   . Colon cancer Neg Hx      Current Outpatient Medications (Cardiovascular):  .  amLODipine (NORVASC) 5 MG tablet, Take 1 tablet (5 mg total) by mouth daily. [name]  lisinopril-hydrochlorothiazide (PRINZIDE,ZESTORETIC) 20-12.5 MG tablet, Take 1 tablet by mouth daily.    Current Outpatient Medications (Hematological):  [name]  Cyanocobalamin (VITAMIN B 12 PO), Take 1 tablet by mouth. .  ferrous sulfate 325 (65 FE) MG tablet, Take 1 tablet (325 mg total) by mouth  2 (two) times daily with a meal. .  folic acid (FOLVITE) 1 MG tablet, Take 1 tablet (1 mg total) by mouth daily.  Current Outpatient Medications (Other):  [name]  B Complex-C (B-COMPLEX WITH VITAMIN C) tablet, Take 1 tablet by mouth daily. .  hydrocortisone (ANUSOL-HC) 2.5 % rectal cream, Place 1 application rectally 2 (two) times daily. .  mebendazole (VERMOX) 100 MG chewable tablet, 1 tablet now, repeat in 3 weeks .  Nutritional Supplements (VITAMIN D BOOSTER PO), Take by mouth. .  phytonadione (VITAMIN K) 5 MG tablet, Take 2 tablets (10 mg total) by mouth daily. .  polyethylene glycol (MIRALAX / GLYCOLAX) packet, Take 17 g by mouth daily as needed. .  Potassium 99 MG TABS, Take 2 tablets by mouth. .  thiamine 100 MG tablet, Take 1 tablet (100 mg total) by mouth daily. .  Vitamin D, Ergocalciferol, (DRISDOL) 50000 units CAPS capsule, TAKE 1 CAPSULE (50,000 UNITS TOTAL) BY MOUTH EVERY 7 (SEVEN) DAYS.    Past medical history, social, surgical and family history all reviewed in electronic medical record.  No pertanent information unless stated regarding to the chief complaint.   Review of Systems:  No headache, visual changes, nausea, vomiting, diarrhea, constipation, dizziness, abdominal pain, skin rash, fevers, chills, night sweats, weight loss, swollen lymph nodes, body aches, joint swelling, muscle aches, chest pain, shortness of breath, mood changes.   Objective  Blood pressure 110/74, pulse 85, height 6\' 2"  (1.88 m), weight 193 lb (87.5 kg), SpO2 97 %. Systems examined below as of    General: No apparent distress alert and oriented x3 mood and affect normal, dressed appropriately.  HEENT: Pupils equal, extraocular movements intact  Respiratory: Patient's speak in full sentences and does not appear short of breath  Cardiovascular: No lower extremity edema, non tender, no erythema  Skin: Warm dry intact with no signs of infection or rash on extremities or on axial skeleton.  Abdomen: Soft  nontender  Neuro: Cranial nerves II through XII are intact, neurovascularly intact in all extremities with 2+ DTRs and 2+ pulses.  Lymph: No lymphadenopathy of posterior or anterior cervical chain or axillae bilaterally.  Gait normal with good balance and coordination.  MSK:  Non tender with full range of motion and good stability and symmetric strength and tone of shoulders, elbows, wrist, hip and ankles bilaterally.  Left knee range of motion Tenderness: Medial  joint line with positive McMurray's otherwise full range of motion with full strength and 5 out of 5 strength.  Foot exam does show to breakdown of the transverse arch bilaterally right and left.  Patient does have some mild overpronation of the hindfoot bilaterally as well.    Impression and Recommendations:     The above documentation has been reviewed and is accurate and complete Lyndal Pulley, DO       Note: This dictation was prepared with Dragon dictation along with smaller phrase technology. Any transcriptional errors that result from this process are unintentional.

## 2019-05-27 NOTE — Patient Instructions (Addendum)
Don't wear compression as much Continue the exercises 3 times a week COQ10 200 mg Orthotics code [id number] See me again in 6 weeks

## 2019-06-11 ENCOUNTER — Telehealth: Payer: Self-pay

## 2019-06-11 NOTE — Telephone Encounter (Signed)
Called patient to schedule orthotics appointment. Left message.

## 2019-07-07 NOTE — Progress Notes (Signed)
[name] [place], [place] [address] Phone: [phone] Subjective:   Jeffery [name], am serving as a scribe for Dr. Hulan Saas.    CC: Left knee and foot pain  [id number]   8.24.2020 Continued mild pain, and discussed the possibility of PT but patient is making improvement.  Patient should do well.  Follow-up again in 4 to 8 weeks.  Changes made to custom orthotics today.  Tolerated the procedure well  Patient may need another pair in the near future.  Patient will consider this.  Consider home exercises, icing regimen, which activities to do which wants to avoid.  If patient wants to manage it patient is custom orthotics in the near future.[name]  Update 07/08/2019 Jeffery [name] is a 58 y.o. male coming in with complaint of left knee pain. Pain improves when he does exercises. When he stops the exercises his pain comes back. Patient reports doing repetitive motions at work. Does wear sleeve at work.   Foot pain is improving. Is also wanting to get custom orthotics. Patient takes CoQ10 daily and this is also helping.     Past Medical History:  Diagnosis Date  . Anemia   . Central serous retinopathy    hx/o intraocular injection therapy for 3 years; [name] change after 3 years of therapy, [name] visual c/o  . Depression   . Former smoker   . History of blood transfusion 04/17/2017   "related to anemia"  . History of kidney stones   . Hypertension   . Left varicocele   . Port-wine stain of face   . Pre-diabetes    Past Surgical History:  Procedure Laterality Date  . COLONOSCOPY     never  . EXTRACORPOREAL SHOCK WAVE LITHOTRIPSY    . PALATE / UVULA BIOPSY / EXCISION  2006   "polyp on uvula cut out"   Social History   Socioeconomic History  . Marital status: Divorced    Spouse name: Not on file  . Number of children: Not on file  . Years of education: Not on file  . Highest education level: Not on file  Occupational  History  . Not on file  Social Needs  . Financial resource strain: Not on file  . Food insecurity    Worry: Not on file    Inability: Not on file  . Transportation needs    Medical: Not on file    Non-medical: Not on file  Tobacco Use  . Smoking status: Former Smoker    Packs/day: 1.00    Years: 15.00    Pack years: 15.00    Types: Cigarettes    Quit date: 12/01/1997    Years since quitting: 21.6  . Smokeless tobacco: Never Used  Substance and Sexual Activity  . Alcohol use: Yes    Comment: Beer.  . Drug use: [name]  . Sexual activity: Not Currently  Lifestyle  . Physical activity    Days per week: Not on file    Minutes per session: Not on file  . Stress: Not on file  Relationships  . Social Herbalist on phone: Not on file    Gets together: Not on file    Attends religious service: Not on file    Active member of club or organization: Not on file    Attends meetings of clubs or organizations: Not on file    Relationship status: Not on file  Other Topics Concern  .  Not on file  Social History Narrative   Divorced 04/2012, 2 children ages daughter 56yo, son 44yo; exercise - moving on the job, walking, works as Electrical engineer   Allergies  Allergen Reactions  . Penicillins Other (See Comments)    Hands & feet peel. "childhood allergy"   Family History  Problem Relation Age of Onset  . Heart disease Mother        pacemaker  . Heart disease Father 66       died of MI, 64s  . Diabetes Father   . Stroke Father   . Migraines Sister   . Mental illness Sister   . Cancer Neg Hx   . Hyperlipidemia Neg Hx   . Hypertension Neg Hx   . Colon cancer Neg Hx      Current Outpatient Medications (Cardiovascular):  .  amLODipine (NORVASC) 5 MG tablet, Take 1 tablet (5 mg total) by mouth daily. [name]  lisinopril-hydrochlorothiazide (PRINZIDE,ZESTORETIC) 20-12.5 MG tablet, Take 1 tablet by mouth daily.    Current Outpatient Medications (Hematological):  [name]   Cyanocobalamin (VITAMIN B 12 PO), Take 1 tablet by mouth. .  ferrous sulfate 325 (65 FE) MG tablet, Take 1 tablet (325 mg total) by mouth 2 (two) times daily with a meal. .  folic acid (FOLVITE) 1 MG tablet, Take 1 tablet (1 mg total) by mouth daily.  Current Outpatient Medications (Other):  [name]  B Complex-C (B-COMPLEX WITH VITAMIN C) tablet, Take 1 tablet by mouth daily. .  hydrocortisone (ANUSOL-HC) 2.5 % rectal cream, Place 1 application rectally 2 (two) times daily. .  mebendazole (VERMOX) 100 MG chewable tablet, 1 tablet now, repeat in 3 weeks .  Nutritional Supplements (VITAMIN D BOOSTER PO), Take by mouth. .  phytonadione (VITAMIN K) 5 MG tablet, Take 2 tablets (10 mg total) by mouth daily. .  polyethylene glycol (MIRALAX / GLYCOLAX) packet, Take 17 g by mouth daily as needed. .  Potassium 99 MG TABS, Take 2 tablets by mouth. .  thiamine 100 MG tablet, Take 1 tablet (100 mg total) by mouth daily. .  Vitamin D, Ergocalciferol, (DRISDOL) 50000 units CAPS capsule, TAKE 1 CAPSULE (50,000 UNITS TOTAL) BY MOUTH EVERY 7 (SEVEN) DAYS.    Past medical history, social, surgical and family history all reviewed in electronic medical record.  [name] pertanent information unless stated regarding to the chief complaint.   Review of Systems:  [name] headache, visual changes, nausea, vomiting, diarrhea, constipation, dizziness, abdominal pain, skin rash, fevers, chills, night sweats, weight loss, swollen lymph nodes, body aches, joint swelling, muscle aches, chest pain, shortness of breath, mood changes.   Objective  Blood pressure 122/84, pulse 76, height 6\' 2"  (1.88 m), SpO2 98 %.    General: [name] apparent distress alert and oriented x3 mood and affect normal, dressed appropriately.  HEENT: Pupils equal, extraocular movements intact  Respiratory: Patient's speak in full sentences and does not appear short of breath  Cardiovascular: [name] lower extremity edema, non tender, [name] erythema  Skin: Warm dry intact  with [name] signs of infection or rash on extremities or on axial skeleton.  Abdomen: Soft nontender  Neuro: Cranial nerves II through XII are intact, neurovascularly intact in all extremities with 2+ DTRs and 2+ pulses.  Lymph: [name] lymphadenopathy of posterior or anterior cervical chain or axillae bilaterally.  Gait normal with good balance and coordination.  MSK:  tender with full range of motion and good stability and symmetric strength and tone of shoulders, elbows, wrist, hip, and  ankles bilaterally.  Knee: Left Normal to inspection with [name] erythema or effusion or obvious bony abnormalities. Palpation normal with [name] warmth, joint line tenderness, patellar tenderness, or condyle tenderness. ROM full in flexion and extension and lower leg rotation. Ligaments with solid consistent endpoints including ACL, PCL, LCL, MCL. Negative Mcmurray's, Apley's, and Thessalonian tests. Non painful patellar compression. Patellar glide without crepitus. Patellar and quadriceps tendons unremarkable. Hamstring and quadriceps strength is normal. Contralateral knee unremarkable   Impression and Recommendations:      The above documentation has been reviewed and is accurate and complete Lyndal Pulley, DO       Note: This dictation was prepared with Dragon dictation along with smaller phrase technology. Any transcriptional errors that result from this process are unintentional.

## 2019-07-08 ENCOUNTER — Encounter: Payer: Self-pay | Admitting: Family Medicine

## 2019-07-08 ENCOUNTER — Other Ambulatory Visit: Payer: Self-pay

## 2019-07-08 ENCOUNTER — Ambulatory Visit (INDEPENDENT_AMBULATORY_CARE_PROVIDER_SITE_OTHER): Payer: BC Managed Care – PPO | Admitting: Family Medicine

## 2019-07-08 DIAGNOSIS — M216X1 Other acquired deformities of right foot: Secondary | ICD-10-CM

## 2019-07-08 DIAGNOSIS — S83242A Other tear of medial meniscus, current injury, left knee, initial encounter: Secondary | ICD-10-CM

## 2019-07-08 NOTE — Assessment & Plan Note (Signed)
Patient has been making some progress.  Discussed posture and numbness, discussed which activities to do which was completed.  Encourage patient to do more icing on a regular basis.  I would like to see patient 1 more time in 5 to 6 weeks.

## 2019-07-08 NOTE — Patient Instructions (Signed)
Keep up w/ the exercises.  Try the Pennsaid.  Ice at the end of the day.  See me again in 4-5 weeks for orthotics.

## 2019-07-08 NOTE — Assessment & Plan Note (Signed)
Patient will be fitted for custom orthotics in the next month.  Has had good response to these in the past.

## 2019-08-05 ENCOUNTER — Encounter: Payer: Self-pay | Admitting: Family Medicine

## 2019-08-05 ENCOUNTER — Ambulatory Visit (INDEPENDENT_AMBULATORY_CARE_PROVIDER_SITE_OTHER): Payer: BC Managed Care – PPO | Admitting: Family Medicine

## 2019-08-05 ENCOUNTER — Other Ambulatory Visit: Payer: Self-pay

## 2019-08-05 DIAGNOSIS — M216X1 Other acquired deformities of right foot: Secondary | ICD-10-CM

## 2019-08-05 NOTE — Progress Notes (Signed)
Procedure Note   Patient was fitted for a : standard, cushioned, semi-rigid orthotic. The orthotic was heated and afterward the patient patient seated position and molded The patient was positioned in subtalar neutral position and 10 degrees of ankle dorsiflexion in a weight bearing stance. After completion of molding, patient did have orthotic management The blank was ground to a stable position for weight bearing. Size: 11  Base: Carbon fiber Additional Posting and Padding: left and right medial 300/120 300/140 left transverse 250/35 right transverse 250/60 The patient ambulated these, and they were very comfortable.

## 2019-08-05 NOTE — Assessment & Plan Note (Signed)
Discussed increase wear slowly.  Discussed HEP  Discussed which activities  RTC in 4-8 weeks

## 2019-09-13 ENCOUNTER — Ambulatory Visit (INDEPENDENT_AMBULATORY_CARE_PROVIDER_SITE_OTHER)
Admission: RE | Admit: 2019-09-13 | Discharge: 2019-09-13 | Disposition: A | Discharge status: Home / Self Care | Payer: BC Managed Care – PPO | Source: Ambulatory Visit | Attending: Family Medicine | Admitting: Family Medicine

## 2019-09-13 ENCOUNTER — Other Ambulatory Visit: Payer: Self-pay

## 2019-09-13 ENCOUNTER — Encounter: Payer: Self-pay | Admitting: Family Medicine

## 2019-09-13 ENCOUNTER — Ambulatory Visit (INDEPENDENT_AMBULATORY_CARE_PROVIDER_SITE_OTHER): Payer: BC Managed Care – PPO | Admitting: Family Medicine

## 2019-09-13 VITALS — BP 120/80 | HR 72 | Ht 74.0 in | Wt 187.8 lb

## 2019-09-13 DIAGNOSIS — M545 Low back pain, unspecified: Secondary | ICD-10-CM

## 2019-09-13 DIAGNOSIS — G8929 Other chronic pain: Secondary | ICD-10-CM | POA: Diagnosis not present

## 2019-09-13 IMAGING — DX DG LUMBAR SPINE COMPLETE 4+V
5 series · 5 of 5 positions shown · non-contrast
Comparison: None.

CLINICAL DATA: Acute lower back pain without known injury.

EXAM:
LUMBAR SPINE - COMPLETE 4+ VIEW

[l-spine ap]
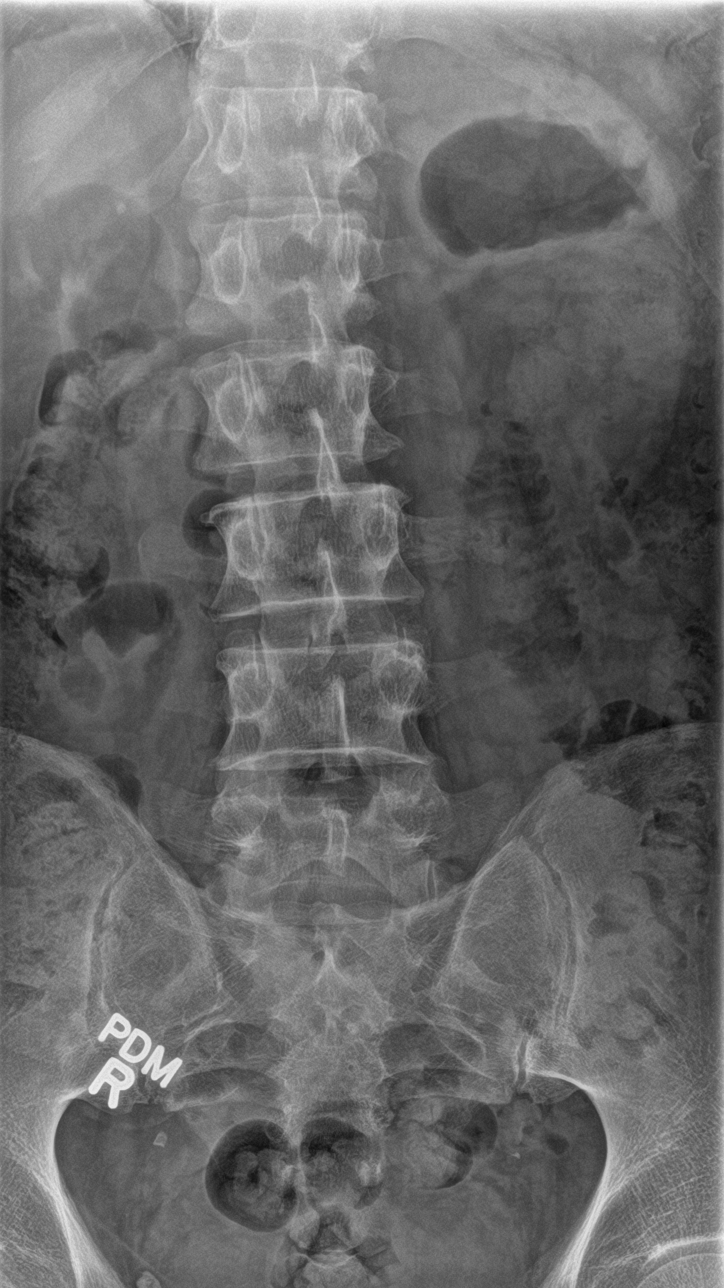

[l-spine obl (1 of 2)]
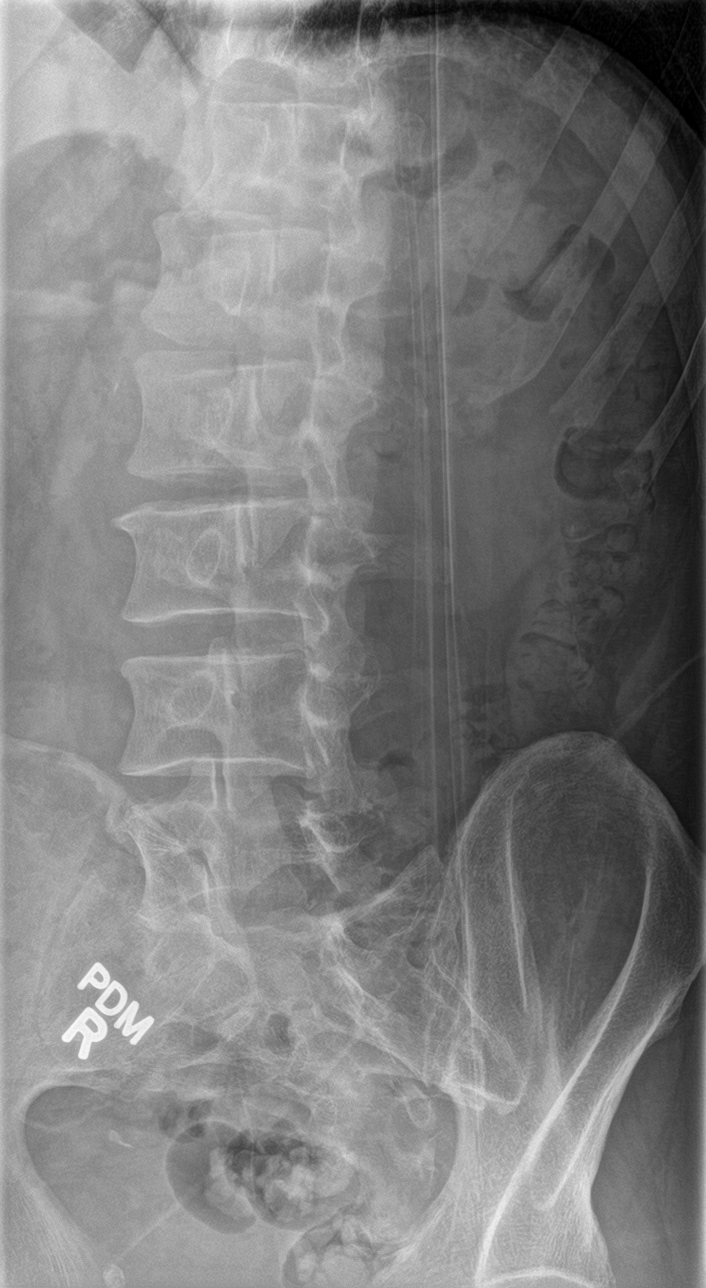

[l-spine obl (2 of 2)]
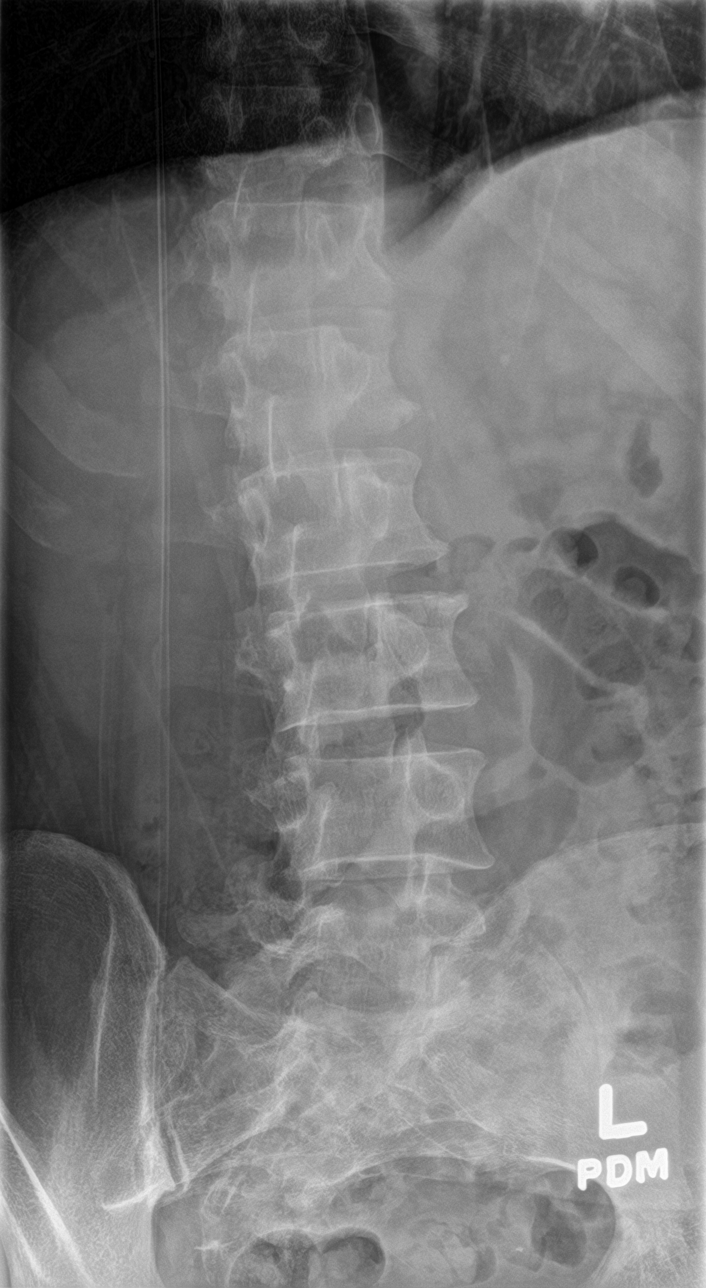

[l-spine lat]
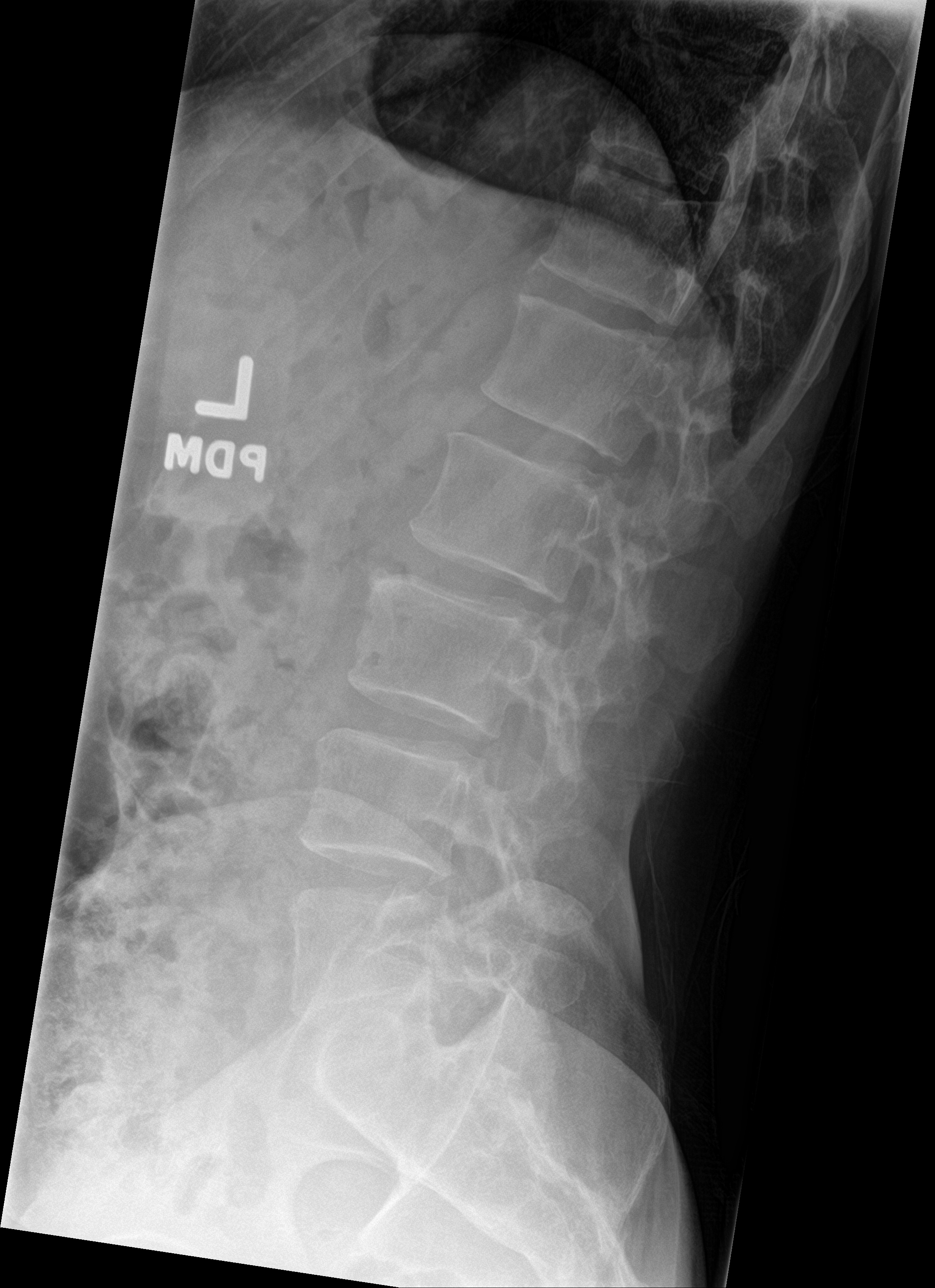

[l-spine spot]
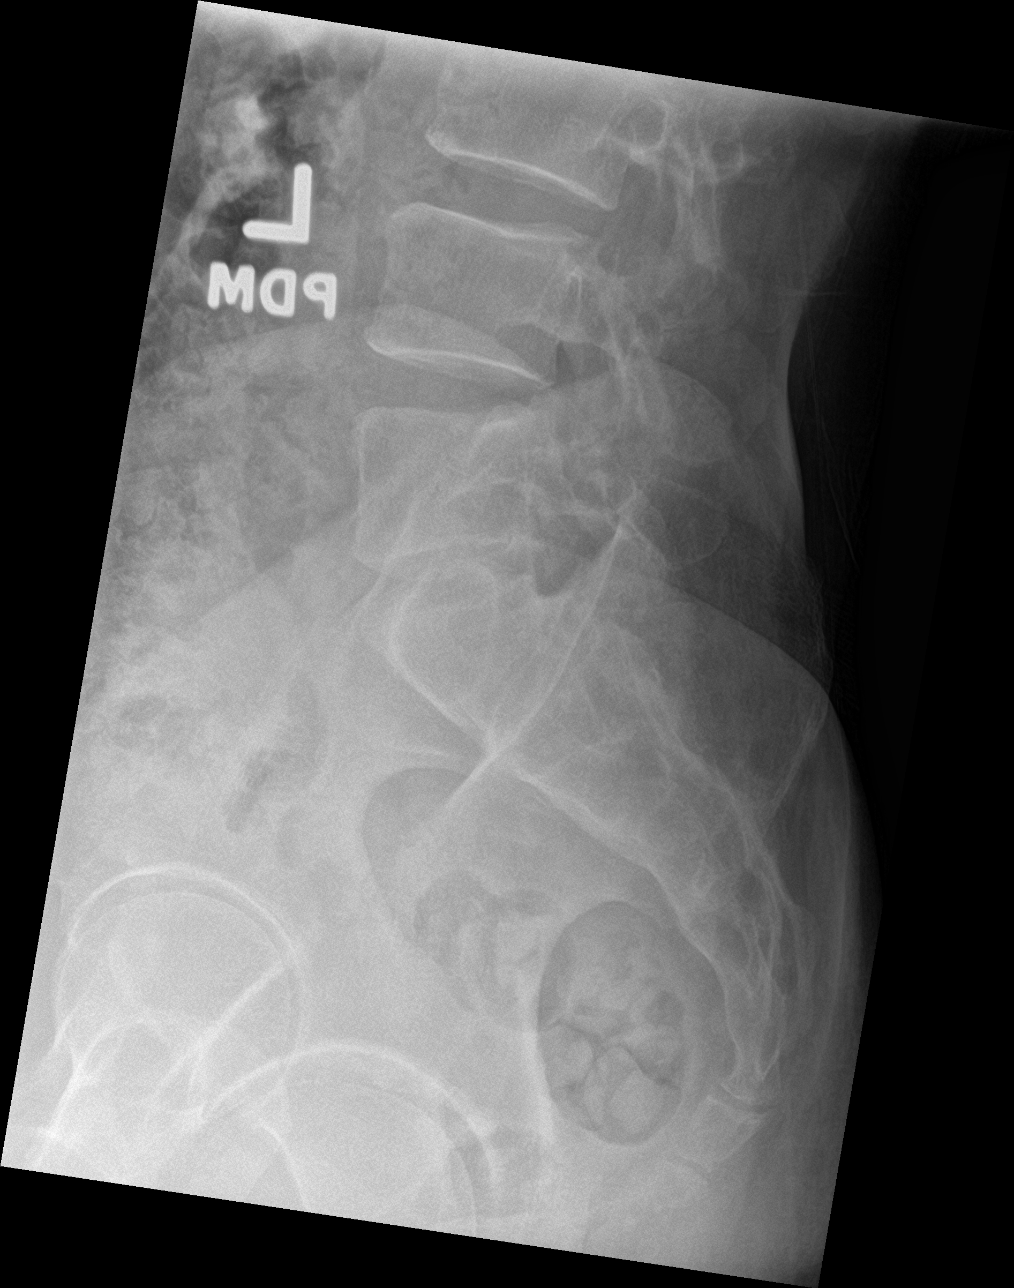

[5 of 5 positions shown; findings below may reference images not displayed]

FINDINGS: There is no evidence of lumbar spine fracture. Alignment is normal.
Intervertebral disc spaces are maintained.
IMPRESSION: Negative.

## 2019-09-13 NOTE — Patient Instructions (Signed)
Please do the exercises for your hip. See me again in 6-8 weeks.

## 2019-09-13 NOTE — Progress Notes (Signed)
[name] [place],  [address] Phone: [phone] Subjective:    CC: L knee and R foot pain  [id number]    I, Wendy Poet, LAT, ATC, am serving as scribe for Dr. Hulan Saas.  07/08/19: L knee pain: Patient has been making some progress.  Discussed posture and numbness, discussed which activities to do which was completed.  Encourage patient to do more icing on a regular basis.  I would like to see patient 1 more time in 5 to 6 weeks.  R foot pain:  Patient will be fitted for custom orthotics in the next month.  Has had good response to these in the past.  09/13/19: Jeffery [name] is a 58 y.o. male coming in for f/u L knee and R foot pain.  Pt had custom orthotics made on 08/05/19 and was previously seen on 07/08/19 for his L knee.  Since his last visit, pt states that his L knee and B feet have improved but his biggest c/o is lower back pain.  LBP x 2 weeks w no known MOI.  States that he was carrying a case of water into his house and noticed pain later that day.  He describes his pain as an aching, nagging pain that he rates as a 4-5/10.  He denies any radicular symptoms into his LEs.  Aggravating factors include slight lumbar flexion.  He has been taking Advil and trying to take it easy at work.      Past Medical History:  Diagnosis Date  . Anemia   . Central serous retinopathy    hx/o intraocular injection therapy for 3 years; no change after 3 years of therapy, no visual c/o  . Depression   . Former smoker   . History of blood transfusion 04/17/2017   "related to anemia"  . History of kidney stones   . Hypertension   . Left varicocele   . Port-wine stain of face   . Pre-diabetes    Past Surgical History:  Procedure Laterality Date  . COLONOSCOPY     never  . EXTRACORPOREAL SHOCK WAVE LITHOTRIPSY    . PALATE / UVULA BIOPSY / EXCISION  2006   "polyp on uvula cut out"   Social History   Socioeconomic  History  . Marital status: Divorced    Spouse name: Not on file  . Number of children: Not on file  . Years of education: Not on file  . Highest education level: Not on file  Occupational History  . Not on file  Tobacco Use  . Smoking status: Former Smoker    Packs/day: 1.00    Years: 15.00    Pack years: 15.00    Types: Cigarettes    Quit date: 12/01/1997    Years since quitting: 21.7  . Smokeless tobacco: Never Used  Substance and Sexual Activity  . Alcohol use: Yes    Comment: Beer.  . Drug use: No  . Sexual activity: Not Currently  Other Topics Concern  . Not on file  Social History Narrative   Divorced 04/2012, 2 children ages daughter 17yo, son 5yo; exercise - moving on the job, walking, works as Electrical engineer   Social Determinants of Radio broadcast assistant Strain:   . Difficulty of Paying Living Expenses: Not on file  Food Insecurity:   . Worried About Charity fundraiser in the Last Year: Not on file  . Ran Out of Food in the  Last Year: Not on file  Transportation Needs:   . Lack of Transportation (Medical): Not on file  . Lack of Transportation (Non-Medical): Not on file  Physical Activity:   . Days of Exercise per Week: Not on file  . Minutes of Exercise per Session: Not on file  Stress:   . Feeling of Stress : Not on file  Social Connections:   . Frequency of Communication with Friends and Family: Not on file  . Frequency of Social Gatherings with Friends and Family: Not on file  . Attends Religious Services: Not on file  . Active Member of Clubs or Organizations: Not on file  . Attends Archivist Meetings: Not on file  . Marital Status: Not on file   Allergies  Allergen Reactions  . Penicillins Other (See Comments)    Hands & feet peel. "childhood allergy"   Family History  Problem Relation Age of Onset  . Heart disease Mother        pacemaker  . Heart disease Father 24       died of MI, 78s  . Diabetes Father   . Stroke  Father   . Migraines Sister   . Mental illness Sister   . Cancer Neg Hx   . Hyperlipidemia Neg Hx   . Hypertension Neg Hx   . Colon cancer Neg Hx      Current Outpatient Medications (Cardiovascular):  .  amLODipine (NORVASC) 5 MG tablet, Take 1 tablet (5 mg total) by mouth daily. [name]  lisinopril-hydrochlorothiazide (PRINZIDE,ZESTORETIC) 20-12.5 MG tablet, Take 1 tablet by mouth daily.    Current Outpatient Medications (Hematological):  [name]  Cyanocobalamin (VITAMIN B 12 PO), Take 1 tablet by mouth. .  ferrous sulfate 325 (65 FE) MG tablet, Take 1 tablet (325 mg total) by mouth 2 (two) times daily with a meal. .  folic acid (FOLVITE) 1 MG tablet, Take 1 tablet (1 mg total) by mouth daily.  Current Outpatient Medications (Other):  [name]  B Complex-C (B-COMPLEX WITH VITAMIN C) tablet, Take 1 tablet by mouth daily. .  hydrocortisone (ANUSOL-HC) 2.5 % rectal cream, Place 1 application rectally 2 (two) times daily. .  mebendazole (VERMOX) 100 MG chewable tablet, 1 tablet now, repeat in 3 weeks .  Nutritional Supplements (VITAMIN D BOOSTER PO), Take by mouth. .  phytonadione (VITAMIN K) 5 MG tablet, Take 2 tablets (10 mg total) by mouth daily. .  polyethylene glycol (MIRALAX / GLYCOLAX) packet, Take 17 g by mouth daily as needed. .  Potassium 99 MG TABS, Take 2 tablets by mouth. .  thiamine 100 MG tablet, Take 1 tablet (100 mg total) by mouth daily. .  Vitamin D, Ergocalciferol, (DRISDOL) 50000 units CAPS capsule, TAKE 1 CAPSULE (50,000 UNITS TOTAL) BY MOUTH EVERY 7 (SEVEN) DAYS.    Past medical history, social, surgical and family history all reviewed in electronic medical record.  No pertanent information unless stated regarding to the chief complaint.   Review of Systems:  No headache, visual changes, nausea, vomiting, diarrhea, constipation, dizziness, abdominal pain, skin rash, fevers, chills, night sweats, weight loss, swollen lymph nodes, body aches, joint swelling,  chest pain, shortness  of breath, mood changes.   Objective  Blood pressure 120/80, pulse 72, height 6\' 2"  (1.88 m), weight 187 lb 12.8 oz (85.2 kg), SpO2 98 %.   General: No apparent distress alert and oriented x3 mood and affect normal, dressed appropriately.  HEENT: Pupils equal, extraocular movements intact  Respiratory: Patient's speak in full  sentences and does not appear short of breath  Cardiovascular: No lower extremity edema, non tender, no erythema  Skin: Warm dry intact with no signs of infection or rash on extremities or on axial skeleton.  Abdomen: Soft nontender  Neuro: Cranial nerves II through XII are intact, neurovascularly intact in all extremities with 2+ DTRs and 2+ pulses.  Lymph: No lymphadenopathy of posterior or anterior cervical chain or axillae bilaterally.  Gait normal with good balance and coordination.  MSK:  tender with full range of motion and good stability and symmetric strength and tone of shoulders, elbows, wrist, hip, knee and ankles bilaterally.  Patient is back does have some mild scoliosis noted in the thoracolumbar juncture on the right side.  Patient is tender to palpation over the sacroiliac joints bilaterally.  5 out of 5 strength of the lower extremities bilaterally and deep tendon reflexes intact.  Neurovascular intact distally   [id number]; 15 additional minutes spent for Therapeutic exercises as stated in above notes.  This included exercises focusing on stretching, strengthening, with significant focus on eccentric aspects.   Long term goals include an improvement in range of motion, strength, endurance as well as avoiding reinjury. Patient's frequency would include in 1-2 times a day, 3-5 times a week for a duration of 6-12 weeks. Low back exercises that included:  Pelvic tilt/bracing instruction to focus on control of the pelvic girdle and lower abdominal muscles  Glute strengthening exercises, focusing on proper firing of the glutes without engaging the low back  muscles Proper stretching techniques for maximum relief for the hamstrings, hip flexors, low back and some rotation where tolerated   Proper technique shown and discussed handout in great detail with ATC.  All questions were discussed and answered.      Impression and Recommendations:     This case required medical decision making of moderate complexity. The above documentation has been reviewed and is accurate and complete Lyndal Pulley, DO       Note: This dictation was prepared with Dragon dictation along with smaller phrase technology. Any transcriptional errors that result from this process are unintentional.

## 2019-09-13 NOTE — Assessment & Plan Note (Signed)
Patient is low back pain to be multifactorial, patient having recurrence spasm from time to time.  Patient elected to try conservative therapy at this time.  We discussed posture and ergonomics, which activities to do which wants to avoid.  Patient work with Product/process development scientist.  Patient will get x-rays to further evaluate.  Follow-up with me again in 4 to 8 weeks

## 2019-09-16 ENCOUNTER — Other Ambulatory Visit: Payer: Self-pay | Admitting: Family Medicine

## 2019-09-16 DIAGNOSIS — I10 Essential (primary) hypertension: Secondary | ICD-10-CM

## 2019-10-25 ENCOUNTER — Ambulatory Visit: Payer: BC Managed Care – PPO | Admitting: Family Medicine

## 2019-12-04 ENCOUNTER — Other Ambulatory Visit: Payer: Self-pay | Admitting: Family Medicine

## 2019-12-04 DIAGNOSIS — I10 Essential (primary) hypertension: Secondary | ICD-10-CM

## 2019-12-09 ENCOUNTER — Encounter: Payer: Self-pay | Admitting: Family Medicine

## 2019-12-09 ENCOUNTER — Ambulatory Visit: Payer: BC Managed Care – PPO | Admitting: Family Medicine

## 2019-12-09 ENCOUNTER — Other Ambulatory Visit: Payer: Self-pay

## 2019-12-09 DIAGNOSIS — M7062 Trochanteric bursitis, left hip: Secondary | ICD-10-CM | POA: Diagnosis not present

## 2019-12-09 NOTE — Patient Instructions (Signed)
Good to see you Exercise 3 times a week Ice after activity See me again in 4 weeks

## 2019-12-09 NOTE — Assessment & Plan Note (Addendum)
Patient was given injection today, tolerated the procedure well, discussed icing regimen and home exercise, which activities to do which was to avoid.  Patient should do relatively well.  Home exercises given.  Follow-up again in 4 to 6 weeks

## 2019-12-09 NOTE — Progress Notes (Signed)
[facility] [place] Phone: [phone] Subjective:   [name], am serving as a scribe for Dr. Hulan Saas.This visit occurred during the SARS-CoV-2 public health emergency.  Safety protocols were in place, including screening questions prior to the visit, additional usage of staff PPE, and extensive cleaning of exam room while observing appropriate contact time as indicated for disinfecting solutions.  I'm seeing this patient by the request  of:  Denita Lung, MD  CC: Left hip pain  [id number]   09/13/2019 Patient is low back pain to be multifactorial, patient having recurrence spasm from time to time.  Patient elected to try conservative therapy at this time.  We discussed posture and ergonomics, which activities to do which wants to avoid.  Patient work with Product/process development scientist.  Patient will get x-rays to further evaluate.  Follow-up with me again in 4 to 8 weeks  Update 12/09/2019 Jeff[name] is a 59 y.o. male coming in with complaint of chronic left hip pain. Patient states his left hip is bothering him today. Pain over greater trochanter. Pain with lying on his side and with sitting. States that he tries to move around to alleviate his pain.  Rates the severity of pain is 5 out of 10.  Patient feels like this is a new problem.  Does not seem to be quite the same as the contralateral side.      Past Medical History:  Diagnosis Date  . Anemia   . Central serous retinopathy    hx/o intraocular injection therapy for 3 years; [name] change after 3 years of therapy, [name] visual c/o  . Depression   . Former smoker   . History of blood transfusion 04/17/2017   "related to anemia"  . History of kidney stones   . Hypertension   . Left varicocele   . Port-wine stain of face   . Pre-diabetes    Past Surgical History:  Procedure Laterality Date  . COLONOSCOPY     never  . EXTRACORPOREAL SHOCK WAVE LITHOTRIPSY     . PALATE / UVULA BIOPSY / EXCISION  2006   "polyp on uvula cut out"   Social History   Socioeconomic History  . Marital status: Divorced    Spouse name: Not on file  . Number of children: Not on file  . Years of education: Not on file  . Highest education level: Not on file  Occupational History  . Not on file  Tobacco Use  . Smoking status: Former Smoker    Packs/day: 1.00    Years: 15.00    Pack years: 15.00    Types: Cigarettes    Quit date: 12/01/1997    Years since quitting: 22.0  . Smokeless tobacco: Never Used  Substance and Sexual Activity  . Alcohol use: Yes    Comment: Beer.  . Drug use: [name]  . Sexual activity: Not Currently  Other Topics Concern  . Not on file  Social History Narrative   Divorced 04/2012, 2 children ages daughter 67yo, son 4yo; exercise - moving on the job, walking, works as Electrical engineer   Social Determinants of Radio broadcast assistant Strain:   . Difficulty of Paying Living Expenses: Not on file  Food Insecurity:   . Worried About Charity fundraiser in the Last Year: Not on file  . Ran Out of Food in the Last Year: Not on file  Transportation Needs:   .  Lack of Transportation (Medical): Not on file  . Lack of Transportation (Non-Medical): Not on file  Physical Activity:   . Days of Exercise per Week: Not on file  . Minutes of Exercise per Session: Not on file  Stress:   . Feeling of Stress : Not on file  Social Connections:   . Frequency of Communication with Friends and Family: Not on file  . Frequency of Social Gatherings with Friends and Family: Not on file  . Attends Religious Services: Not on file  . Active Member of Clubs or Organizations: Not on file  . Attends Archivist Meetings: Not on file  . Marital Status: Not on file   Allergies  Allergen Reactions  . Penicillins Other (See Comments)    Hands & feet peel. "childhood allergy"   Family History  Problem Relation Age of Onset  . Heart disease  Mother        pacemaker  . Heart disease Father 59       died of MI, 35s  . Diabetes Father   . Stroke Father   . Migraines Sister   . Mental illness Sister   . Cancer Neg Hx   . Hyperlipidemia Neg Hx   . Hypertension Neg Hx   . Colon cancer Neg Hx      Current Outpatient Medications (Cardiovascular):  .  amLODipine (NORVASC) 5 MG tablet, TAKE 1 TABLET BY MOUTH EVERY DAY .  lisinopril-hydrochlorothiazide (ZESTORETIC) 20-12.5 MG tablet, TAKE 1 TABLET BY MOUTH EVERY DAY    Current Outpatient Medications (Hematological):  [name]  Cyanocobalamin (VITAMIN B 12 PO), Take 1 tablet by mouth. .  ferrous sulfate 325 (65 FE) MG tablet, Take 1 tablet (325 mg total) by mouth 2 (two) times daily with a meal. .  folic acid (FOLVITE) 1 MG tablet, Take 1 tablet (1 mg total) by mouth daily.  Current Outpatient Medications (Other):  [name]  B Complex-C (B-COMPLEX WITH VITAMIN C) tablet, Take 1 tablet by mouth daily. .  hydrocortisone (ANUSOL-HC) 2.5 % rectal cream, Place 1 application rectally 2 (two) times daily. .  mebendazole (VERMOX) 100 MG chewable tablet, 1 tablet now, repeat in 3 weeks .  Nutritional Supplements (VITAMIN D BOOSTER PO), Take by mouth. .  phytonadione (VITAMIN K) 5 MG tablet, Take 2 tablets (10 mg total) by mouth daily. .  polyethylene glycol (MIRALAX / GLYCOLAX) packet, Take 17 g by mouth daily as needed. .  Potassium 99 MG TABS, Take 2 tablets by mouth. .  thiamine 100 MG tablet, Take 1 tablet (100 mg total) by mouth daily. .  Vitamin D, Ergocalciferol, (DRISDOL) 50000 units CAPS capsule, TAKE 1 CAPSULE (50,000 UNITS TOTAL) BY MOUTH EVERY 7 (SEVEN) DAYS.   Reviewed prior external information including notes and imaging from  primary care provider As well as notes that were available from care everywhere and other healthcare systems.  Past medical history, social, surgical and family history all reviewed in electronic medical record.  [name] pertanent information unless stated  regarding to the chief complaint.   Review of Systems:  [name] headache, visual changes, nausea, vomiting, diarrhea, constipation, dizziness, abdominal pain, skin rash, fevers, chills, night sweats, weight loss, swollen lymph nodes, body aches, joint swelling, chest pain, shortness of breath, mood changes. POSITIVE muscle aches  Objective  Blood pressure 110/62, pulse (!) 56, height 6\' 2"  (1.88 m), weight 187 lb (84.8 kg), SpO2 97 %.   General: [name] apparent distress alert and oriented x3 mood and affect normal,  dressed appropriately.  HEENT: Pupils equal, extraocular movements intact  Respiratory: Patient's speak in full sentences and does not appear short of breath  Cardiovascular: [name] lower extremity edema, non tender, [name] erythema  Skin: Warm dry intact with [name] signs of infection or rash on extremities or on axial skeleton.  Abdomen: Soft nontender  Neuro: Cranial nerves II through XII are intact, neurovascularly intact in all extremities with 2+ DTRs and 2+ pulses.  Lymph: [name] lymphadenopathy of posterior or anterior cervical chain or axillae bilaterally.  Gait antalgic MSK: Left hip exam shows the patient does have a positive Corky Sox.  Tenderness to palpation over the greater trochanteric area.  Negative straight leg test.  Patient does have some minimal pain in the paraspinal musculature lumbar spine left greater than right.  After verbal consent patient was prepped with alcohol swab and with a 21-gauge 2 inch needle injected into the left greater trochanteric area with a total of 1 cc of 0.5% Marcaine and 1 cc of Kenalog 40 mg/mL [name] blood loss.  Postinjection instructions given  [id number]; 15 additional minutes spent for Therapeutic exercises as stated in above notes.  This included exercises focusing on stretching, strengthening, with significant focus on eccentric aspects.   Long term goals include an improvement in range of motion, strength, endurance as well as avoiding reinjury. Patient's  frequency would include in 1-2 times a day, 3-5 times a week for a duration of 6-12 weeks.  Hip strengthening exercises which included:  Pelvic tilt/bracing to help with proper recruitment of the lower abs and pelvic floor muscles  Glute strengthening to properly contract glutes without over-engaging low back and hamstrings - prone hip extension and glute bridge exercises Proper stretching techniques to increase effectiveness for the hip flexors, groin, quads, piriformic and low back when appropriate   Proper technique shown and discussed handout in great detail with ATC.  All questions were discussed and answered.      Impression and Recommendations:     This case required medical decision making of moderate complexity. The above documentation has been reviewed and is accurate and complete Lyndal Pulley, DO       Note: This dictation was prepared with Dragon dictation along with smaller phrase technology. Any transcriptional errors that result from this process are unintentional.

## 2020-01-06 ENCOUNTER — Ambulatory Visit: Payer: BC Managed Care – PPO | Admitting: Family Medicine

## 2020-01-06 ENCOUNTER — Ambulatory Visit (INDEPENDENT_AMBULATORY_CARE_PROVIDER_SITE_OTHER): Payer: BC Managed Care – PPO

## 2020-01-06 ENCOUNTER — Other Ambulatory Visit: Payer: Self-pay

## 2020-01-06 ENCOUNTER — Encounter: Payer: Self-pay | Admitting: Family Medicine

## 2020-01-06 VITALS — BP 140/82 | HR 79 | Ht 74.0 in | Wt 194.0 lb

## 2020-01-06 DIAGNOSIS — S83242A Other tear of medial meniscus, current injury, left knee, initial encounter: Secondary | ICD-10-CM | POA: Diagnosis not present

## 2020-01-06 DIAGNOSIS — M25552 Pain in left hip: Secondary | ICD-10-CM

## 2020-01-06 DIAGNOSIS — G5702 Lesion of sciatic nerve, left lower limb: Secondary | ICD-10-CM

## 2020-01-06 DIAGNOSIS — M7062 Trochanteric bursitis, left hip: Secondary | ICD-10-CM

## 2020-01-06 NOTE — Patient Instructions (Addendum)
Good to see you Take a rest from the exercises and restart Tennis ball in back pocket and sit Injection today See me again 5 weeks

## 2020-01-06 NOTE — Assessment & Plan Note (Signed)
Will monitor.  Chronic problem.  Will likely need some different ergonomic changes at work if this continues.

## 2020-01-06 NOTE — Progress Notes (Signed)
[facility] [address] [place] Phone: [phone] Subjective:   [name] am serving as a Education administrator for Dr. Hulan Saas.  This visit occurred during the SARS-CoV-2 public health emergency.  Safety protocols were in place, including screening questions prior to the visit, additional usage of staff PPE, and extensive cleaning of exam room while observing appropriate contact time as indicated for disinfecting solutions.   I'm seeing this patient by the request  of:  Denita Lung, MD  CC: Left hip and back pain follow-up  [id number]   12/09/2019 Patient was given injection today, tolerated the procedure well, discussed icing regimen and home exercise, which activities to do which was to avoid.  Patient should do relatively well.  Home exercises given.  Follow-up again in 4 to 6 weeks   Jeffery [name] is a 59 y.o. male coming in with complaint of left hip pain. Patient states he is feeling better. Not 100%. Hasn't been stretching and exercises like he is supposed to. Left glut. Pain. Patient states he can not lay or sit on the left glut. Numbness in the right hand as well.  Patient has noticed more burning left buttocks pain being the main severe pain.  Worse with laying on it at night.  Rates the severity of pain is 5 out of 10.  Feels some of the repetitive activity at work seems to give him difficulty as well.  Patient did have a medial meniscal tear of the left knee.  Was doing better with avoiding certain activities especially different machinery at work.  Has had to start using the machine again.  Patient wants to try for 4 weeks but if worsening pain would like a potential note to keep him from having the discomfort again.    Past Medical History:  Diagnosis Date  . Anemia   . Central serous retinopathy    hx/o intraocular injection therapy for 3 years; no change after 3 years of therapy, no visual c/o  . Depression   .  Former smoker   . History of blood transfusion 04/17/2017   "related to anemia"  . History of kidney stones   . Hypertension   . Left varicocele   . Port-wine stain of face   . Pre-diabetes    Past Surgical History:  Procedure Laterality Date  . COLONOSCOPY     never  . EXTRACORPOREAL SHOCK WAVE LITHOTRIPSY    . PALATE / UVULA BIOPSY / EXCISION  2006   "polyp on uvula cut out"   Social History   Socioeconomic History  . Marital status: Divorced    Spouse name: Not on file  . Number of children: Not on file  . Years of education: Not on file  . Highest education level: Not on file  Occupational History  . Not on file  Tobacco Use  . Smoking status: Former Smoker    Packs/day: 1.00    Years: 15.00    Pack years: 15.00    Types: Cigarettes    Quit date: 12/01/1997    Years since quitting: 22.1  . Smokeless tobacco: Never Used  Substance and Sexual Activity  . Alcohol use: Yes    Comment: Beer.  . Drug use: No  . Sexual activity: Not Currently  Other Topics Concern  . Not on file  Social History Narrative   Divorced 04/2012, 2 children ages daughter 30yo, son 30yo; exercise - moving on the job, walking, works as  Electrical engineer   Social Determinants of Health   Financial Resource Strain:   . Difficulty of Paying Living Expenses:   Food Insecurity:   . Worried About Charity fundraiser in the Last Year:   . Arboriculturist in the Last Year:   Transportation Needs:   . Film/video editor (Medical):   [name] Lack of Transportation (Non-Medical):   Physical Activity:   . Days of Exercise per Week:   . Minutes of Exercise per Session:   Stress:   . Feeling of Stress :   Social Connections:   . Frequency of Communication with Friends and Family:   . Frequency of Social Gatherings with Friends and Family:   . Attends Religious Services:   . Active Member of Clubs or Organizations:   . Attends Archivist Meetings:   [name] Marital Status:    Allergies   Allergen Reactions  . Penicillins Other (See Comments)    Hands & feet peel. "childhood allergy"   Family History  Problem Relation Age of Onset  . Heart disease Mother        pacemaker  . Heart disease Father 63       died of MI, 69s  . Diabetes Father   . Stroke Father   . Migraines Sister   . Mental illness Sister   . Cancer Neg Hx   . Hyperlipidemia Neg Hx   . Hypertension Neg Hx   . Colon cancer Neg Hx      Current Outpatient Medications (Cardiovascular):  .  amLODipine (NORVASC) 5 MG tablet, TAKE 1 TABLET BY MOUTH EVERY DAY .  lisinopril-hydrochlorothiazide (ZESTORETIC) 20-12.5 MG tablet, TAKE 1 TABLET BY MOUTH EVERY DAY    Current Outpatient Medications (Hematological):  [name]  Cyanocobalamin (VITAMIN B 12 PO), Take 1 tablet by mouth. .  ferrous sulfate 325 (65 FE) MG tablet, Take 1 tablet (325 mg total) by mouth 2 (two) times daily with a meal. .  folic acid (FOLVITE) 1 MG tablet, Take 1 tablet (1 mg total) by mouth daily.  Current Outpatient Medications (Other):  [name]  B Complex-C (B-COMPLEX WITH VITAMIN C) tablet, Take 1 tablet by mouth daily. .  hydrocortisone (ANUSOL-HC) 2.5 % rectal cream, Place 1 application rectally 2 (two) times daily. .  mebendazole (VERMOX) 100 MG chewable tablet, 1 tablet now, repeat in 3 weeks .  Nutritional Supplements (VITAMIN D BOOSTER PO), Take by mouth. .  phytonadione (VITAMIN K) 5 MG tablet, Take 2 tablets (10 mg total) by mouth daily. .  polyethylene glycol (MIRALAX / GLYCOLAX) packet, Take 17 g by mouth daily as needed. .  Potassium 99 MG TABS, Take 2 tablets by mouth. .  thiamine 100 MG tablet, Take 1 tablet (100 mg total) by mouth daily. .  Vitamin D, Ergocalciferol, (DRISDOL) 50000 units CAPS capsule, TAKE 1 CAPSULE (50,000 UNITS TOTAL) BY MOUTH EVERY 7 (SEVEN) DAYS.   Reviewed prior external information including notes and imaging from  primary care provider As well as notes that were available from care everywhere and other  healthcare systems.  Past medical history, social, surgical and family history all reviewed in electronic medical record.  No pertanent information unless stated regarding to the chief complaint.   Review of Systems:  No headache, visual changes, nausea, vomiting, diarrhea, constipation, dizziness, abdominal pain, skin rash, fevers, chills, night sweats, weight loss, swollen lymph nodes, body aches, joint swelling, chest pain, shortness of breath, mood changes. POSITIVE muscle aches  Objective  Blood pressure 140/82, pulse 79, height 6\' 2"  (1.88 m), weight 194 lb (88 kg), SpO2 97 %.   General: No apparent distress alert and oriented x3 mood and affect normal, dressed appropriately.  HEENT: Pupils equal, extraocular movements intact  Respiratory: Patient's speak in full sentences and does not appear short of breath  Cardiovascular: No lower extremity edema, non tender, no erythema  Neuro: Cranial nerves II through XII are intact, neurovascularly intact in all extremities with 2+ DTRs and 2+ pulses.  Gait normal with good balance and coordination.  MSK: Mild arthritic changes of multiple joints Left buttocks pain noted with follow-up positive Corky Sox.  Pain over the piriformis noted.  Significant less pain over the greater trochanteric area.  Mild tightness with straight leg test.  Procedure: Real-time Ultrasound Guided Injection of left piriformis tendon sheath Device: GE Logiq Q7 Ultrasound guided injection is preferred based studies that show increased duration, increased effect, greater accuracy, decreased procedural pain, increased response rate, and decreased cost with ultrasound guided versus blind injection.  Verbal informed consent obtained.  Time-out conducted.  Noted no overlying erythema, induration, or other signs of local infection.  Skin prepped in a sterile fashion.  Local anesthesia: Topical Ethyl chloride.  With sterile technique and under real time ultrasound guidance:    Completed without difficulty  Pain immediately resolved suggesting accurate placement of the medication.  Advised to call if fevers/chills, erythema, induration, drainage, or persistent bleeding.  Patient did have bruising noted initially. Images permanently stored and available for review in the ultrasound unit.  Impression: Technically successful ultrasound guided injection.   Impression and Recommendations:     This case required medical decision making of moderate complexity. The above documentation has been reviewed and is accurate and complete Lyndal Pulley, DO       Note: This dictation was prepared with Dragon dictation along with smaller phrase technology. Any transcriptional errors that result from this process are unintentional.

## 2020-01-06 NOTE — Assessment & Plan Note (Signed)
Left piriformis syndrome.  Discussed icing regimen and home exercise, which activities to do which wants to avoid.  Patient is to increase activity slowly over the course of next several weeks.  Discussed icing regimen, topical anti-inflammatories.  Responded well to the injection today and hopefully this will be beneficial.  We discussed certain repetitive motions may cause some more discomfort and pain.  Follow-up again in 4 to 8 weeks

## 2020-01-06 NOTE — Assessment & Plan Note (Signed)
Significant improvement with this but continues to have more of the piriformis.  Questionable need for further work-up of the back if this continues.  Patient likes to avoid a lot of prescription medications if possible patient unable to do formal physical therapy session secondary to having to work multiple different shifts right now at work due to financial constraints.

## 2020-02-14 ENCOUNTER — Encounter: Payer: Self-pay | Admitting: Family Medicine

## 2020-02-14 ENCOUNTER — Other Ambulatory Visit: Payer: Self-pay

## 2020-02-14 ENCOUNTER — Ambulatory Visit: Payer: Self-pay

## 2020-02-14 ENCOUNTER — Ambulatory Visit: Payer: BC Managed Care – PPO | Admitting: Family Medicine

## 2020-02-14 VITALS — BP 140/70 | HR 77 | Ht 74.0 in | Wt 189.0 lb

## 2020-02-14 DIAGNOSIS — G5702 Lesion of sciatic nerve, left lower limb: Secondary | ICD-10-CM

## 2020-02-14 DIAGNOSIS — M25559 Pain in unspecified hip: Secondary | ICD-10-CM | POA: Diagnosis not present

## 2020-02-14 NOTE — Patient Instructions (Addendum)
Good to see you Iron 65 mg with vitamin C 500 mg See me again in 6 weeks or we will get labs if not better

## 2020-02-14 NOTE — Assessment & Plan Note (Signed)
Attempted another injection secondary to continue to have pain the more lateral is time.  I am optimistic that this should make him pretty good improvement.  Discussed icing regimen and home exercises.  Patient was 75% better and on encouraged by this.  Otherwise we will consider the possibility of further evaluation of lumbar radiculopathy.  Patient given a note for work for why he is here with some very mild restrictions.  Follow-up with me again in 4 to 8 weeks

## 2020-02-14 NOTE — Progress Notes (Signed)
[name] [address] [place] Phone: [phone] Subjective:   [name] am serving as a Education administrator for Dr. Hulan Saas.  This visit occurred during the SARS-CoV-2 public health emergency.  Safety protocols were in place, including screening questions prior to the visit, additional usage of staff PPE, and extensive cleaning of exam room while observing appropriate contact time as indicated for disinfecting solutions.   I'm seeing this patient by the request  of:  Jeffery Lung, MD  CC: Left leg and buttocks pain  [id number]   01/06/2020 Significant improvement with this but continues to have more of the piriformis.  Questionable need for further work-up of the back if this continues.  Patient likes to avoid a lot of prescription medications if possible patient unable to do formal physical therapy session secondary to having to work multiple different shifts right now at work due to financial constraints.  Will monitor.  Chronic problem.  Will likely need some different ergonomic changes at work if this continues.  Update 02/14/2020 Jeffery [name] is a 59 y.o. male coming in with complaint of left hip pain. Patient states that the hip and piriformis is doing better. Piriformis worse than hip. Leg cramps that are nightly from the knees down.       Past Medical History:  Diagnosis Date  . Anemia   . Central serous retinopathy    hx/o intraocular injection therapy for 3 years; no change after 3 years of therapy, no visual c/o  . Depression   . Former smoker   . History of blood transfusion 04/17/2017   "related to anemia"  . History of kidney stones   . Hypertension   . Left varicocele   . Port-wine stain of face   . Pre-diabetes    Past Surgical History:  Procedure Laterality Date  . COLONOSCOPY     never  . EXTRACORPOREAL SHOCK WAVE LITHOTRIPSY    . PALATE / UVULA BIOPSY / EXCISION  2006   "polyp on uvula cut  out"   Social History   Socioeconomic History  . Marital status: Divorced    Spouse name: Not on file  . Number of children: Not on file  . Years of education: Not on file  . Highest education level: Not on file  Occupational History  . Not on file  Tobacco Use  . Smoking status: Former Smoker    Packs/day: 1.00    Years: 15.00    Pack years: 15.00    Types: Cigarettes    Quit date: 12/01/1997    Years since quitting: 22.2  . Smokeless tobacco: Never Used  Substance and Sexual Activity  . Alcohol use: Yes    Comment: Beer.  . Drug use: No  . Sexual activity: Not Currently  Other Topics Concern  . Not on file  Social History Narrative   Divorced 04/2012, 2 children ages daughter 22yo, son 1yo; exercise - moving on the job, walking, works as Electrical engineer   Social Determinants of Radio broadcast assistant Strain:   . Difficulty of Paying Living Expenses:   Food Insecurity:   . Worried About Charity fundraiser in the Last Year:   . Arboriculturist in the Last Year:   Transportation Needs:   . Film/video editor (Medical):   [name] Lack of Transportation (Non-Medical):   Physical Activity:   . Days of Exercise per Week:   . Minutes of  Exercise per Session:   Stress:   . Feeling of Stress :   Social Connections:   . Frequency of Communication with Friends and Family:   . Frequency of Social Gatherings with Friends and Family:   . Attends Religious Services:   . Active Member of Clubs or Organizations:   . Attends Archivist Meetings:   [name] Marital Status:    Allergies  Allergen Reactions  . Penicillins Other (See Comments)    Hands & feet peel. "childhood allergy"   Family History  Problem Relation Age of Onset  . Heart disease Mother        pacemaker  . Heart disease Father 42       died of MI, 86s  . Diabetes Father   . Stroke Father   . Migraines Sister   . Mental illness Sister   . Cancer Neg Hx   . Hyperlipidemia Neg Hx   .  Hypertension Neg Hx   . Colon cancer Neg Hx      Current Outpatient Medications (Cardiovascular):  .  amLODipine (NORVASC) 5 MG tablet, TAKE 1 TABLET BY MOUTH EVERY DAY .  lisinopril-hydrochlorothiazide (ZESTORETIC) 20-12.5 MG tablet, TAKE 1 TABLET BY MOUTH EVERY DAY    Current Outpatient Medications (Hematological):  [name]  Cyanocobalamin (VITAMIN B 12 PO), Take 1 tablet by mouth. .  ferrous sulfate 325 (65 FE) MG tablet, Take 1 tablet (325 mg total) by mouth 2 (two) times daily with a meal. .  folic acid (FOLVITE) 1 MG tablet, Take 1 tablet (1 mg total) by mouth daily.  Current Outpatient Medications (Other):  [name]  B Complex-C (B-COMPLEX WITH VITAMIN C) tablet, Take 1 tablet by mouth daily. .  mebendazole (VERMOX) 100 MG chewable tablet, 1 tablet now, repeat in 3 weeks .  Nutritional Supplements (VITAMIN D BOOSTER PO), Take by mouth. .  phytonadione (VITAMIN K) 5 MG tablet, Take 2 tablets (10 mg total) by mouth daily. .  polyethylene glycol (MIRALAX / GLYCOLAX) packet, Take 17 g by mouth daily as needed. .  Potassium 99 MG TABS, Take 2 tablets by mouth. .  thiamine 100 MG tablet, Take 1 tablet (100 mg total) by mouth daily. .  Vitamin D, Ergocalciferol, (DRISDOL) 50000 units CAPS capsule, TAKE 1 CAPSULE (50,000 UNITS TOTAL) BY MOUTH EVERY 7 (SEVEN) DAYS.   Reviewed prior external information including notes and imaging from  primary care provider As well as notes that were available from care everywhere and other healthcare systems.  Past medical history, social, surgical and family history all reviewed in electronic medical record.  No pertanent information unless stated regarding to the chief complaint.   Review of Systems:  No headache, visual changes, nausea, vomiting, diarrhea, constipation, dizziness, abdominal pain, skin rash, fevers, chills, night sweats, weight loss, swollen lymph nodes, body aches, joint swelling, chest pain, shortness of breath, mood changes. POSITIVE muscle  aches  Objective  Blood pressure 140/70, pulse 77, height 6\' 2"  (1.88 m), weight 189 lb (85.7 kg), SpO2 98 %.   General: No apparent distress alert and oriented x3 mood and affect normal, dressed appropriately.  HEENT: Pupils equal, extraocular movements intact  Respiratory: Patient's speak in full sentences and does not appear short of breath  Cardiovascular: No lower extremity edema, non tender, no erythema  Neuro: Cranial nerves II through XII are intact, neurovascularly intact in all extremities with 2+ DTRs and 2+ pulses.  Gait normal with good balance and coordination.  MSK:  Non tender with  full range of motion and good stability and symmetric strength and tone of shoulders, elbows, wrist, hip, knee and ankles bilaterally.  Left knee exam still shows the patient does have a very mild positive McMurray's noted.  Patient has a positive FABER test.  Minimal pain over the greater trochanteric area.  Negative straight leg test back exam very minimal tenderness noted in the paraspinal musculature of the parent of the lumbar spine  Procedure: Real-time Ultrasound Guided Injection of left piriformis tendon sheath Device: GE Logiq Q7 Ultrasound guided injection is preferred based studies that show increased duration, increased effect, greater accuracy, decreased procedural pain, increased response rate, and decreased cost with ultrasound guided versus blind injection.  Verbal informed consent obtained.  Time-out conducted.  Noted no overlying erythema, induration, or other signs of local infection.  Skin prepped in a sterile fashion.  Local anesthesia: Topical Ethyl chloride.  With sterile technique and under real time ultrasound guidance: With a 21-gauge 3 inch needle injected with 0.5 cc of 0.5% Marcaine and 0.5 cc of Kenalog 40 mg/mL Completed without difficulty  Pain immediately resolved suggesting accurate placement of the medication.  Advised to call if fevers/chills, erythema,  induration, drainage, or persistent bleeding.  Images permanently stored and available for review in the ultrasound unit.  Impression: Technically successful ultrasound guided injection.   Impression and Recommendations:     This case required medical decision making of moderate complexity. The above documentation has been reviewed and is accurate and complete Lyndal Pulley, DO       Note: This dictation was prepared with Dragon dictation along with smaller phrase technology. Any transcriptional errors that result from this process are unintentional.

## 2020-02-27 ENCOUNTER — Telehealth: Payer: Self-pay

## 2020-02-27 NOTE — Telephone Encounter (Signed)
Received a fax from [facility] on [address] for a refill on the pts. Lisinopril/hctz and amlodipine pt. Last apt was 04/23/19.

## 2020-02-28 ENCOUNTER — Other Ambulatory Visit: Payer: Self-pay

## 2020-02-28 DIAGNOSIS — I10 Essential (primary) hypertension: Secondary | ICD-10-CM

## 2020-02-28 MED ORDER — LISINOPRIL-HYDROCHLOROTHIAZIDE 20-12.5 MG PO TABS: 1.0000 | ORAL_TABLET | Freq: Every day | ORAL | 0 refills | Status: DC

## 2020-02-28 NOTE — Telephone Encounter (Signed)
Called pt to advise he needs an appointment with Dr. Redmond School. [place]

## 2020-02-28 NOTE — Telephone Encounter (Signed)
Done KH 

## 2020-03-03 ENCOUNTER — Other Ambulatory Visit: Payer: Self-pay | Admitting: Family Medicine

## 2020-03-03 DIAGNOSIS — I10 Essential (primary) hypertension: Secondary | ICD-10-CM

## 2020-03-27 ENCOUNTER — Encounter: Payer: Self-pay | Admitting: Family Medicine

## 2020-03-27 ENCOUNTER — Ambulatory Visit: Payer: BC Managed Care – PPO | Admitting: Family Medicine

## 2020-03-27 ENCOUNTER — Other Ambulatory Visit: Payer: Self-pay

## 2020-03-27 DIAGNOSIS — S76302A Unspecified injury of muscle, fascia and tendon of the posterior muscle group at thigh level, left thigh, initial encounter: Secondary | ICD-10-CM | POA: Diagnosis not present

## 2020-03-27 DIAGNOSIS — L600 Ingrowing nail: Secondary | ICD-10-CM | POA: Insufficient documentation

## 2020-03-27 NOTE — Progress Notes (Signed)
[facility] [address] [place] Phone: [phone] Subjective:   I Kandace Blitz am serving as a Education administrator for Dr. Hulan Saas.  This visit occurred during the SARS-CoV-2 public health emergency.  Safety protocols were in place, including screening questions prior to the visit, additional usage of staff PPE, and extensive cleaning of exam room while observing appropriate contact time as indicated for disinfecting solutions.   I'm seeing this patient by the request  of:  Denita Lung, MD  CC: Low back pain, buttocks pain  [id number]   02/15/2020 Attempted another injection secondary to continue to have pain the more lateral is time.  I am optimistic that this should make him pretty good improvement.  Discussed icing regimen and home exercises.  Patient was 75% better and on encouraged by this.  Otherwise we will consider the possibility of further evaluation of lumbar radiculopathy.  Patient given a note for work for why he is here with some very mild restrictions.  Follow-up with me again in 4 to 8 weeks  03/27/2020 RIESE HELLARD is a 59 y.o. male coming in with complaint of left hip pain. Patient states he is a little better. Glut is painful to sit on. Still some pain in the hip. States his legs and feet are doing much better. Has a toe nail that is "curved" and is cutting into the toe. Wants to know if that is the cause of all his other issues. Patient was found previously to have a greater trochanteric bursitis and had an injection and states that the pain on the lateral aspect of the hip is almost completely resolved and now only in the buttocks region seems to be a. Very minimal discomfort in the back itself      Past Medical History:  Diagnosis Date  . Anemia   . Central serous retinopathy    hx/o intraocular injection therapy for 3 years; no change after 3 years of therapy, no visual c/o  . Depression   . Former smoker     . History of blood transfusion 04/17/2017   "related to anemia"  . History of kidney stones   . Hypertension   . Left varicocele   . Port-wine stain of face   . Pre-diabetes    Past Surgical History:  Procedure Laterality Date  . COLONOSCOPY     never  . EXTRACORPOREAL SHOCK WAVE LITHOTRIPSY    . PALATE / UVULA BIOPSY / EXCISION  2006   "polyp on uvula cut out"   Social History   Socioeconomic History  . Marital status: Divorced    Spouse name: Not on file  . Number of children: Not on file  . Years of education: Not on file  . Highest education level: Not on file  Occupational History  . Not on file  Tobacco Use  . Smoking status: Former Smoker    Packs/day: 1.00    Years: 15.00    Pack years: 15.00    Types: Cigarettes    Quit date: 12/01/1997    Years since quitting: 22.3  . Smokeless tobacco: Never Used  Vaping Use  . Vaping Use: Never used  Substance and Sexual Activity  . Alcohol use: Yes    Comment: Beer.  . Drug use: No  . Sexual activity: Not Currently  Other Topics Concern  . Not on file  Social History Narrative   Divorced 04/2012, 2 children ages daughter 56yo, son 11yo; exercise -  moving on the job, walking, works as Electrical engineer   Social Determinants of Radio broadcast assistant Strain:   . Difficulty of Paying Living Expenses:   Food Insecurity:   . Worried About Charity fundraiser in the Last Year:   . Arboriculturist in the Last Year:   Transportation Needs:   . Film/video editor (Medical):   [name] Lack of Transportation (Non-Medical):   Physical Activity:   . Days of Exercise per Week:   . Minutes of Exercise per Session:   Stress:   . Feeling of Stress :   Social Connections:   . Frequency of Communication with Friends and Family:   . Frequency of Social Gatherings with Friends and Family:   . Attends Religious Services:   . Active Member of Clubs or Organizations:   . Attends Archivist Meetings:   [name] Marital  Status:    Allergies  Allergen Reactions  . Penicillins Other (See Comments)    Hands & feet peel. "childhood allergy"   Family History  Problem Relation Age of Onset  . Heart disease Mother        pacemaker  . Heart disease Father 2       died of MI, 51s  . Diabetes Father   . Stroke Father   . Migraines Sister   . Mental illness Sister   . Cancer Neg Hx   . Hyperlipidemia Neg Hx   . Hypertension Neg Hx   . Colon cancer Neg Hx      Current Outpatient Medications (Cardiovascular):  .  amLODipine (NORVASC) 5 MG tablet, TAKE 1 TABLET BY MOUTH EVERY DAY .  lisinopril-hydrochlorothiazide (ZESTORETIC) 20-12.5 MG tablet, Take 1 tablet by mouth daily.    Current Outpatient Medications (Hematological):  [name]  Cyanocobalamin (VITAMIN B 12 PO), Take 1 tablet by mouth. .  ferrous sulfate 325 (65 FE) MG tablet, Take 1 tablet (325 mg total) by mouth 2 (two) times daily with a meal. .  folic acid (FOLVITE) 1 MG tablet, Take 1 tablet (1 mg total) by mouth daily.  Current Outpatient Medications (Other):  [name]  B Complex-C (B-COMPLEX WITH VITAMIN C) tablet, Take 1 tablet by mouth daily. .  mebendazole (VERMOX) 100 MG chewable tablet, 1 tablet now, repeat in 3 weeks .  Nutritional Supplements (VITAMIN D BOOSTER PO), Take by mouth. .  phytonadione (VITAMIN K) 5 MG tablet, Take 2 tablets (10 mg total) by mouth daily. .  polyethylene glycol (MIRALAX / GLYCOLAX) packet, Take 17 g by mouth daily as needed. .  Potassium 99 MG TABS, Take 2 tablets by mouth. .  thiamine 100 MG tablet, Take 1 tablet (100 mg total) by mouth daily. .  Vitamin D, Ergocalciferol, (DRISDOL) 50000 units CAPS capsule, TAKE 1 CAPSULE (50,000 UNITS TOTAL) BY MOUTH EVERY 7 (SEVEN) DAYS.   Reviewed prior external information including notes and imaging from  primary care provider As well as notes that were available from care everywhere and other healthcare systems.  Past medical history, social, surgical and family history  all reviewed in electronic medical record.  No pertanent information unless stated regarding to the chief complaint.   Review of Systems:  No headache, visual changes, nausea, vomiting, diarrhea, constipation, dizziness, abdominal pain, skin rash, fevers, chills, night sweats, weight loss, swollen lymph nodes, body aches, joint swelling, chest pain, shortness of breath, mood changes. POSITIVE muscle aches  Objective  Blood pressure 136/82, pulse 73, height 6\' 2"  (1.88  m), weight 187 lb (84.8 kg), SpO2 98 %.   General: No apparent distress alert and oriented x3 mood and affect normal, dressed appropriately.  HEENT: Pupils equal, extraocular movements intact  Respiratory: Patient's speak in full sentences and does not appear short of breath  Cardiovascular: No lower extremity edema, non tender, no erythema  Neuro: Cranial nerves II through XII are intact, neurovascularly intact in all extremities with 2+ DTRs and 2+ pulses.  Gait normal with good balance and coordination.  MSK: Patient's left buttock still seems to be tender to palpation. Positive straight leg test tomorrow secondary to tightness in the hamstrings and true radicular symptoms. Minimal discomfort in the lower back. Mild pain with FABER test. Minimal to no tenderness over the greater trochanteric area. 5-5 strength of the lower extremities and deep tendon reflexes intact   Foot exam shows the patient does have some mild splaying between the first and second toes. Patient does have some curving of the lateral aspects of the toenails bilaterally. No erythema noted. Patient is minimally tender to palpation over the fibular side of the large toe. Impression and Recommendations:     The above documentation has been reviewed and is accurate and complete Lyndal Pulley, DO       Note: This dictation was prepared with Dragon dictation along with smaller phrase technology. Any transcriptional errors that result from this process are  unintentional.

## 2020-03-27 NOTE — Assessment & Plan Note (Signed)
Difficult to assess patient's problem at this time. Patient has what appears to be some mild piriformis, gluteal tendon, possible lumbar radiculopathy as well as hamstring tendinitis. At this point we have not treated the hamstring as appropriately. Discussed thigh compression sleeve, icing regimen, topical anti-inflammatories, worsening symptoms will consider the possibility of injection in the hamstring likely patient's other problems he seems to be improving significantly.

## 2020-03-27 NOTE — Patient Instructions (Addendum)
Good to see you Wear thigh compression sleeve daily for two weeks then with exercise only If not feeling better will inject hamstring tendon See again in 6 weeks

## 2020-03-27 NOTE — Assessment & Plan Note (Signed)
Ingrown toenail that does not appear to be infection. Patient has had surgical intervention on the contralateral side previously and may need a lateral margin excision. Patient will be referred to podiatry.

## 2020-05-08 ENCOUNTER — Ambulatory Visit: Payer: BC Managed Care – PPO | Admitting: Family Medicine

## 2020-05-18 ENCOUNTER — Encounter: Payer: Self-pay | Admitting: Family Medicine

## 2020-05-18 ENCOUNTER — Ambulatory Visit (INDEPENDENT_AMBULATORY_CARE_PROVIDER_SITE_OTHER): Payer: BC Managed Care – PPO | Admitting: Family Medicine

## 2020-05-18 VITALS — BP 110/68 | HR 91 | Temp 97.9°F | Ht 75.0 in | Wt 181.2 lb

## 2020-05-18 DIAGNOSIS — I10 Essential (primary) hypertension: Secondary | ICD-10-CM

## 2020-05-18 DIAGNOSIS — M545 Low back pain: Secondary | ICD-10-CM

## 2020-05-18 DIAGNOSIS — F325 Major depressive disorder, single episode, in full remission: Secondary | ICD-10-CM | POA: Diagnosis not present

## 2020-05-18 DIAGNOSIS — Z0000 Encounter for general adult medical examination without abnormal findings: Secondary | ICD-10-CM | POA: Diagnosis not present

## 2020-05-18 DIAGNOSIS — G8929 Other chronic pain: Secondary | ICD-10-CM

## 2020-05-18 MED ORDER — LISINOPRIL-HYDROCHLOROTHIAZIDE 20-12.5 MG PO TABS: 1.0000 | ORAL_TABLET | Freq: Every day | ORAL | 3 refills | Status: DC

## 2020-05-18 MED ORDER — AMLODIPINE BESYLATE 5 MG PO TABS: 5.0000 mg | ORAL_TABLET | Freq: Every day | ORAL | 3 refills | Status: DC

## 2020-05-18 NOTE — Progress Notes (Signed)
   Subjective:    Patient ID: Jeffery [name], male    DOB: 01/28/1961, 59 y.o.   MRN: [id number]  HPI He is here for complete examination.  He has been seeing Jeffery [name] for various aches and pains in his back and hip area.  He feels as if all this is basically because he has a very physically demanding job and feels overworked.  He does have retinopathy and is followed on a yearly basis for that.  He quit smoking in March 1999.  He does have a previous history of stones.  He also has a history of depression but psychologically feels that he is doing quite nicely.  He continues on lisinopril/HCTZ as well as amlodipine.  Presently he is not dating anyone.  Family and social history as well as health maintenance and immunizations was reviewed.   Review of Systems  All other systems reviewed and are negative.      Objective:   Physical Exam Alert and in no distress. Tympanic membranes and canals are normal. Pharyngeal area is normal. Neck is supple without adenopathy or thyromegaly. Cardiac exam shows a regular sinus rhythm without murmurs or gallops. Lungs are clear to auscultation.       Assessment & Plan:  Routine general medical examination at a health care facility - Plan: Comprehensive metabolic panel, CBC with Differential/Platelet, Lipid panel, VITAMIN D 25 Hydroxy (Vit-D Deficiency, Fractures)  Essential hypertension, benign - Plan: amLODipine (NORVASC) 5 MG tablet, lisinopril-hydrochlorothiazide (ZESTORETIC) 20-12.5 MG tablet  Major depression in remission ([place])  Chronic right-sided low back pain without sciatica Recommend he continue on his present medication regimen.  At the end of the encounter he then asked for 3 days off from work because of feeling overworked.  I explained that I could not in good conscience do that since there is no good reason for giving him time off from work.

## 2020-05-19 LAB — CBC WITH DIFFERENTIAL/PLATELET
Basophils Absolute: 0 10*3/uL (ref 0.0–0.2)
Basos: 1 %
EOS (ABSOLUTE): 0.3 10*3/uL (ref 0.0–0.4)
Eos: 5 %
Hematocrit: 44.7 % (ref 37.5–51.0)
Hemoglobin: 15.4 g/dL (ref 13.0–17.7)
Immature Grans (Abs): 0 10*3/uL (ref 0.0–0.1)
Immature Granulocytes: 0 %
Lymphocytes Absolute: 1.1 10*3/uL (ref 0.7–3.1)
Lymphs: 18 %
MCH: 32.3 pg (ref 26.6–33.0)
MCHC: 34.5 g/dL (ref 31.5–35.7)
MCV: 94 fL (ref 79–97)
Monocytes Absolute: 0.8 10*3/uL (ref 0.1–0.9)
Monocytes: 13 %
Neutrophils Absolute: 3.8 10*3/uL (ref 1.4–7.0)
Neutrophils: 63 %
Platelets: 283 10*3/uL (ref 150–450)
RBC: 4.77 x10E6/uL (ref 4.14–5.80)
RDW: 11.6 % (ref 11.6–15.4)
WBC: 6.1 10*3/uL (ref 3.4–10.8)

## 2020-05-19 LAB — COMPREHENSIVE METABOLIC PANEL
ALT: 16 IU/L (ref 0–44)
AST: 15 IU/L (ref 0–40)
Albumin/Globulin Ratio: 2.1 (ref 1.2–2.2)
Albumin: 4.9 g/dL (ref 3.8–4.9)
Alkaline Phosphatase: 83 IU/L (ref 48–121)
BUN/Creatinine Ratio: 13 (ref 9–20)
BUN: 12 mg/dL (ref 6–24)
Bilirubin Total: 0.6 mg/dL (ref 0.0–1.2)
CO2: 25 mmol/L (ref 20–29)
Calcium: 10.2 mg/dL (ref 8.7–10.2)
Chloride: 100 mmol/L (ref 96–106)
Creatinine, Ser: 0.93 mg/dL (ref 0.76–1.27)
GFR calc Af Amer: 105 mL/min/{1.73_m2} (ref >59)
GFR calc non Af Amer: 91 mL/min/{1.73_m2} (ref >59)
Globulin, Total: 2.3 g/dL (ref 1.5–4.5)
Glucose: 106 mg/dL — ABNORMAL HIGH (ref 65–99)
Potassium: 4.5 mmol/L (ref 3.5–5.2)
Sodium: 140 mmol/L (ref 134–144)
Total Protein: 7.2 g/dL (ref 6.0–8.5)

## 2020-05-19 LAB — VITAMIN D 25 HYDROXY (VIT D DEFICIENCY, FRACTURES): Vit D, 25-Hydroxy: 36.2 ng/mL (ref 30.0–100.0)

## 2020-05-19 LAB — LIPID PANEL
Chol/HDL Ratio: 2.6 ratio (ref 0.0–5.0)
Cholesterol, Total: 178 mg/dL (ref 100–199)
HDL: 68 mg/dL (ref >39)
LDL Chol Calc (NIH): 97 mg/dL (ref 0–99)
Triglycerides: 66 mg/dL (ref 0–149)
VLDL Cholesterol Cal: 13 mg/dL (ref 5–40)

## 2020-05-27 LAB — SPECIMEN STATUS REPORT

## 2020-05-27 LAB — HGB A1C W/O EAG: Hgb A1c MFr Bld: 5.8 % — ABNORMAL HIGH (ref 4.8–5.6)

## 2020-06-22 ENCOUNTER — Encounter: Payer: Self-pay | Admitting: Family Medicine

## 2020-06-22 ENCOUNTER — Other Ambulatory Visit: Payer: Self-pay

## 2020-06-22 ENCOUNTER — Ambulatory Visit: Payer: BC Managed Care – PPO | Admitting: Family Medicine

## 2020-06-22 DIAGNOSIS — M7712 Lateral epicondylitis, left elbow: Secondary | ICD-10-CM | POA: Diagnosis not present

## 2020-06-22 DIAGNOSIS — S76302D Unspecified injury of muscle, fascia and tendon of the posterior muscle group at thigh level, left thigh, subsequent encounter: Secondary | ICD-10-CM

## 2020-06-22 NOTE — Assessment & Plan Note (Signed)
Lateral epicondylitis.  Discussed which activities to do which wants to avoid.  Increase activity slowly over the course the next several weeks.  Discussed icing regimen and home exercises.  Discussed avoiding repetitive activity and given some restrictions at work at the moment.  Patient will do no lifting above chest height and avoid repetitive wrist extension.  Wrist brace given and patient work with Product/process development scientist.  Follow-up again in 4 to 8 weeks

## 2020-06-22 NOTE — Patient Instructions (Addendum)
Good to see you Lateral epi exercises and wrist brace  See me again in 6 weeks

## 2020-06-22 NOTE — Progress Notes (Signed)
[facility] [address] [place] Phone: [phone] Subjective:   I Kandace Blitz am serving as a Education administrator for Dr. Hulan Saas.  This visit occurred during the SARS-CoV-2 public health emergency.  Safety protocols were in place, including screening questions prior to the visit, additional usage of staff PPE, and extensive cleaning of exam room while observing appropriate contact time as indicated for disinfecting solutions.   I'm seeing this patient by the request  of:  Denita Lung, MD  CC: Buttocks pain left side, new onset left elbow pain  [id number]   03/27/2020 Ingrown toenail that does not appear to be infection. Patient has had surgical intervention on the contralateral side previously and may need a lateral margin excision. Patient will be referred to podiatry.  Difficult to assess patient's problem at this time. Patient has what appears to be some mild piriformis, gluteal tendon, possible lumbar radiculopathy as well as hamstring tendinitis. At this point we have not treated the hamstring as appropriately. Discussed thigh compression sleeve, icing regimen, topical anti-inflammatories, worsening symptoms will consider the possibility of injection in the hamstring likely patient's other problems he seems to be improving significantly.  Update 06/22/2020 JACOBUS COLVIN is a 59 y.o. male coming in with complaint of left hamstring injury. Patient states that he is still having weakness in the left knee and still can't sit comfortably. Left elbow pain today. States he is over worked. Lateral. Some weakness. Patient states he has pain in multiple areas due to repetitious work. States he has some questions today.  Reviewing patient's charts patient has had labs as well showing a mild elevation in A1c which has been fairly stable for the last 2 years.     Past Medical History:  Diagnosis Date  . Anemia   . Central serous  retinopathy    hx/o intraocular injection therapy for 3 years; no change after 3 years of therapy, no visual c/o  . Depression   . Former smoker   . History of blood transfusion 04/17/2017   "related to anemia"  . History of kidney stones   . Hypertension   . Left varicocele   . Port-wine stain of face   . Pre-diabetes    Past Surgical History:  Procedure Laterality Date  . COLONOSCOPY     never  . EXTRACORPOREAL SHOCK WAVE LITHOTRIPSY    . PALATE / UVULA BIOPSY / EXCISION  2006   "polyp on uvula cut out"   Social History   Socioeconomic History  . Marital status: Divorced    Spouse name: Not on file  . Number of children: Not on file  . Years of education: Not on file  . Highest education level: Not on file  Occupational History  . Not on file  Tobacco Use  . Smoking status: Former Smoker    Packs/day: 1.00    Years: 15.00    Pack years: 15.00    Types: Cigarettes    Quit date: 12/01/1997    Years since quitting: 22.5  . Smokeless tobacco: Never Used  Vaping Use  . Vaping Use: Never used  Substance and Sexual Activity  . Alcohol use: Yes    Comment: Beer.  . Drug use: No  . Sexual activity: Not Currently  Other Topics Concern  . Not on file  Social History Narrative   Divorced 04/2012, 2 children ages daughter 35yo, son 49yo; exercise - moving on the job, walking, works as Electrical engineer  Social Determinants of Health   Financial Resource Strain:   . Difficulty of Paying Living Expenses: Not on file  Food Insecurity:   . Worried About Charity fundraiser in the Last Year: Not on file  . Ran Out of Food in the Last Year: Not on file  Transportation Needs:   . Lack of Transportation (Medical): Not on file  . Lack of Transportation (Non-Medical): Not on file  Physical Activity:   . Days of Exercise per Week: Not on file  . Minutes of Exercise per Session: Not on file  Stress:   . Feeling of Stress : Not on file  Social Connections:   . Frequency of  Communication with Friends and Family: Not on file  . Frequency of Social Gatherings with Friends and Family: Not on file  . Attends Religious Services: Not on file  . Active Member of Clubs or Organizations: Not on file  . Attends Archivist Meetings: Not on file  . Marital Status: Not on file   Allergies  Allergen Reactions  . Penicillins Other (See Comments)    Hands & feet peel. "childhood allergy"   Family History  Problem Relation Age of Onset  . Heart disease Mother        pacemaker  . Heart disease Father 30       died of MI, 37s  . Diabetes Father   . Stroke Father   . Migraines Sister   . Mental illness Sister   . Cancer Neg Hx   . Hyperlipidemia Neg Hx   . Hypertension Neg Hx   . Colon cancer Neg Hx      Current Outpatient Medications (Cardiovascular):  .  amLODipine (NORVASC) 5 MG tablet, Take 1 tablet (5 mg total) by mouth daily. [name]  lisinopril-hydrochlorothiazide (ZESTORETIC) 20-12.5 MG tablet, Take 1 tablet by mouth daily.    Current Outpatient Medications (Hematological):  [name]  Cyanocobalamin (VITAMIN B 12 PO), Take 1 tablet by mouth.  .  ferrous sulfate 325 (65 FE) MG tablet, Take 1 tablet (325 mg total) by mouth 2 (two) times daily with a meal. .  folic acid (FOLVITE) 1 MG tablet, Take 1 tablet (1 mg total) by mouth daily.  Current Outpatient Medications (Other):  [name]  B Complex-C (B-COMPLEX WITH VITAMIN C) tablet, Take 1 tablet by mouth daily. .  mebendazole (VERMOX) 100 MG chewable tablet, 1 tablet now, repeat in 3 weeks .  Nutritional Supplements (VITAMIN D BOOSTER PO), Take by mouth.  .  phytonadione (VITAMIN K) 5 MG tablet, Take 2 tablets (10 mg total) by mouth daily. .  polyethylene glycol (MIRALAX / GLYCOLAX) packet, Take 17 g by mouth daily as needed. .  Potassium 99 MG TABS, Take 2 tablets by mouth. .  thiamine 100 MG tablet, Take 1 tablet (100 mg total) by mouth daily. .  Vitamin D, Ergocalciferol, (DRISDOL) 50000 units CAPS capsule,  TAKE 1 CAPSULE (50,000 UNITS TOTAL) BY MOUTH EVERY 7 (SEVEN) DAYS.   Reviewed prior external information including notes and imaging from  primary care provider As well as notes that were available from care everywhere and other healthcare systems.  Past medical history, social, surgical and family history all reviewed in electronic medical record.  No pertanent information unless stated regarding to the chief complaint.   Review of Systems:  No headache, visual changes, nausea, vomiting, diarrhea, constipation, dizziness, abdominal pain, skin rash, fevers, chills, night sweats, weight loss, swollen lymph nodes, body aches, joint swelling, chest  pain, shortness of breath, mood changes. POSITIVE muscle aches  Objective  Blood pressure 110/80, pulse 73, height 6\' 3"  (1.905 m), weight 186 lb (84.4 kg), SpO2 98 %.   General: No apparent distress alert and oriented x3 mood and affect normal, dressed appropriately.  HEENT: Pupils equal, extraocular movements intact  Respiratory: Patient's speak in full sentences and does not appear short of breath  Cardiovascular: No lower extremity edema, non tender, no erythema  Neuro: Cranial nerves II through XII are intact, neurovascularly intact in all extremities with 2+ DTRs and 2+ pulses.  Gait normal with good balance and coordination.  MSK: Left leg still has some tenderness to palpation of the piriformis and the origin of the hamstring on the left side.  Mild pain with resisted flexion of the knee in the buttocks area but not as much at the knee itself.  Neurovascularly intact distally patient does state some mild numbness of the left toes. Left elbow does have tenderness to palpation over the lateral epicondylar region with pain with resisted wrist extension.   [id number]; 15 additional minutes spent for Therapeutic exercises as stated in above notes.  This included exercises focusing on stretching, strengthening, with significant focus on eccentric  aspects.   Long term goals include an improvement in range of motion, strength, endurance as well as avoiding reinjury. Patient's frequency would include in 1-2 times a day, 3-5 times a week for a duration of 6-12 weeks. Elbow anatomy was reviewed, and tendinopathy was explained.  Pt. given a home rehab program. Start with isometrics and ROM, then a series of concentric and eccentric exercises should be done starting with no weight, work up to 1 lb, hammer, etc.  Use counterforce strap if working or using hands.  Formal PT would be beneficial. Emphasized stretching an cross-friction massage Emphasized proper palms up lifting biomechanics to unload ECRB  Proper technique shown and discussed handout in great detail with ATC.  All questions were discussed and answered.     Impression and Recommendations:     The above documentation has been reviewed and is accurate and complete Lyndal Pulley, DO       Note: This dictation was prepared with Dragon dictation along with smaller phrase technology. Any transcriptional errors that result from this process are unintentional.

## 2020-06-22 NOTE — Assessment & Plan Note (Signed)
Patient does have more of a hamstring injury.  In addition to this I do believe the patient has had more of a piriformis syndrome.  The greater trochanteric area has had difficulty as well.  May need further work-up of patient's back and hip with MRI if this continues but I am hoping that with patient having some restrictions at work we may get patient to be better.  Has been working a significant amount and likely contributing to some of the swelling and healing.

## 2020-06-29 ENCOUNTER — Telehealth: Payer: Self-pay | Admitting: Family Medicine

## 2020-06-29 NOTE — Telephone Encounter (Signed)
Patient called stating that his work is not able to accommodate the work restrictions he was given so they have put him out of work on [employer]. He will be bringing by paperwork to be completed tomorrow.   Just FYI.

## 2020-06-30 NOTE — Telephone Encounter (Signed)
Left patient message that paperwork has been faxed.

## 2020-06-30 NOTE — Telephone Encounter (Signed)
Spoke with patient and sent him hard copy of paperwork.

## 2020-06-30 NOTE — Telephone Encounter (Signed)
Paperwork is in [employer] box.  Please called patient when completed and faxed.

## 2020-08-03 ENCOUNTER — Telehealth: Payer: Self-pay | Admitting: Family Medicine

## 2020-08-03 ENCOUNTER — Ambulatory Visit: Payer: BC Managed Care – PPO | Admitting: Family Medicine

## 2020-08-03 ENCOUNTER — Ambulatory Visit: Payer: Self-pay

## 2020-08-03 ENCOUNTER — Encounter: Payer: Self-pay | Admitting: Family Medicine

## 2020-08-03 ENCOUNTER — Other Ambulatory Visit: Payer: Self-pay

## 2020-08-03 VITALS — BP 102/64 | HR 41 | Ht 75.0 in | Wt 187.0 lb

## 2020-08-03 DIAGNOSIS — S76302D Unspecified injury of muscle, fascia and tendon of the posterior muscle group at thigh level, left thigh, subsequent encounter: Secondary | ICD-10-CM | POA: Diagnosis not present

## 2020-08-03 DIAGNOSIS — M25522 Pain in left elbow: Secondary | ICD-10-CM

## 2020-08-03 DIAGNOSIS — M25562 Pain in left knee: Secondary | ICD-10-CM | POA: Insufficient documentation

## 2020-08-03 DIAGNOSIS — G8929 Other chronic pain: Secondary | ICD-10-CM

## 2020-08-03 DIAGNOSIS — M7712 Lateral epicondylitis, left elbow: Secondary | ICD-10-CM | POA: Diagnosis not present

## 2020-08-03 NOTE — Progress Notes (Signed)
[place] Phone: [phone] Subjective:   [name], am serving as a scribe for Dr. Hulan Saas. This visit occurred during the SARS-CoV-2 public health emergency.  Safety protocols were in place, including screening questions prior to the visit, additional usage of staff PPE, and extensive cleaning of exam room while observing appropriate contact time as indicated for disinfecting solutions.   I'm seeing this patient by the request  of:  Denita Lung, MD  CC: Left elbow pain, left leg pain follow-up  [id number]   06/22/2020 Lateral epicondylitis.  Discussed which activities to do which wants to avoid.  Increase activity slowly over the course the next several weeks.  Discussed icing regimen and home exercises.  Discussed avoiding repetitive activity and given some restrictions at work at the moment.  Patient will do no lifting above chest height and avoid repetitive wrist extension.  Wrist brace given and patient work with Product/process development scientist.  Follow-up again in 4 to 8 weeks  Patient does have more of a hamstring injury.  In addition to this I do believe the patient has had more of a piriformis syndrome.  The greater trochanteric area has had difficulty as well.  May need further work-up of patient's back and hip with MRI if this continues but I am hoping that with patient having some restrictions at work we may get patient to be better.  Has been working a significant amount and likely contributing to some of the swelling and healing.   Update 08/03/2020 SANAV REMER is a 59 y.o. male coming in with complaint of left elbow and left hamstring injury. Patient states that overall his pain has improved in both areas as he has been out of work since they could not accommodate him. Patient does feel some pain in lateral epicondyle with gripping. Patient does feel pain in left glute when lying in bed and  sitting. Patient has not been doing stretches but wants to now that everything has calmed down.       Past Medical History:  Diagnosis Date  . Anemia   . Central serous retinopathy    hx/o intraocular injection therapy for 3 years; no change after 3 years of therapy, no visual c/o  . Depression   . Former smoker   . History of blood transfusion 04/17/2017   "related to anemia"  . History of kidney stones   . Hypertension   . Left varicocele   . Port-wine stain of face   . Pre-diabetes    Past Surgical History:  Procedure Laterality Date  . COLONOSCOPY     never  . EXTRACORPOREAL SHOCK WAVE LITHOTRIPSY    . PALATE / UVULA BIOPSY / EXCISION  2006   "polyp on uvula cut out"   Social History   Socioeconomic History  . Marital status: Divorced    Spouse name: Not on file  . Number of children: Not on file  . Years of education: Not on file  . Highest education level: Not on file  Occupational History  . Not on file  Tobacco Use  . Smoking status: Former Smoker    Packs/day: 1.00    Years: 15.00    Pack years: 15.00    Types: Cigarettes    Quit date: 12/01/1997    Years since quitting: 22.6  . Smokeless tobacco: Never Used  Vaping Use  . Vaping Use: Never used  Substance and Sexual Activity  .  Alcohol use: Yes    Comment: Beer.  . Drug use: No  . Sexual activity: Not Currently  Other Topics Concern  . Not on file  Social History Narrative   Divorced 04/2012, 2 children ages daughter 75yo, son 62yo; exercise - moving on the job, walking, works as Electrical engineer   Social Determinants of Radio broadcast assistant Strain:   . Difficulty of Paying Living Expenses: Not on file  Food Insecurity:   . Worried About Charity fundraiser in the Last Year: Not on file  . Ran Out of Food in the Last Year: Not on file  Transportation Needs:   . Lack of Transportation (Medical): Not on file  . Lack of Transportation (Non-Medical): Not on file  Physical Activity:   .  Days of Exercise per Week: Not on file  . Minutes of Exercise per Session: Not on file  Stress:   . Feeling of Stress : Not on file  Social Connections:   . Frequency of Communication with Friends and Family: Not on file  . Frequency of Social Gatherings with Friends and Family: Not on file  . Attends Religious Services: Not on file  . Active Member of Clubs or Organizations: Not on file  . Attends Archivist Meetings: Not on file  . Marital Status: Not on file   Allergies  Allergen Reactions  . Penicillins Other (See Comments)    Hands & feet peel. "childhood allergy"   Family History  Problem Relation Age of Onset  . Heart disease Mother        pacemaker  . Heart disease Father 49       died of MI, 45s  . Diabetes Father   . Stroke Father   . Migraines Sister   . Mental illness Sister   . Cancer Neg Hx   . Hyperlipidemia Neg Hx   . Hypertension Neg Hx   . Colon cancer Neg Hx      Current Outpatient Medications (Cardiovascular):  .  amLODipine (NORVASC) 5 MG tablet, Take 1 tablet (5 mg total) by mouth daily. [name]  lisinopril-hydrochlorothiazide (ZESTORETIC) 20-12.5 MG tablet, Take 1 tablet by mouth daily.    Current Outpatient Medications (Hematological):  [name]  Cyanocobalamin (VITAMIN B 12 PO), Take 1 tablet by mouth.  .  ferrous sulfate 325 (65 FE) MG tablet, Take 1 tablet (325 mg total) by mouth 2 (two) times daily with a meal. .  folic acid (FOLVITE) 1 MG tablet, Take 1 tablet (1 mg total) by mouth daily.  Current Outpatient Medications (Other):  [name]  B Complex-C (B-COMPLEX WITH VITAMIN C) tablet, Take 1 tablet by mouth daily. .  mebendazole (VERMOX) 100 MG chewable tablet, 1 tablet now, repeat in 3 weeks .  Nutritional Supplements (VITAMIN D BOOSTER PO), Take by mouth.  .  phytonadione (VITAMIN K) 5 MG tablet, Take 2 tablets (10 mg total) by mouth daily. .  polyethylene glycol (MIRALAX / GLYCOLAX) packet, Take 17 g by mouth daily as needed. .  Potassium 99  MG TABS, Take 2 tablets by mouth. .  thiamine 100 MG tablet, Take 1 tablet (100 mg total) by mouth daily. .  Vitamin D, Ergocalciferol, (DRISDOL) 50000 units CAPS capsule, TAKE 1 CAPSULE (50,000 UNITS TOTAL) BY MOUTH EVERY 7 (SEVEN) DAYS.   Reviewed prior external information including notes and imaging from  primary care provider As well as notes that were available from care everywhere and other healthcare systems.  Past medical  history, social, surgical and family history all reviewed in electronic medical record.  No pertanent information unless stated regarding to the chief complaint.   Review of Systems:  No headache, visual changes, nausea, vomiting, diarrhea, constipation, dizziness, abdominal pain, skin rash, fevers, chills, night sweats, weight loss, swollen lymph nodes, body aches, joint swelling, chest pain, shortness of breath, mood changes. POSITIVE muscle aches  Objective  Blood pressure 102/64, pulse (!) 41, height 6\' 3"  (1.905 m), weight 187 lb (84.8 kg), SpO2 98 %.   General: No apparent distress alert and oriented x3 mood and affect normal, dressed appropriately.  HEENT: Pupils equal, extraocular movements intact  Respiratory: Patient's speak in full sentences and does not appear short of breath  Cardiovascular: No lower extremity edema, non tender, no erythema  Left elbow shows the patient is still tender to palpation over the lateral epicondylar region.  No significant swelling.  No weakness with resisted wrist extension but does have increasing discomfort. Left leg shows some very mild tightness of the hamstring.  Mild pain in the left gluteal area as well.  Patient does have some pain in the left knee.  Mild positive McMurray still noted.  No swelling noted though.  Limited musculoskeletal ultrasound was performed and interpreted by Lyndal Pulley  Limited musculoskeletal ultrasound of patient's left elbow shows that patient does not have any true tearing but still  some hypoechoic changes at the insertion of the common extensor tendon.  Mild increase in neovascularization in the area.  No cortical irregularity. Impression: Lateral epicondylitis continuing     Impression and Recommendations:     The above documentation has been reviewed and is accurate and complete Lyndal Pulley, DO

## 2020-08-03 NOTE — Telephone Encounter (Signed)
Left message for patient that letter was in his MyChart.

## 2020-08-03 NOTE — Patient Instructions (Signed)
  Restart exercises See me in 4-5 weeks

## 2020-08-03 NOTE — Assessment & Plan Note (Signed)
Patient has made some improvement.  Still shows some mild increase in neovascularization in the area on ultrasound today.  Patient is going to progress and is able to do 20 pound lifting limits.  Patient's work did not have anything that would allow him to not do it initially with a 10 pound lifting limit.  We will see if they will have that at this time.  If not patient will start in 2 weeks with a full duty trial.  Patient will continue to avoid the wrist extension repetitively and follow-up with me again in 4 to 6 weeks

## 2020-08-03 NOTE — Telephone Encounter (Signed)
Pt called, employer requesting changes to letter we wrote today.  #1 please chang the # to "lbs." #2 Employer requesting [place] to define a trial period.  Ok to give to me for scan/email to pt.

## 2020-08-03 NOTE — Assessment & Plan Note (Addendum)
Patient brings up left knee pain.  Has had some difficulty and looks when reviewing the chart since 2017.  X-rays at that time did show mild to moderate arthritic changes of the medial compartment.  No instability of the knee but patient does have a positive McMurray's which is concerning for maybe meniscal injury.  Discussed we could consider injections.  Follow-up with me again in 4 to 6 weeks has been over a year and discussed the possibility of an MRI but patient would like to avoid it at this time.

## 2020-08-03 NOTE — Assessment & Plan Note (Signed)
Seems to be significantly better at this time.  The patient still has some mild discomfort but nothing severe on exam.

## 2020-08-04 NOTE — Telephone Encounter (Signed)
Emailed letter to pt per his request 5pm 08/03/2020.

## 2020-08-31 ENCOUNTER — Ambulatory Visit: Payer: Self-pay

## 2020-08-31 ENCOUNTER — Ambulatory Visit (INDEPENDENT_AMBULATORY_CARE_PROVIDER_SITE_OTHER): Payer: BC Managed Care – PPO | Admitting: Family Medicine

## 2020-08-31 ENCOUNTER — Encounter: Payer: Self-pay | Admitting: Family Medicine

## 2020-08-31 ENCOUNTER — Other Ambulatory Visit: Payer: Self-pay

## 2020-08-31 VITALS — BP 110/56 | HR 54 | Ht 75.0 in | Wt 187.0 lb

## 2020-08-31 DIAGNOSIS — G8929 Other chronic pain: Secondary | ICD-10-CM

## 2020-08-31 DIAGNOSIS — G5702 Lesion of sciatic nerve, left lower limb: Secondary | ICD-10-CM

## 2020-08-31 DIAGNOSIS — M25562 Pain in left knee: Secondary | ICD-10-CM | POA: Diagnosis not present

## 2020-08-31 DIAGNOSIS — M7712 Lateral epicondylitis, left elbow: Secondary | ICD-10-CM | POA: Diagnosis not present

## 2020-08-31 NOTE — Assessment & Plan Note (Signed)
Continues to make good progress at this time.  Discussed following patient continues to make improvement we need for further imaging.  Patient is in agreement and feels he continues to make improvement.  Patient was doing significant amount of work initially and now because he has been actually out of work for the last 10 weeks seems to be doing a little bit better.

## 2020-08-31 NOTE — Assessment & Plan Note (Signed)
Improvement noted.  Ultrasound seems that patient is doing significantly better at this time as well.  Patient will be held for another 2 weeks and then we will start to have patient do full duty.  Patient will follow up again in 6 weeks to hopefully close this injury and allow patient to return to all regular duties.

## 2020-08-31 NOTE — Assessment & Plan Note (Signed)
Patient states doing better as well.  Discussed potential injection or formal physical therapy which patient declined.  No locking or giving out on him at this time.  Follow-up again 6 weeks

## 2020-08-31 NOTE — Patient Instructions (Signed)
  Continue to do exercises Ice after work will be crucial  Follow up in 6 weeks

## 2020-08-31 NOTE — Progress Notes (Signed)
[place] Phone: [phone] Subjective:   [name], am serving as a scribe for Dr. Hulan Saas. This visit occurred during the SARS-CoV-2 public health emergency.  Safety protocols were in place, including screening questions prior to the visit, additional usage of staff PPE, and extensive cleaning of exam room while observing appropriate contact time as indicated for disinfecting solutions.   I'm seeing this patient by the request  of:  Jeffery Lung, MD  CC: Multiple complaint follow-up  [id number]   08/03/2020 Patient brings up left knee pain.  Has had some difficulty and looks when reviewing the chart since 2017.  X-rays at that time did show mild to moderate arthritic changes of the medial compartment.  No instability of the knee but patient does have a positive McMurray's which is concerning for maybe meniscal injury.  Discussed we could consider injections.  Follow-up with me again in 4 to 6 weeks has been over a year and discussed the possibility of an MRI but patient would like to avoid it at this time.  Seems to be significantly better at this time.  The patient still has some mild discomfort but nothing severe on exam.  Patient has made some improvement.  Still shows some mild increase in neovascularization in the area on ultrasound today.  Patient is going to progress and is able to do 20 pound lifting limits.  Patient's work did not have anything that would allow him to not do it initially with a 10 pound lifting limit.  We will see if they will have that at this time.  If not patient will start in 2 weeks with a full duty trial.  Patient will continue to avoid the wrist extension repetitively and follow-up with me again in 4 to 6 weeks  Update 08/31/2020 Jeffery [name] is a 59 y.o. male coming in with complaint of left knee, hamstring and left elbow pain. Patient states that he has been out  of work and is doing the exercises. Patient states that he continues to have left glute and lateral hip pain along with left knee pain. Patient also having left elbow pain that is improving. Feels all areas just need more time.  Patient states that very minimal pain with most things.  Patient feels like if he tried to do some of the work so would start having increasing discomfort and pain again.    Past Medical History:  Diagnosis Date  . Anemia   . Central serous retinopathy    hx/o intraocular injection therapy for 3 years; no change after 3 years of therapy, no visual c/o  . Depression   . Former smoker   . History of blood transfusion 04/17/2017   "related to anemia"  . History of kidney stones   . Hypertension   . Left varicocele   . Port-wine stain of face   . Pre-diabetes    Past Surgical History:  Procedure Laterality Date  . COLONOSCOPY     never  . EXTRACORPOREAL SHOCK WAVE LITHOTRIPSY    . PALATE / UVULA BIOPSY / EXCISION  2006   "polyp on uvula cut out"   Social History   Socioeconomic History  . Marital status: Divorced    Spouse name: Not on file  . Number of children: Not on file  . Years of education: Not on file  . Highest education level: Not on file  Occupational History  . Not  on file  Tobacco Use  . Smoking status: Former Smoker    Packs/day: 1.00    Years: 15.00    Pack years: 15.00    Types: Cigarettes    Quit date: 12/01/1997    Years since quitting: 22.7  . Smokeless tobacco: Never Used  Vaping Use  . Vaping Use: Never used  Substance and Sexual Activity  . Alcohol use: Yes    Comment: Beer.  . Drug use: No  . Sexual activity: Not Currently  Other Topics Concern  . Not on file  Social History Narrative   Divorced 04/2012, 2 children ages daughter 57yo, son 47yo; exercise - moving on the job, walking, works as Electrical engineer   Social Determinants of Radio broadcast assistant Strain:   . Difficulty of Paying Living Expenses: Not  on file  Food Insecurity:   . Worried About Charity fundraiser in the Last Year: Not on file  . Ran Out of Food in the Last Year: Not on file  Transportation Needs:   . Lack of Transportation (Medical): Not on file  . Lack of Transportation (Non-Medical): Not on file  Physical Activity:   . Days of Exercise per Week: Not on file  . Minutes of Exercise per Session: Not on file  Stress:   . Feeling of Stress : Not on file  Social Connections:   . Frequency of Communication with Friends and Family: Not on file  . Frequency of Social Gatherings with Friends and Family: Not on file  . Attends Religious Services: Not on file  . Active Member of Clubs or Organizations: Not on file  . Attends Archivist Meetings: Not on file  . Marital Status: Not on file   Allergies  Allergen Reactions  . Penicillins Other (See Comments)    Hands & feet peel. "childhood allergy"   Family History  Problem Relation Age of Onset  . Heart disease Mother        pacemaker  . Heart disease Father 34       died of MI, 83s  . Diabetes Father   . Stroke Father   . Migraines Sister   . Mental illness Sister   . Cancer Neg Hx   . Hyperlipidemia Neg Hx   . Hypertension Neg Hx   . Colon cancer Neg Hx      Current Outpatient Medications (Cardiovascular):  .  amLODipine (NORVASC) 5 MG tablet, Take 1 tablet (5 mg total) by mouth daily. [name]  lisinopril-hydrochlorothiazide (ZESTORETIC) 20-12.5 MG tablet, Take 1 tablet by mouth daily.    Current Outpatient Medications (Hematological):  [name]  Cyanocobalamin (VITAMIN B 12 PO), Take 1 tablet by mouth.  .  ferrous sulfate 325 (65 FE) MG tablet, Take 1 tablet (325 mg total) by mouth 2 (two) times daily with a meal. .  folic acid (FOLVITE) 1 MG tablet, Take 1 tablet (1 mg total) by mouth daily.  Current Outpatient Medications (Other):  [name]  B Complex-C (B-COMPLEX WITH VITAMIN C) tablet, Take 1 tablet by mouth daily. .  mebendazole (VERMOX) 100 MG chewable  tablet, 1 tablet now, repeat in 3 weeks .  Nutritional Supplements (VITAMIN D BOOSTER PO), Take by mouth.  .  phytonadione (VITAMIN K) 5 MG tablet, Take 2 tablets (10 mg total) by mouth daily. .  polyethylene glycol (MIRALAX / GLYCOLAX) packet, Take 17 g by mouth daily as needed. .  Potassium 99 MG TABS, Take 2 tablets by mouth. [name]  thiamine 100 MG tablet, Take 1 tablet (100 mg total) by mouth daily. .  Vitamin D, Ergocalciferol, (DRISDOL) 50000 units CAPS capsule, TAKE 1 CAPSULE (50,000 UNITS TOTAL) BY MOUTH EVERY 7 (SEVEN) DAYS.   Reviewed prior external information including notes and imaging from  primary care provider As well as notes that were available from care everywhere and other healthcare systems.  Past medical history, social, surgical and family history all reviewed in electronic medical record.  No pertanent information unless stated regarding to the chief complaint.   Review of Systems:  No headache, visual changes, nausea, vomiting, diarrhea, constipation, dizziness, abdominal pain, skin rash, fevers, chills, night sweats, weight loss, swollen lymph nodes, body aches, joint swelling, chest pain, shortness of breath, mood changes. POSITIVE muscle aches  Objective  Blood pressure (!) 110/56, pulse (!) 54, height 6\' 3"  (1.905 m), weight 187 lb (84.8 kg), SpO2 98 %.   General: No apparent distress alert and oriented x3 mood and affect normal, dressed appropriately.  HEENT: Pupils equal, extraocular movements intact  Respiratory: Patient's speak in full sentences and does not appear short of breath  Cardiovascular: No lower extremity edema, non tender, no erythema  Mild arthritic changes. Low back exam shows some very mild loss of lordosis.  Tightness with Corky Sox still left greater than right.  Negative straight leg test.  Neurovascularly intact distally.  5 out of 5 strength Left elbow exam shows the patient does have some mild tenderness to palpation over the lateral  epicondylar region.  No pain no with resisted extension of the wrist.  Patient does have full range of motion of the elbow noted.  Neurovascular intact distally with good grip strength Left knee exam tender to palpation over the medial joint space.  Patient does have some mild abnormality of the thigh to calf ratio.  Full range of motion today mild pain with McMurray's but improvement  Limited musculoskeletal ultrasound was performed and interpreted by Lyndal Pulley  Limited ultrasound of patient's left elbow shows the patient does have very mild increase in neovascularization in the common extensor tendon.  Overall tendon seems to be more healthy with decrease in hypoechoic changes. Impression: Interval healing noted.   Impression and Recommendations:     The above documentation has been reviewed and is accurate and complete Lyndal Pulley, DO

## 2020-09-01 ENCOUNTER — Encounter: Payer: Self-pay | Admitting: *Deleted

## 2020-10-12 ENCOUNTER — Ambulatory Visit (INDEPENDENT_AMBULATORY_CARE_PROVIDER_SITE_OTHER): Payer: BC Managed Care – PPO

## 2020-10-12 ENCOUNTER — Ambulatory Visit: Payer: BC Managed Care – PPO | Admitting: Family Medicine

## 2020-10-12 ENCOUNTER — Other Ambulatory Visit: Payer: Self-pay

## 2020-10-12 ENCOUNTER — Encounter: Payer: Self-pay | Admitting: Family Medicine

## 2020-10-12 VITALS — BP 122/80 | HR 106 | Ht 75.0 in

## 2020-10-12 DIAGNOSIS — S76302D Unspecified injury of muscle, fascia and tendon of the posterior muscle group at thigh level, left thigh, subsequent encounter: Secondary | ICD-10-CM

## 2020-10-12 DIAGNOSIS — M25522 Pain in left elbow: Secondary | ICD-10-CM

## 2020-10-12 DIAGNOSIS — G5702 Lesion of sciatic nerve, left lower limb: Secondary | ICD-10-CM

## 2020-10-12 DIAGNOSIS — M545 Low back pain, unspecified: Secondary | ICD-10-CM | POA: Diagnosis not present

## 2020-10-12 DIAGNOSIS — M7712 Lateral epicondylitis, left elbow: Secondary | ICD-10-CM

## 2020-10-12 IMAGING — DX DG LUMBAR SPINE 2-3V
3 series · 3 of 3 positions shown · non-contrast
Comparison: None.

CLINICAL DATA: Low back pain.  No known injury.

EXAM:
LUMBAR SPINE - 2-3 VIEW

[l-spine ap]
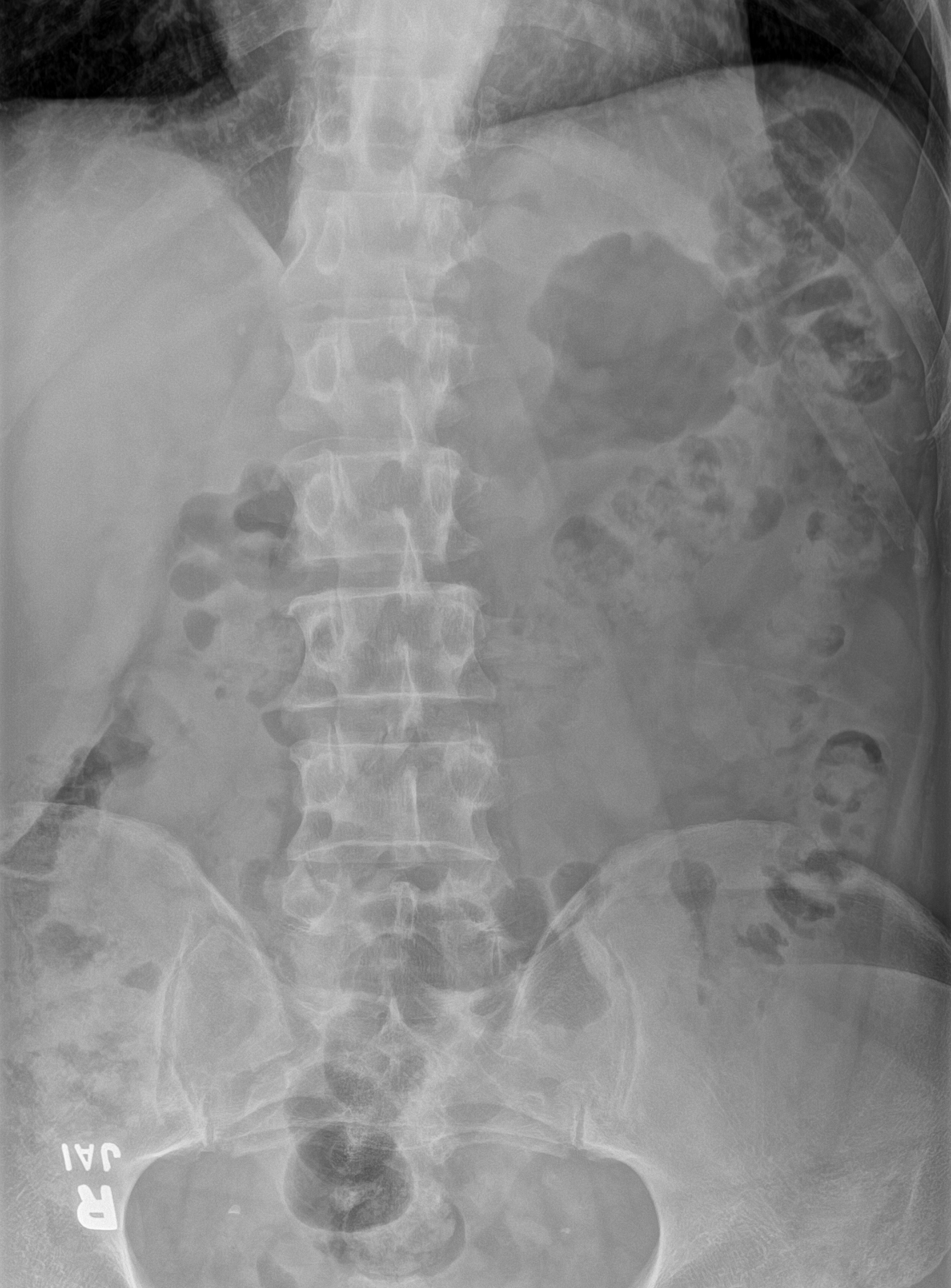

[l-spine lateral (1 of 2)]
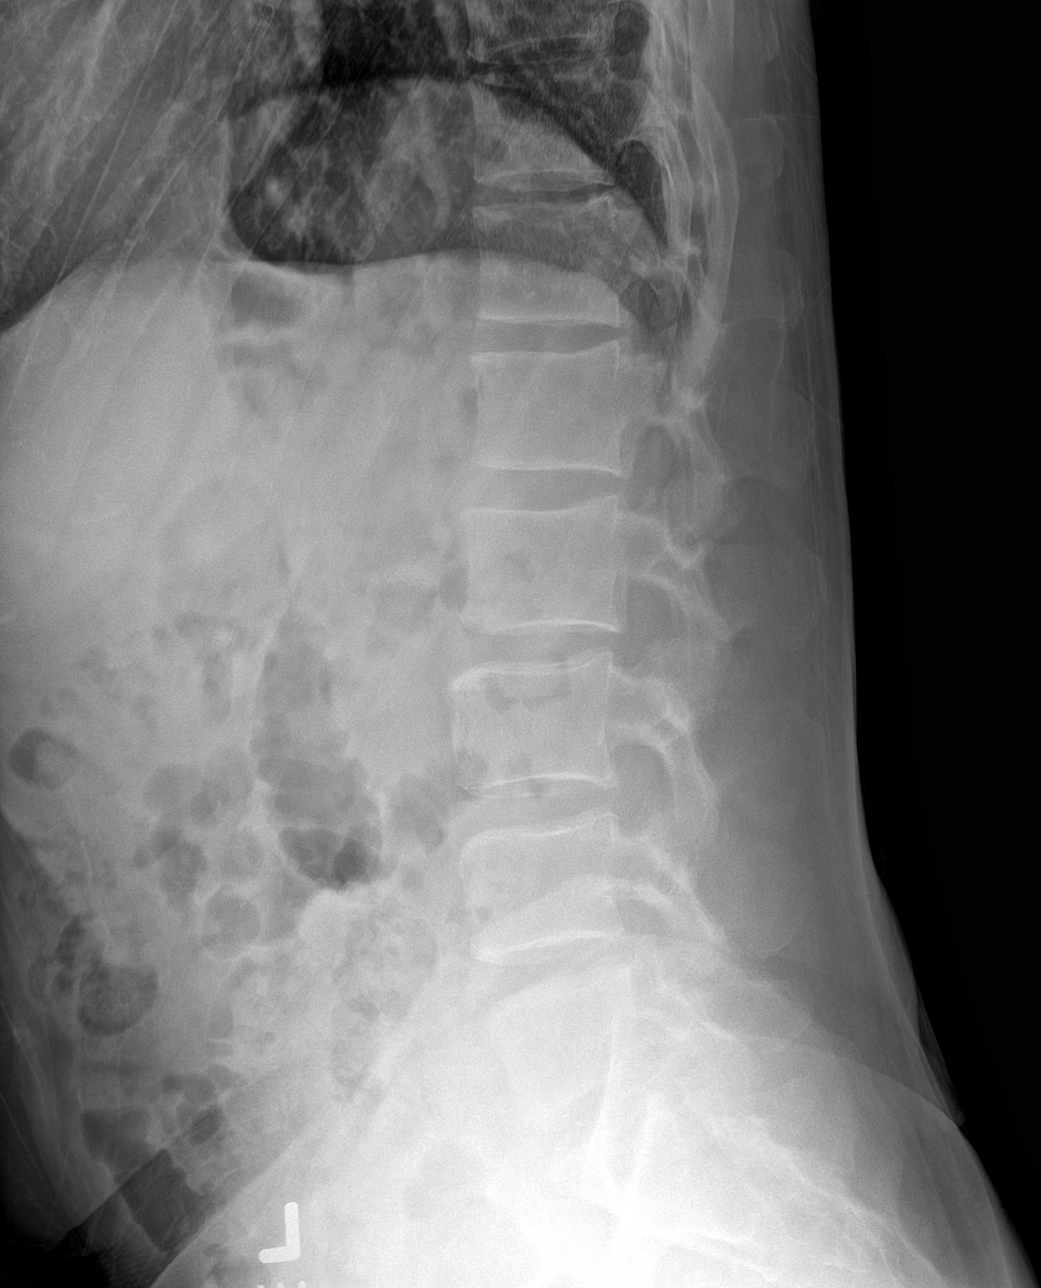

[l-spine lateral (2 of 2)]
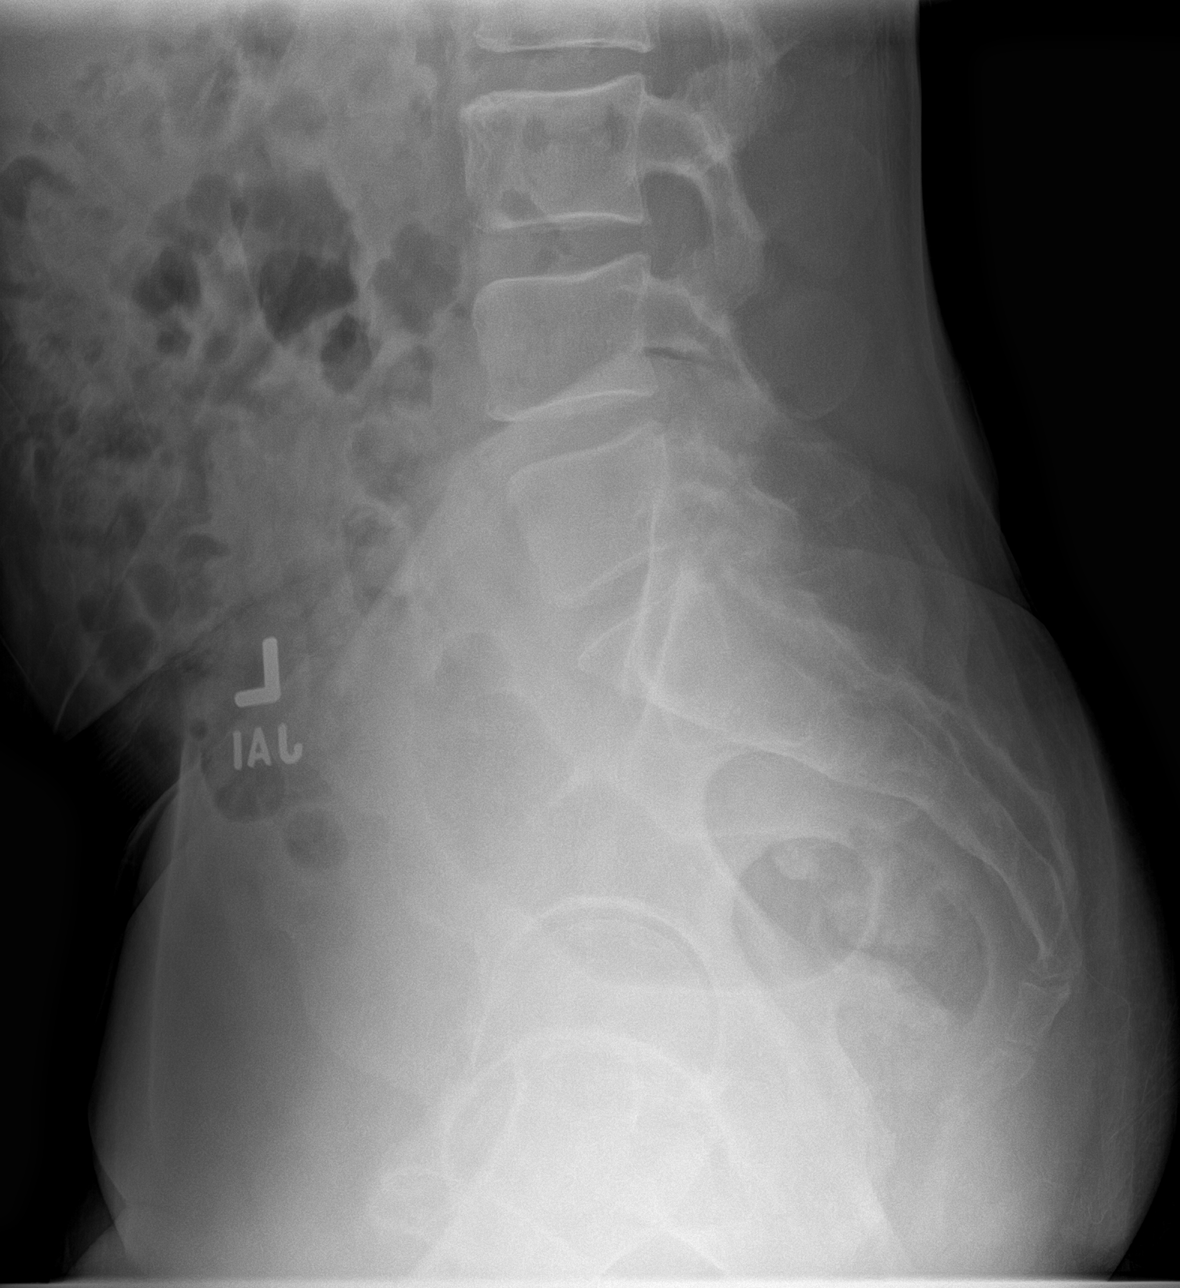

[3 of 3 positions shown; findings below may reference images not displayed]

FINDINGS: There are 5 non rib-bearing lumbar type vertebral bodies

Mild scoliotic curvature of the thoracolumbar spine with mid
component convex the right measuring only approximately 4 degrees
(as measured from the superior endplate of T12 to the inferior
endplate of L2), potentially positional. There is straightening of
the expected lumbar lordosis. No anterolisthesis or retrolisthesis.

Lumbar vertebral body heights appear preserved.

Mild multilevel lumbar spine DDD, worse at L2-L3 with disc space
height loss, endplate irregularity and sclerosis.

Limited visualization of the bilateral SI joints is normal.

Moderate colonic stool burden without evidence of enteric
obstruction. Regional soft tissues appear normal.
IMPRESSION: Mild multilevel lumbar spine DDD, worse at L2-L3.

## 2020-10-12 IMAGING — DX DG HIP (WITH OR WITHOUT PELVIS) 2-3V*L*
3 series · 3 of 3 positions shown · non-contrast
Comparison: None.

CLINICAL DATA: Chronic left hip pain.  No known injury.

EXAM:
DG HIP (WITH OR WITHOUT PELVIS) 2-3V LEFT

[pelvis ap]
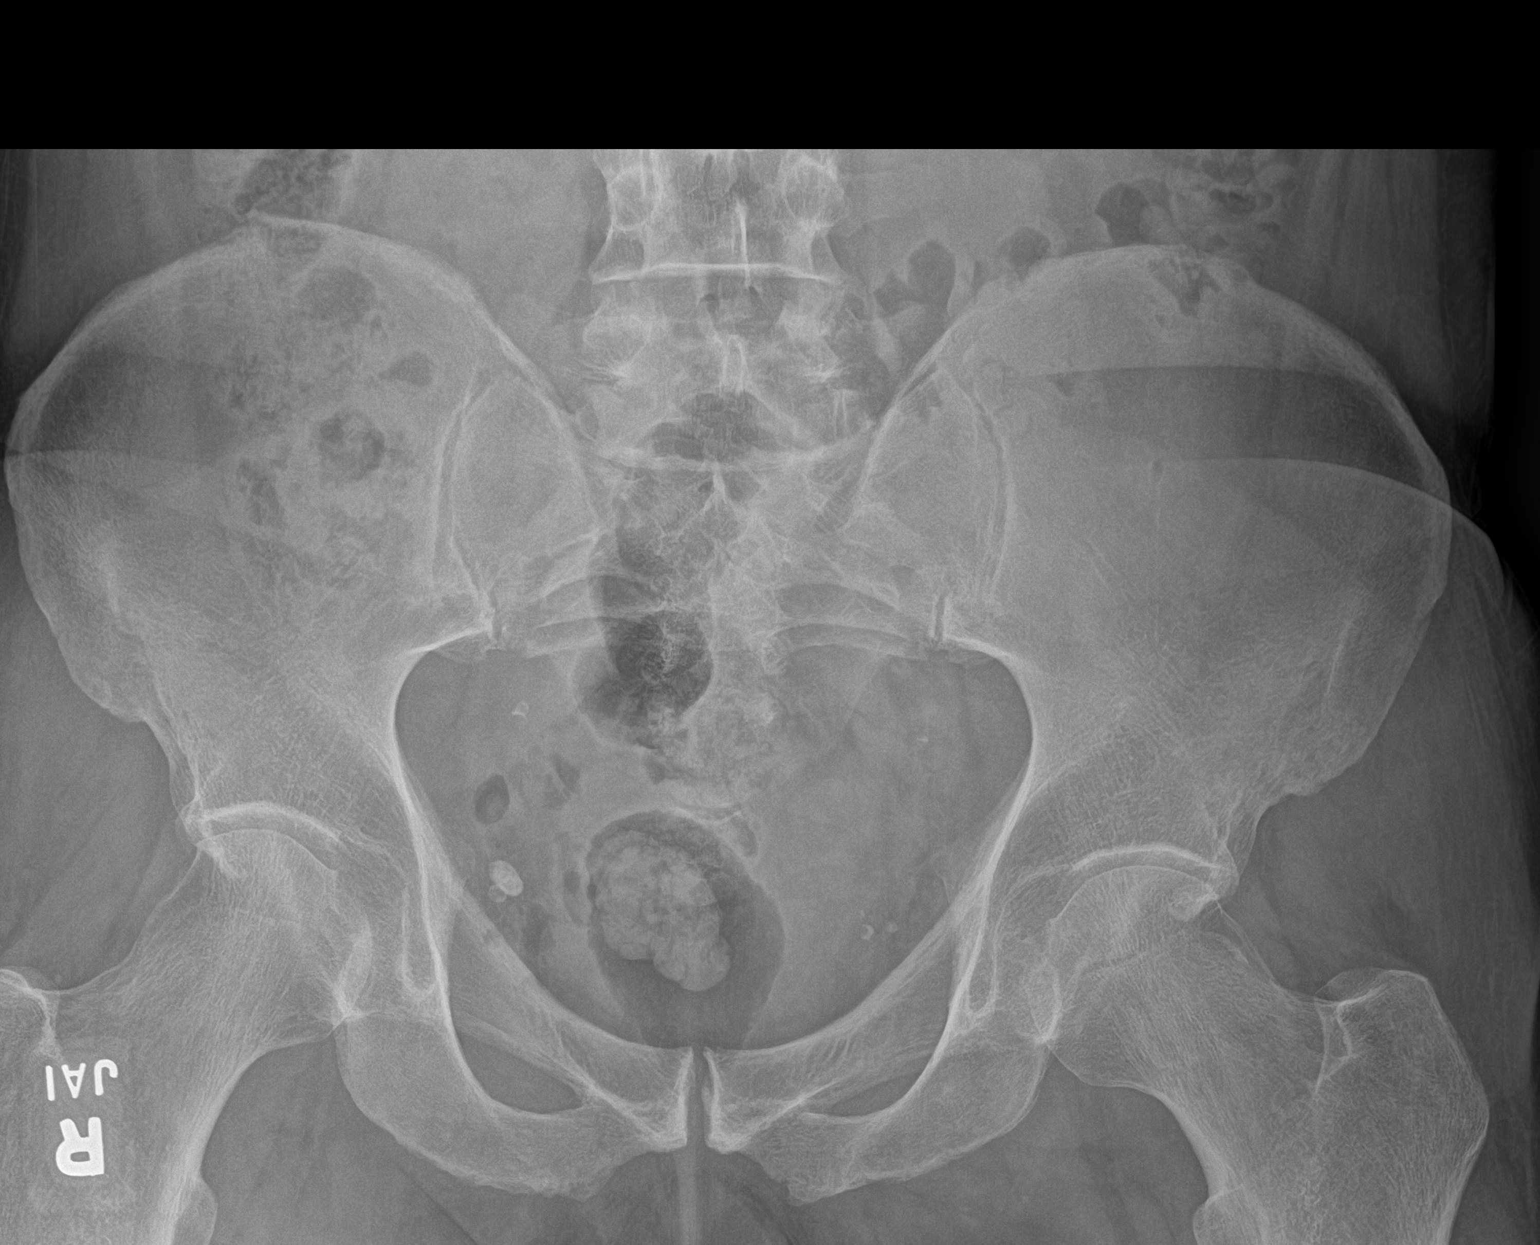

[hip ap]
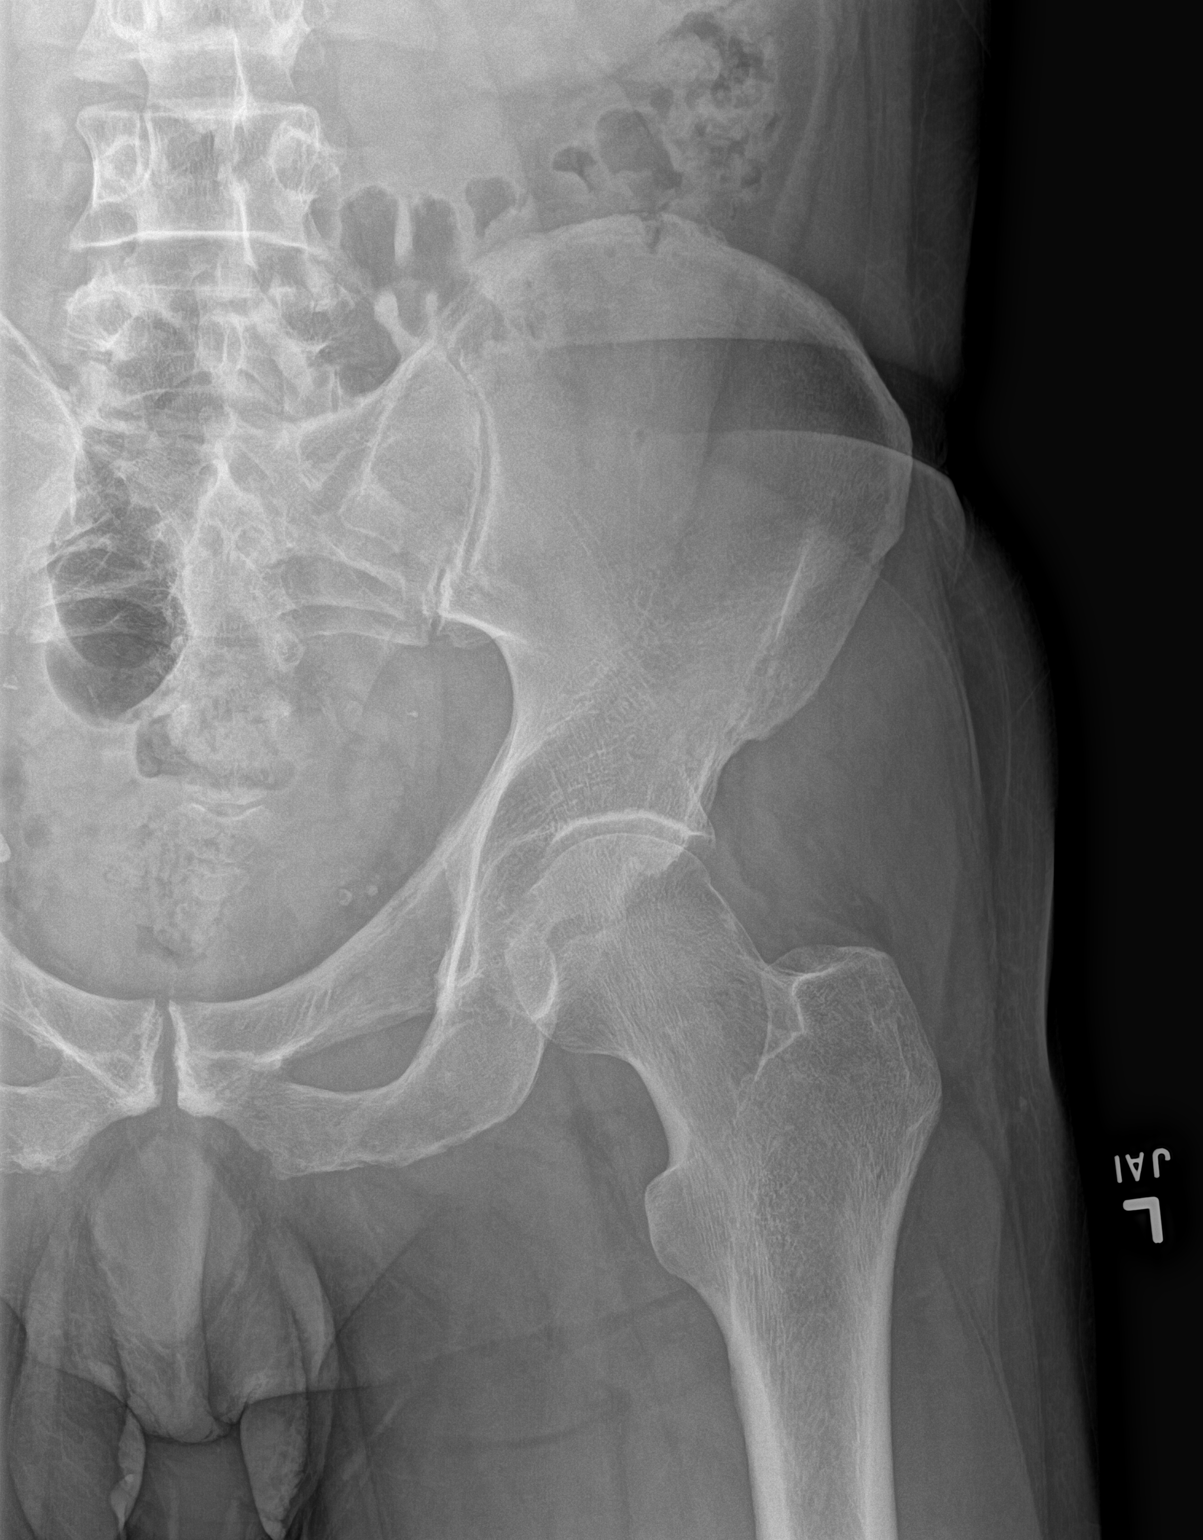

[hip frog leg]
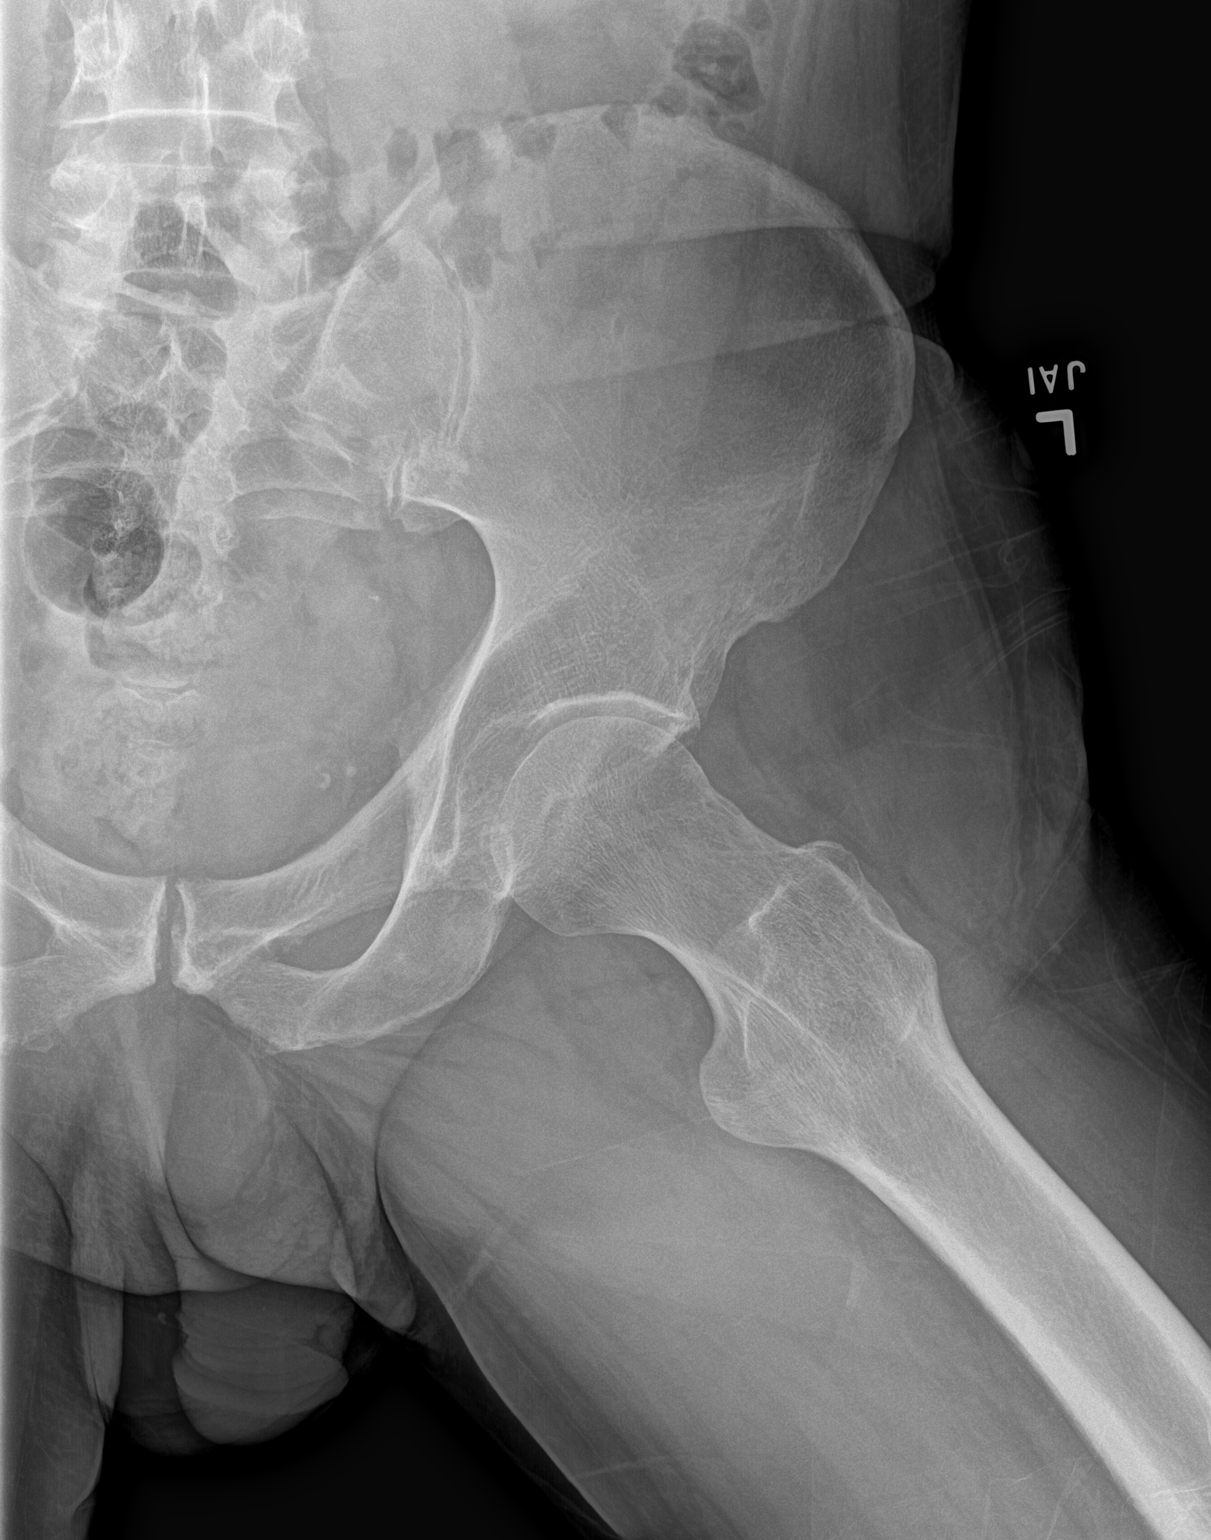

[3 of 3 positions shown; findings below may reference images not displayed]

FINDINGS: No fracture or dislocation.

Mild degenerative change of the left hip with joint space loss,
subchondral sclerosis and osteophytosis. No evidence of avascular
necrosis.

Limited visualization of the pelvis is normal. There is an abnormal
concavity involving the posterolateral aspect of the right femoral
head, incompletely evaluated.

Phleboliths overlie the lower pelvis bilaterally.

Moderate to large colonic stool burden without evidence of enteric
obstruction.
IMPRESSION: 1. Mild degenerative change of the left hip.
2. Abnormal concavity involving the posterolateral aspect of the
contralateral right femoral head, incompletely evaluated though of
uncertain etiology or clinical significance. Further evaluation
dedicated radiographs of the right hip performed as indicated.

## 2020-10-12 MED ORDER — TIZANIDINE HCL 2 MG PO TABS: 2.0000 mg | ORAL_TABLET | Freq: Every day | ORAL | 0 refills | Status: DC

## 2020-10-12 NOTE — Assessment & Plan Note (Addendum)
continued pain  Discussed HEP  Discussed also possible hamstring as well  Discussed Pt  If not better need MRI of hip to further evaluate  Very low dose of Zanaflex given.

## 2020-10-12 NOTE — Progress Notes (Signed)
[facility] [place] Phone: [phone] Subjective:   [name], am serving as a scribe for Dr. Hulan Saas. This visit occurred during the SARS-CoV-2 public health emergency.  Safety protocols were in place, including screening questions prior to the visit, additional usage of staff PPE, and extensive cleaning of exam room while observing appropriate contact time as indicated for disinfecting solutions.   I'm seeing this patient by the request  of:  Denita Lung, MD  CC: Elbow pain follow-up  [id number]   08/31/2020 Patient states doing better as well.  Discussed potential injection or formal physical therapy which patient declined.  No locking or giving out on him at this time.  Follow-up again 6 weeks  Continues to make good progress at this time.  Discussed following patient continues to make improvement we need for further imaging.  Patient is in agreement and feels he continues to make improvement.  Patient was doing significant amount of work initially and now because he has been actually out of work for the last 10 weeks seems to be doing a little bit better.  Improvement noted.  Ultrasound seems that patient is doing significantly better at this time as well.  Patient will be held for another 2 weeks and then we will start to have patient do full duty.  Patient will follow up again in 6 weeks to hopefully close this injury and allow patient to return to all regular duties.   Update 10/12/2020 VESTAL MARKIN is a 60 y.o. male coming in with complaint of left knee pain, left elbow pain, left hip pain. Feels that elbow pain has slightly improved. States that he returned to work but that pain still occurred but not as bad.   Pain in left glute and hip. Has a hard time sleeping on left side and lying down in bed. Has used Advil for pain prn. Denies any radiating symptoms.    Last ultrasound taken on August 31, 2020 did show there is some interval healing.  Past Medical History:  Diagnosis Date  . Anemia   . Central serous retinopathy    hx/o intraocular injection therapy for 3 years; no change after 3 years of therapy, no visual c/o  . Depression   . Former smoker   . History of blood transfusion 04/17/2017   "related to anemia"  . History of kidney stones   . Hypertension   . Left varicocele   . Port-wine stain of face   . Pre-diabetes    Past Surgical History:  Procedure Laterality Date  . COLONOSCOPY     never  . EXTRACORPOREAL SHOCK WAVE LITHOTRIPSY    . PALATE / UVULA BIOPSY / EXCISION  2006   "polyp on uvula cut out"   Social History   Socioeconomic History  . Marital status: Divorced    Spouse name: Not on file  . Number of children: Not on file  . Years of education: Not on file  . Highest education level: Not on file  Occupational History  . Not on file  Tobacco Use  . Smoking status: Former Smoker    Packs/day: 1.00    Years: 15.00    Pack years: 15.00    Types: Cigarettes    Quit date: 12/01/1997    Years since quitting: 22.8  . Smokeless tobacco: Never Used  Vaping Use  . Vaping Use: Never used  Substance and Sexual Activity  . Alcohol  use: Yes    Comment: Beer.  . Drug use: No  . Sexual activity: Not Currently  Other Topics Concern  . Not on file  Social History Narrative   Divorced 04/2012, 2 children ages daughter 50yo, son 25yo; exercise - moving on the job, walking, works as Electrical engineer   Social Determinants of Radio broadcast assistant Strain: Not on file  Food Insecurity: Not on file  Transportation Needs: Not on file  Physical Activity: Not on file  Stress: Not on file  Social Connections: Not on file   Allergies  Allergen Reactions  . Penicillins Other (See Comments)    Hands & feet peel. "childhood allergy"   Family History  Problem Relation Age of Onset  . Heart disease Mother        pacemaker  . Heart disease Father  31       died of MI, 72s  . Diabetes Father   . Stroke Father   . Migraines Sister   . Mental illness Sister   . Cancer Neg Hx   . Hyperlipidemia Neg Hx   . Hypertension Neg Hx   . Colon cancer Neg Hx      Current Outpatient Medications (Cardiovascular):  .  amLODipine (NORVASC) 5 MG tablet, Take 1 tablet (5 mg total) by mouth daily. [name]  lisinopril-hydrochlorothiazide (ZESTORETIC) 20-12.5 MG tablet, Take 1 tablet by mouth daily.    Current Outpatient Medications (Hematological):  [name]  Cyanocobalamin (VITAMIN B 12 PO), Take 1 tablet by mouth.   Current Outpatient Medications (Other):  .  tiZANidine (ZANAFLEX) 2 MG tablet, Take 1 tablet (2 mg total) by mouth at bedtime. .  Nutritional Supplements (VITAMIN D BOOSTER PO), Take by mouth.  .  phytonadione (VITAMIN K) 5 MG tablet, Take 2 tablets (10 mg total) by mouth daily. .  polyethylene glycol (MIRALAX / GLYCOLAX) packet, Take 17 g by mouth daily as needed.   Reviewed prior external information including notes and imaging from  primary care provider As well as notes that were available from care everywhere and other healthcare systems.  Past medical history, social, surgical and family history all reviewed in electronic medical record.  No pertanent information unless stated regarding to the chief complaint.   Review of Systems:  No headache, visual changes, nausea, vomiting, diarrhea, constipation, dizziness, abdominal pain, skin rash, fevers, chills, night sweats, weight loss, swollen lymph nodes, body aches, joint swelling, chest pain, shortness of breath, mood changes. POSITIVE muscle aches  Objective  Blood pressure 122/80, pulse (!) 106, height 6\' 3"  (1.905 m), SpO2 99 %.   General: No apparent distress alert and oriented x3 mood and affect normal, dressed appropriately.  HEENT: Pupils equal, extraocular movements intact  Respiratory: Patient's speak in full sentences and does not appear short of breath  Cardiovascular: No  lower extremity edema, non tender, no erythema  Elbow:left  Unremarkable to inspection. Range of motion full pronation, supination, flexion, extension. Strength is full to all of the above directions Stable to varus, valgus stress. Negative moving valgus stress test. Mild tenderness lateral epicodylar region  Ulnar nerve does not sublux. Negative cubital tunnel Tinel's. Contralateral elbow unremarkable  Left hip still shows some mild tightness in the piriformis area.  Mild discomfort of the ischial area.  Patient has good range of motion of the hip.  Negative straight leg test.  Back exam very mild loss of lordosis and very mild tenderness over the left sacroiliac joint  Limited musculoskeletal ultrasound was performed  and interpreted by Lyndal Pulley  Limited ultrasound of patient's left elbow of the lateral epicondylar region shows the common flexor tendon still has what appears to be a small partial thickness tear noted.  Some increase in neovascularization in the area.  Difficulty saving the pictures today though.   Impression and Recommendations:     The above documentation has been reviewed and is accurate and complete Lyndal Pulley, DO

## 2020-10-12 NOTE — Assessment & Plan Note (Addendum)
Discussed HEP we discussed also the possibility of formal physical therapy.  Ultrasound did show that patient did have intersubstance tearing and and we have elected to continue with conservative therapy but I do feel the neck step is MRI.  Patient is working at this point and will continue to do so with no restrictions.

## 2020-10-12 NOTE — Patient Instructions (Signed)
Zanaflex 2mg  Continue exercises I would consider PT Send me a message in 4 weeks If not better recommend MRI of hip Xray on way out today Have appt again in 7-8 weeks

## 2020-11-03 ENCOUNTER — Other Ambulatory Visit: Payer: Self-pay | Admitting: Family Medicine

## 2020-11-03 ENCOUNTER — Encounter: Payer: Self-pay | Admitting: Family Medicine

## 2020-12-03 NOTE — Progress Notes (Signed)
[place] Phone: [phone] Subjective:   [name], am serving as a scribe for Dr. Hulan Saas. This visit occurred during the SARS-CoV-2 public health emergency.  Safety protocols were in place, including screening questions prior to the visit, additional usage of staff PPE, and extensive cleaning of exam room while observing appropriate contact time as indicated for disinfecting solutions.   I'm seeing this patient by the request  of:  Denita Lung, MD  CC: Multiple complaints including back pain, hip pain, knee pain  [id number]   10/12/2020 continued pain  Discussed HEP  Discussed also possible hamstring as well  Discussed Pt  If not better need MRI of hip to further evaluate  Very low dose of Zanaflex given.  Discussed HEP we discussed also the possibility of formal physical therapy.  Ultrasound did show that patient did have intersubstance tearing and and we have elected to continue with conservative therapy but I do feel the neck step is MRI.  Patient is working at this point and will continue to do so with no restrictions.  Update 12/04/2020 JAXN CHIQUITO is a 60 y.o. male coming in with complaint of left elbow and left piriformis pain. Hip pain is improving but pain is still present especially in left glute.  His left elbow has been better but today he has pain over the olecranon. States that he was very busy at work.   Left knee pain is worse than last visit. Is wearing brace daily. Is finding himself leaning more on right knee which is now bothering him as well.  Patient also complaining of a cervical spine spasm on left side for one week. Pain is improving. Patient has been in lumbar flexed and cervical extension position while at work due to wearing safety glasses with bifocals and having to lean over a table.        Past Medical History:  Diagnosis Date  . Anemia   . Central  serous retinopathy    hx/o intraocular injection therapy for 3 years; no change after 3 years of therapy, no visual c/o  . Depression   . Former smoker   . History of blood transfusion 04/17/2017   "related to anemia"  . History of kidney stones   . Hypertension   . Left varicocele   . Port-wine stain of face   . Pre-diabetes    Past Surgical History:  Procedure Laterality Date  . COLONOSCOPY     never  . EXTRACORPOREAL SHOCK WAVE LITHOTRIPSY    . PALATE / UVULA BIOPSY / EXCISION  2006   "polyp on uvula cut out"   Social History   Socioeconomic History  . Marital status: Divorced    Spouse name: Not on file  . Number of children: Not on file  . Years of education: Not on file  . Highest education level: Not on file  Occupational History  . Not on file  Tobacco Use  . Smoking status: Former Smoker    Packs/day: 1.00    Years: 15.00    Pack years: 15.00    Types: Cigarettes    Quit date: 12/01/1997    Years since quitting: 23.0  . Smokeless tobacco: Never Used  Vaping Use  . Vaping Use: Never used  Substance and Sexual Activity  . Alcohol use: Yes    Comment: Beer.  . Drug use: No  . Sexual activity: Not Currently  Other Topics  Concern  . Not on file  Social History Narrative   Divorced 04/2012, 2 children ages daughter 77yo, son 40yo; exercise - moving on the job, walking, works as Electrical engineer   Social Determinants of Radio broadcast assistant Strain: Not on file  Food Insecurity: Not on file  Transportation Needs: Not on file  Physical Activity: Not on file  Stress: Not on file  Social Connections: Not on file   Allergies  Allergen Reactions  . Penicillins Other (See Comments)    Hands & feet peel. "childhood allergy"   Family History  Problem Relation Age of Onset  . Heart disease Mother        pacemaker  . Heart disease Father 22       died of MI, 60s  . Diabetes Father   . Stroke Father   . Migraines Sister   . Mental illness Sister    . Cancer Neg Hx   . Hyperlipidemia Neg Hx   . Hypertension Neg Hx   . Colon cancer Neg Hx      Current Outpatient Medications (Cardiovascular):  .  amLODipine (NORVASC) 5 MG tablet, Take 1 tablet (5 mg total) by mouth daily. [name]  lisinopril-hydrochlorothiazide (ZESTORETIC) 20-12.5 MG tablet, Take 1 tablet by mouth daily.     Current Outpatient Medications (Other):  [name]  AMBULATORY NON FORMULARY MEDICATION, 1 Units by Other route once for 1 dose. [name]  tiZANidine (ZANAFLEX) 2 MG tablet, TAKE 1 TABLET BY MOUTH AT BEDTIME.   Reviewed prior external information including notes and imaging from  primary care provider As well as notes that were available from care everywhere and other healthcare systems.  Past medical history, social, surgical and family history all reviewed in electronic medical record.  No pertanent information unless stated regarding to the chief complaint.   Review of Systems:  No headache, visual changes, nausea, vomiting, diarrhea, constipation, dizziness, abdominal pain, skin rash, fevers, chills, night sweats, weight loss, swollen lymph nodes, body aches, joint swelling, chest pain, shortness of breath, mood changes. POSITIVE muscle aches  Objective  Blood pressure 122/88, pulse 82, height 6\' 3"  (1.905 m), weight 184 lb (83.5 kg), SpO2 99 %.   General: No apparent distress alert and oriented x3 mood and affect normal, dressed appropriately.  HEENT: Pupils equal, extraocular movements intact  Respiratory: Patient's speak in full sentences and does not appear short of breath  Cardiovascular: No lower extremity edema, non tender, no erythema  Gait normal with good balance and coordination.  MSK:   Low back exam does have loss of lordosis.  Patient does have tenderness over the left piriformis.  Patient still has pain with straight leg test but no true radicular symptoms but does have pain that he says is an ache in the hamstring.  Neurovascularly intact distally.  Left  knee though does have a positive McMurray's noted.  Pain over the medial joint space.   Limited musculoskeletal ultrasound was performed and interpreted by Lyndal Pulley  Limited ultrasound of patient's left knee shows what appears to be a displaced medial meniscus with an acute on chronic tear noted.  Mild displacement at 25 to 50%.  Patient does have mild narrowing of the medial joint space.  Trace effusion of the patellofemoral joint noted.  Lateral meniscus appears to be unremarkable. 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

## 2020-12-04 ENCOUNTER — Encounter: Payer: Self-pay | Admitting: Family Medicine

## 2020-12-04 ENCOUNTER — Ambulatory Visit: Payer: BC Managed Care – PPO | Admitting: Family Medicine

## 2020-12-04 ENCOUNTER — Other Ambulatory Visit: Payer: Self-pay

## 2020-12-04 ENCOUNTER — Ambulatory Visit: Payer: Self-pay

## 2020-12-04 ENCOUNTER — Ambulatory Visit (INDEPENDENT_AMBULATORY_CARE_PROVIDER_SITE_OTHER): Payer: BC Managed Care – PPO

## 2020-12-04 VITALS — BP 122/88 | HR 82 | Ht 75.0 in | Wt 184.0 lb

## 2020-12-04 DIAGNOSIS — G5702 Lesion of sciatic nerve, left lower limb: Secondary | ICD-10-CM

## 2020-12-04 DIAGNOSIS — M25562 Pain in left knee: Secondary | ICD-10-CM

## 2020-12-04 DIAGNOSIS — M25522 Pain in left elbow: Secondary | ICD-10-CM

## 2020-12-04 DIAGNOSIS — M25551 Pain in right hip: Secondary | ICD-10-CM | POA: Diagnosis not present

## 2020-12-04 DIAGNOSIS — M25552 Pain in left hip: Secondary | ICD-10-CM

## 2020-12-04 DIAGNOSIS — G8929 Other chronic pain: Secondary | ICD-10-CM

## 2020-12-04 DIAGNOSIS — M545 Low back pain, unspecified: Secondary | ICD-10-CM

## 2020-12-04 DIAGNOSIS — M25561 Pain in right knee: Secondary | ICD-10-CM

## 2020-12-04 DIAGNOSIS — M7712 Lateral epicondylitis, left elbow: Secondary | ICD-10-CM

## 2020-12-04 IMAGING — DX DG KNEE 3 VIEWS*L*
3 series · 3 of 3 positions shown · non-contrast
Comparison: None.

CLINICAL DATA: Chronic medial left knee pain.

EXAM:
LEFT KNEE - 3 VIEW

[knee ap]
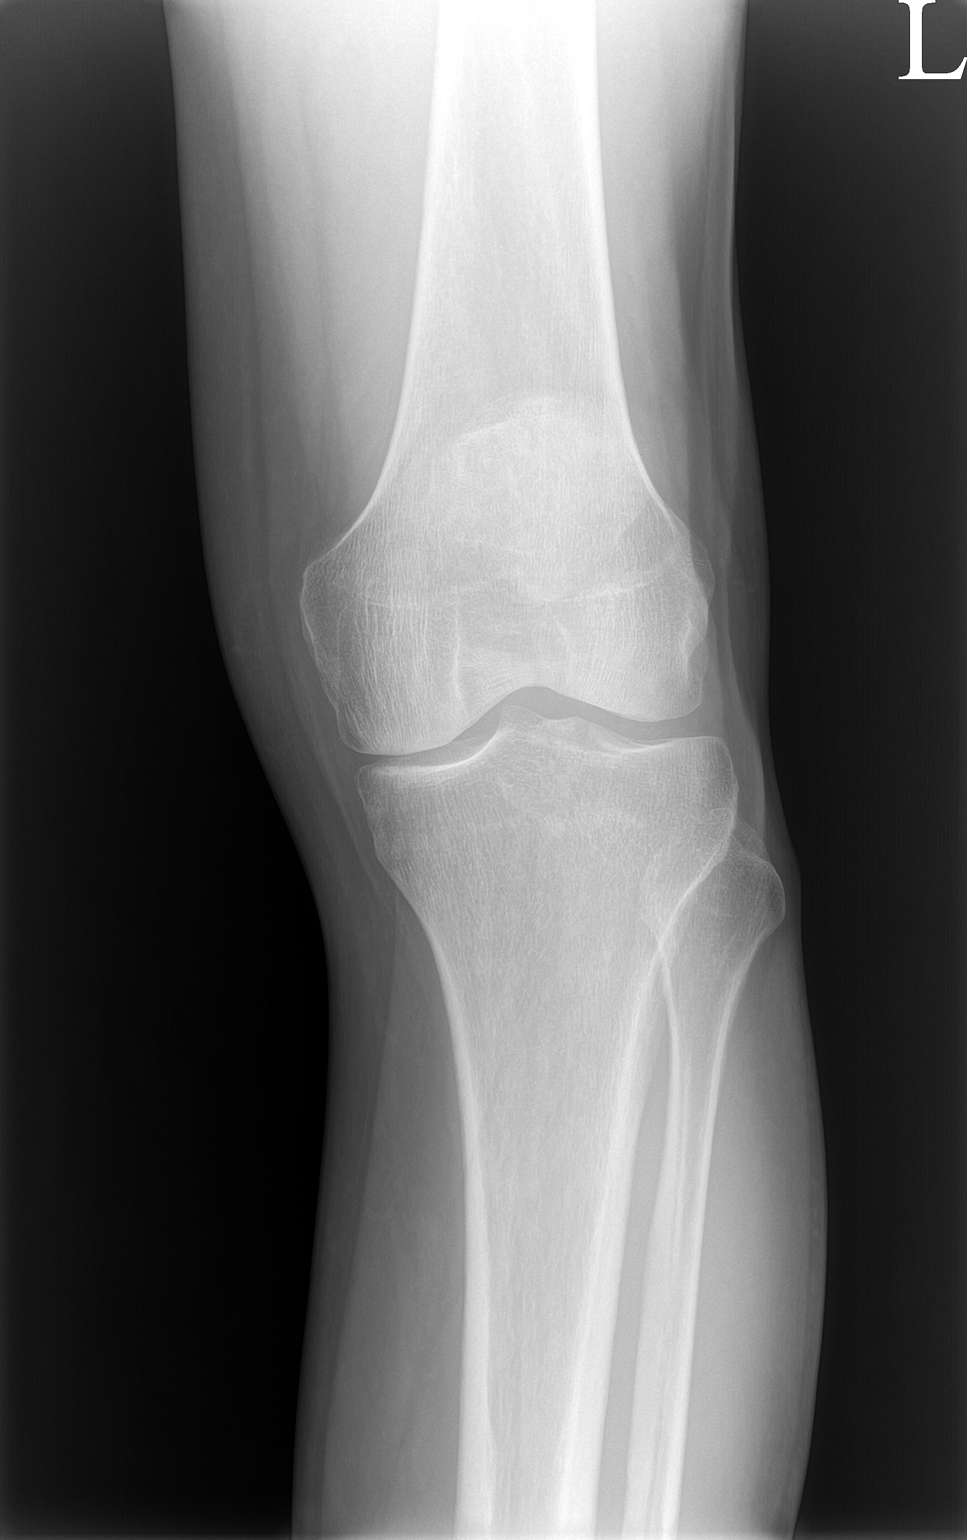

[patella]
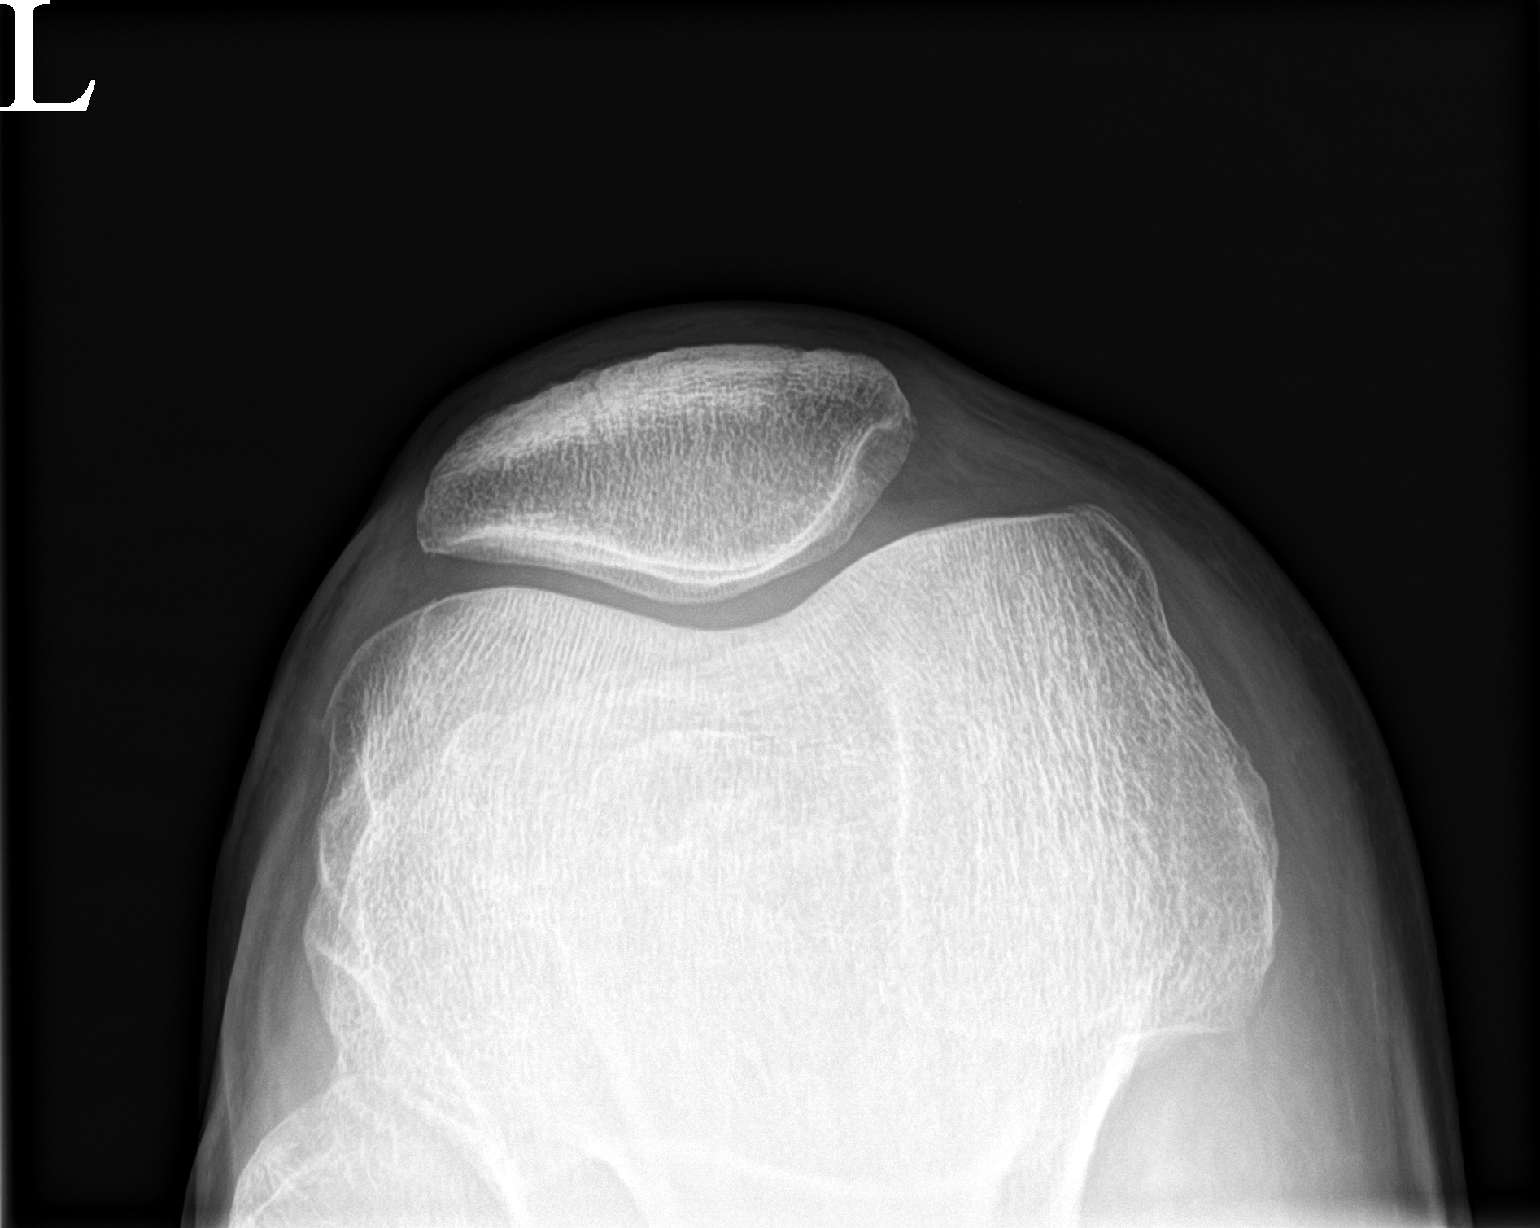

[knee lat]
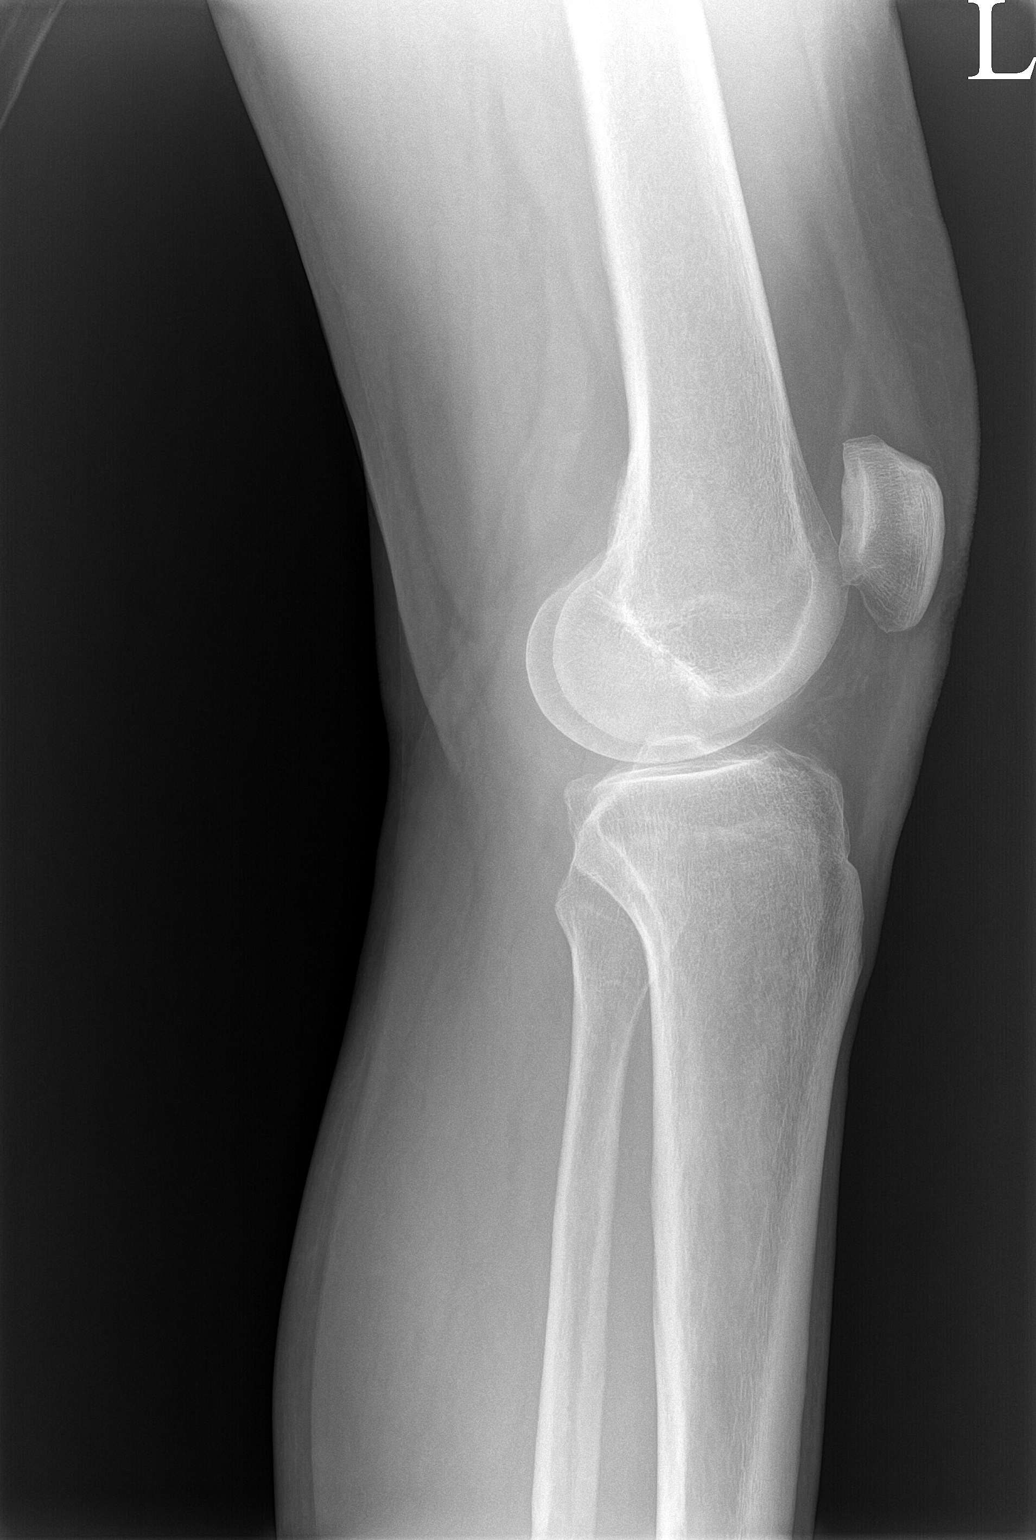

[3 of 3 positions shown; findings below may reference images not displayed]

FINDINGS: No evidence of fracture, dislocation, or joint effusion. No evidence
of arthropathy or other focal bone abnormality. Soft tissues are
unremarkable.
IMPRESSION: Negative.

## 2020-12-04 IMAGING — DX DG ELBOW COMPLETE 3+V*L*
4 series · 4 of 4 positions shown · non-contrast
Comparison: None.

CLINICAL DATA: Left elbow pain. Olecranon pain for a week. No known
injury.

EXAM:
LEFT ELBOW - COMPLETE 3+ VIEW

[elbow ap]
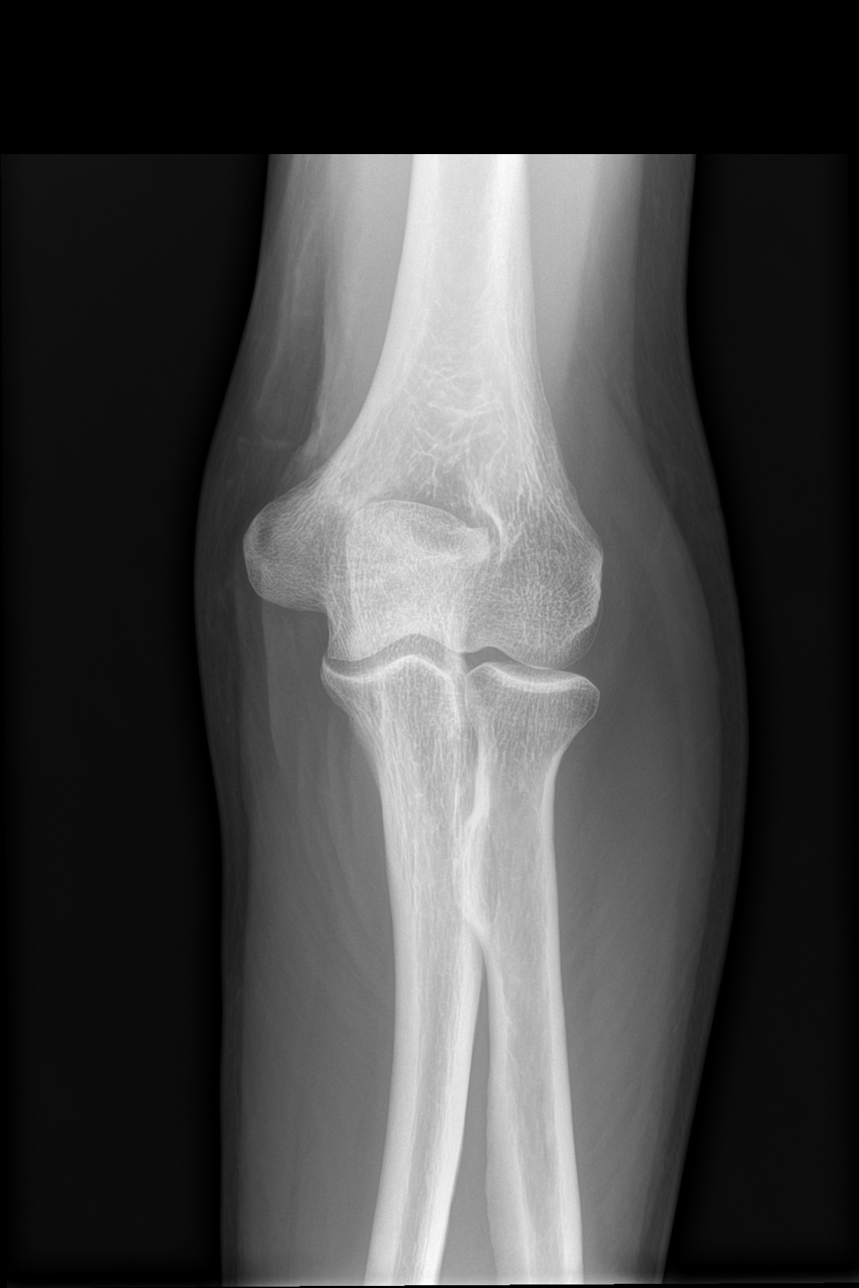

[elbow obl (1 of 2)]
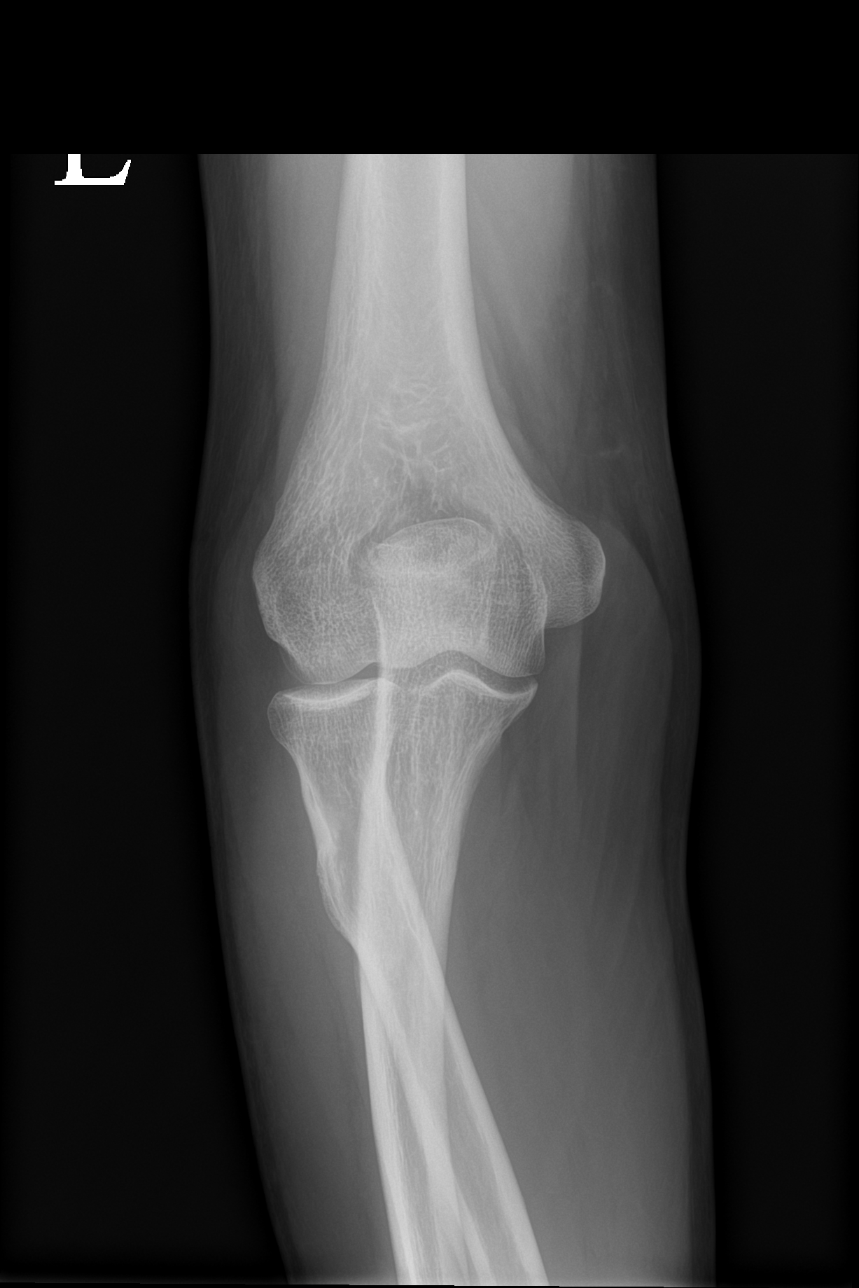

[elbow obl (2 of 2)]
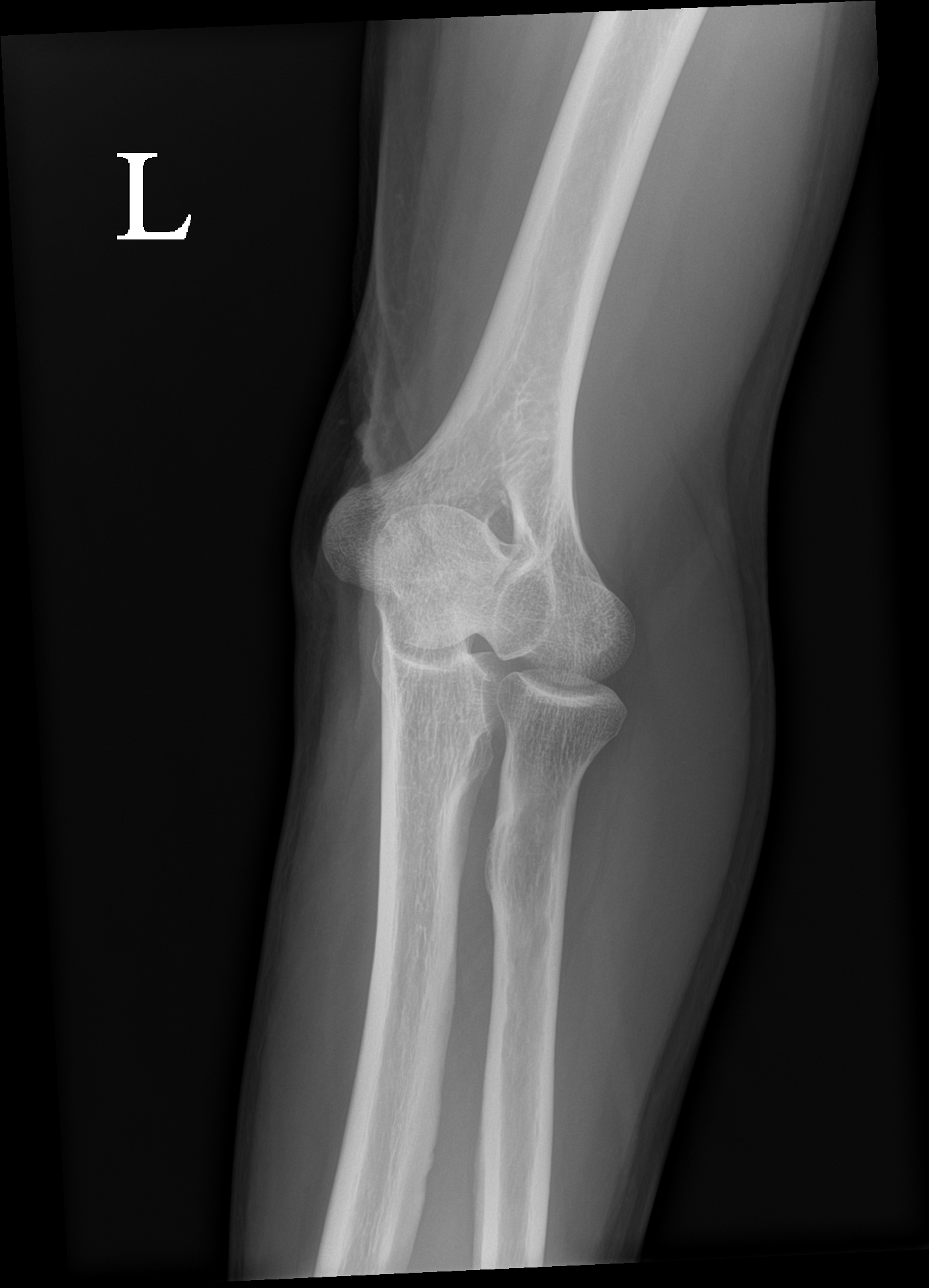

[elbow lat]
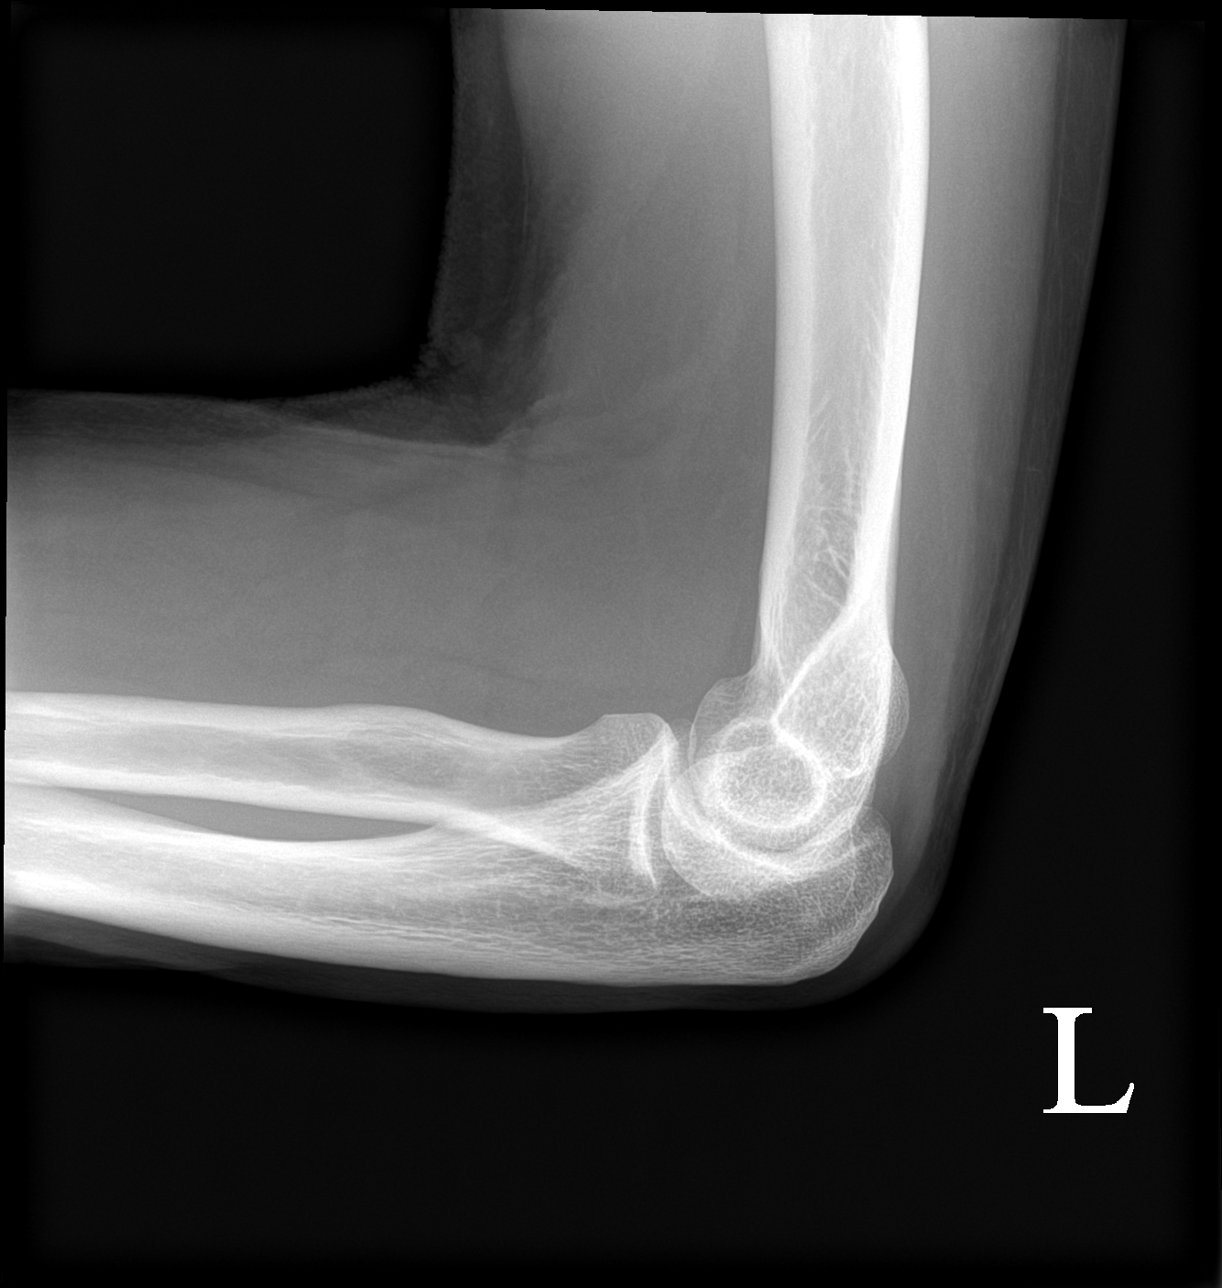

[4 of 4 positions shown; findings below may reference images not displayed]

FINDINGS: There is no evidence of fracture, dislocation, or joint effusion.
There is no evidence of arthropathy or other focal bone abnormality.
Soft tissues are unremarkable.
IMPRESSION: Negative.

## 2020-12-04 MED ORDER — AMBULATORY NON FORMULARY MEDICATION: 1.0000 [IU] | Freq: Once | 0 refills | Status: AC

## 2020-12-04 NOTE — Assessment & Plan Note (Signed)
Patient also given a prescription for an ergonomic evaluation at work to see if any other changes can be made that would be beneficial.

## 2020-12-04 NOTE — Assessment & Plan Note (Signed)
Patient's left knee seems to be worsening and is having more pain.  Having some locking noted.  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  Mild displacement noted.  Trace effusion noted

## 2020-12-04 NOTE — Patient Instructions (Addendum)
Xray today MRI left knee MRI lumbar pelvis ABI bilateral LE Wear the brace for the knee at work  [facility]  ([facility] [employer] [facility]) [address], [address] [place], [place] [address] [phone] Appt: 12/15/2020, 1:00pm but arrive at 12:45pm  We will put in referral for [facility]upuncture Evaluation for ergonomics at work See me again in 4 weeks

## 2020-12-04 NOTE — Assessment & Plan Note (Signed)
Patient does have more of a lateral epicondylitis but did seem to improve initially.  More pain over the olecranon today.  We will get x-rays.  Can hold on imaging.

## 2020-12-04 NOTE — Assessment & Plan Note (Addendum)
Patient continues to deal with this pain for a significant amount of time.  Seems to be still more of the piriformis and the possibility of the greater trochanteric as well.  Patient has had a left hamstring injury as well and does have the chronic low back pain.  Discussed with patient at this point that this has been very long and is failed everything from formal physical therapy, home exercises, as well as medications including muscle relaxers.  At this point I would like to get an imaging including a lumbar and pelvis MRI for further evaluation.  Patient could be a candidate for possible epidurals if this shows in the back or any other etiology that could be contributing. Patient continues to have lower extremity pain as well bilaterally.  We will get ABIs to further evaluate but could be more secondary to the radicular symptoms.

## 2020-12-06 ENCOUNTER — Other Ambulatory Visit: Payer: Self-pay | Admitting: Family Medicine

## 2020-12-11 ENCOUNTER — Other Ambulatory Visit: Payer: Self-pay | Admitting: Family Medicine

## 2020-12-11 DIAGNOSIS — M25562 Pain in left knee: Secondary | ICD-10-CM

## 2020-12-15 ENCOUNTER — Ambulatory Visit (HOSPITAL_COMMUNITY)
Admission: RE | Admit: 2020-12-15 | Discharge: 2020-12-15 | Disposition: A | Discharge status: Home / Self Care | Payer: BC Managed Care – PPO | Source: Ambulatory Visit | Attending: Cardiovascular Disease | Admitting: Cardiovascular Disease

## 2020-12-15 ENCOUNTER — Other Ambulatory Visit: Payer: Self-pay

## 2020-12-15 ENCOUNTER — Other Ambulatory Visit: Payer: Self-pay | Admitting: Family Medicine

## 2020-12-15 ENCOUNTER — Encounter (HOSPITAL_COMMUNITY): Payer: Self-pay

## 2020-12-15 DIAGNOSIS — M25561 Pain in right knee: Secondary | ICD-10-CM | POA: Insufficient documentation

## 2020-12-15 DIAGNOSIS — Z87891 Personal history of nicotine dependence: Secondary | ICD-10-CM | POA: Diagnosis not present

## 2020-12-15 DIAGNOSIS — M25562 Pain in left knee: Secondary | ICD-10-CM | POA: Diagnosis not present

## 2020-12-23 ENCOUNTER — Ambulatory Visit
Admission: RE | Admit: 2020-12-23 | Discharge: 2020-12-23 | Disposition: A | Discharge status: Home / Self Care | Payer: BC Managed Care – PPO | Source: Ambulatory Visit | Attending: Family Medicine | Admitting: Family Medicine

## 2020-12-23 DIAGNOSIS — M545 Low back pain, unspecified: Secondary | ICD-10-CM

## 2020-12-23 DIAGNOSIS — M25551 Pain in right hip: Secondary | ICD-10-CM

## 2020-12-23 DIAGNOSIS — G8929 Other chronic pain: Secondary | ICD-10-CM

## 2020-12-23 DIAGNOSIS — M25562 Pain in left knee: Secondary | ICD-10-CM

## 2020-12-23 IMAGING — MR MR PELVIS W/O CM
5 series · 31 of 48 positions shown · non-contrast
Comparison: None.

CLINICAL DATA: Left hip pain for 3 years.  No known injury.

EXAM:
MRI PELVIS WITHOUT CONTRAST
TECHNIQUE: Multiplanar multisequence MR imaging of the pelvis was performed. No
intravenous contrast was administered.

[Series 3: T2 fat-sat · sagittal · 4.0mm · 0.51mm/px · 8 of 73 slices shown (1 of 2)]
[im 1/73]
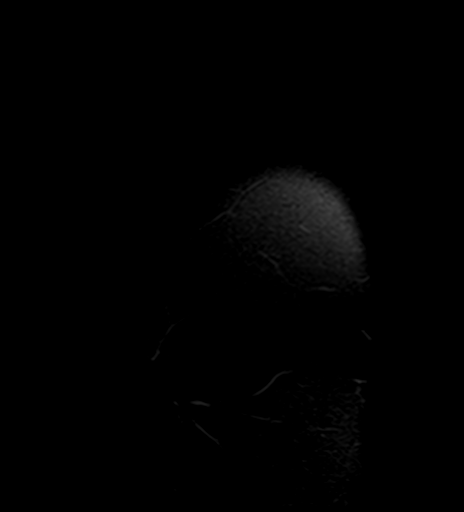
[im 12/73]
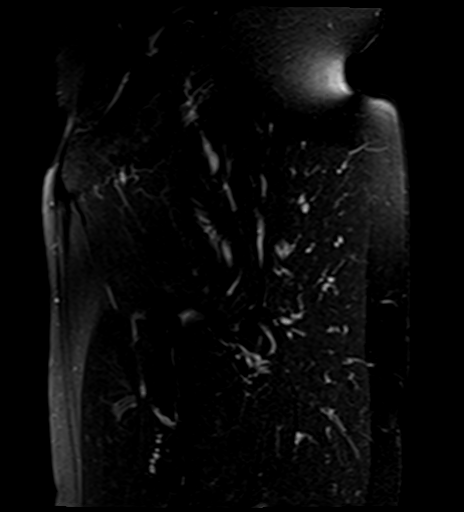
[im 23/73]
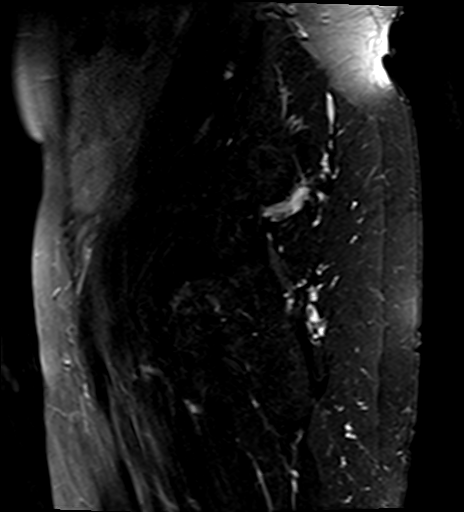
[im 34/73]
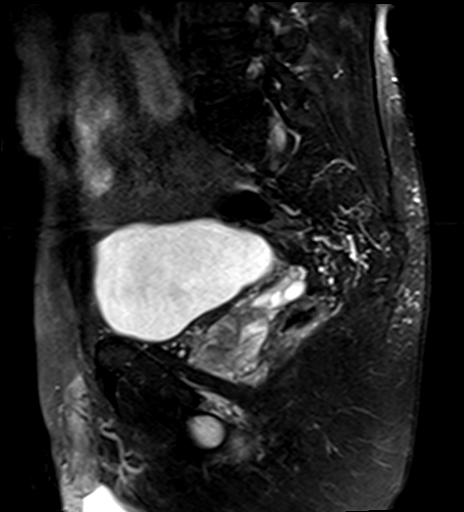
[im 39/73]
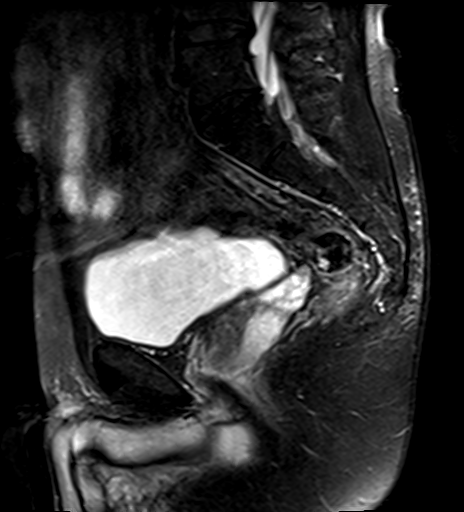
[im 50/73]
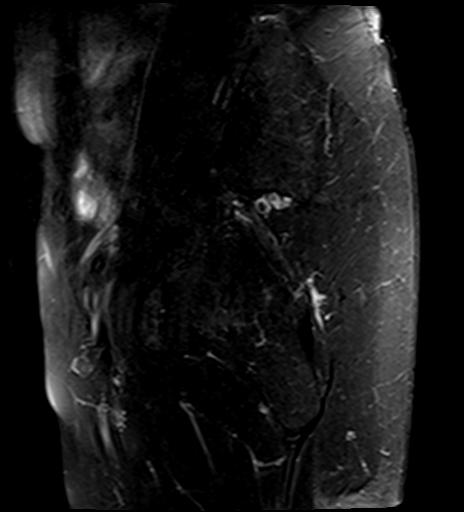
[im 61/73]
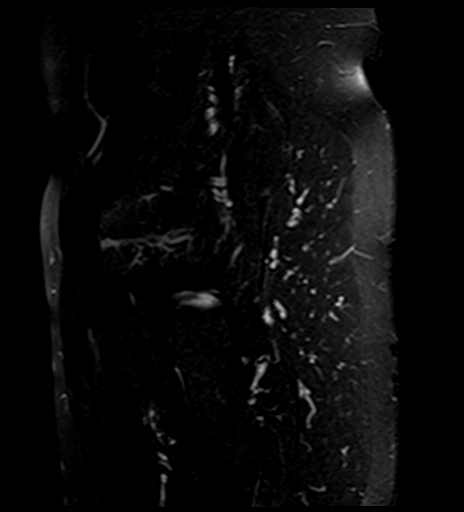
[im 73/73]
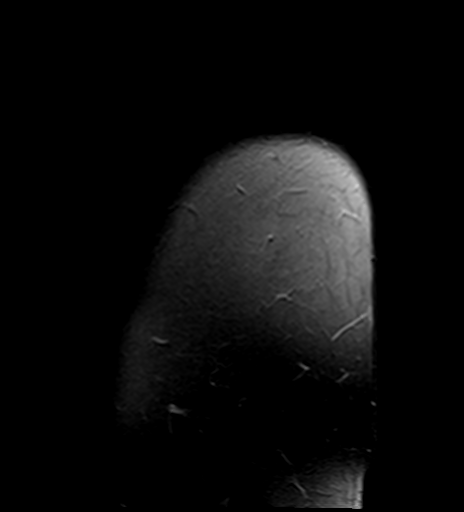

[Series 4: T1 · coronal · 4.0mm · 1.56mm/px · 7 of 36 slices shown (1 of 2)]
[im 1/36]
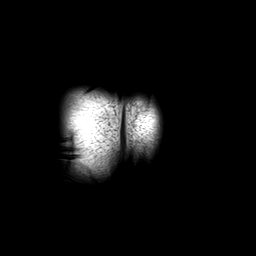
[im 6/36]
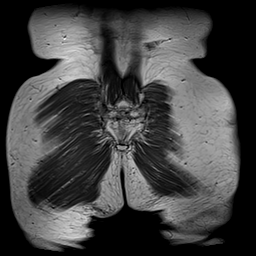
[im 12/36]
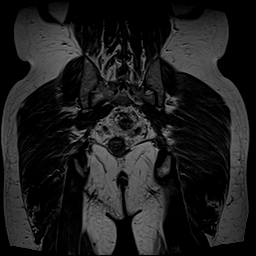
[im 18/36]
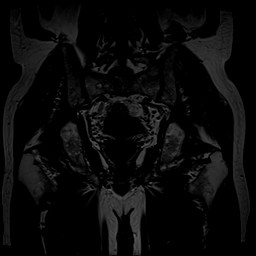
[im 24/36]
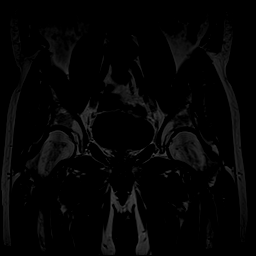
[im 30/36]
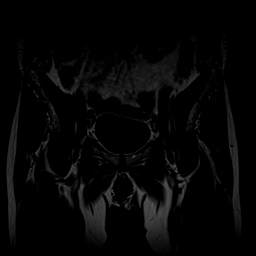
[im 36/36]
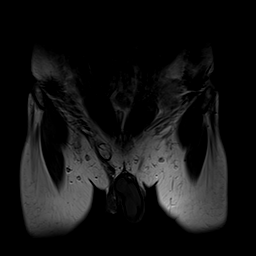

[Series 5: STIR · coronal · 4.0mm · 1.56mm/px · 7 of 36 slices shown]
[im 1/36]
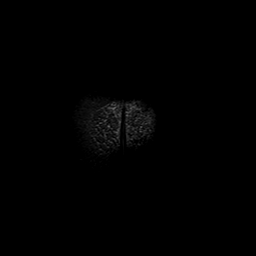
[im 6/36]
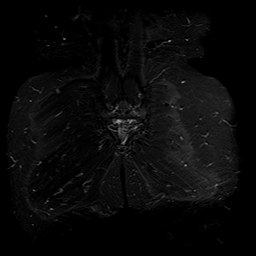
[im 12/36]
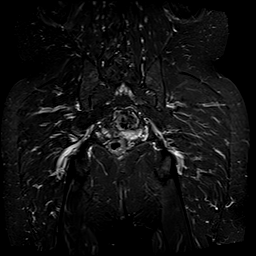
[im 18/36]
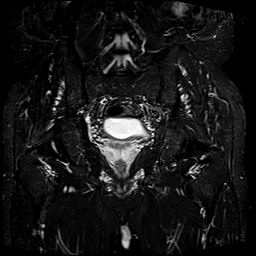
[im 24/36]
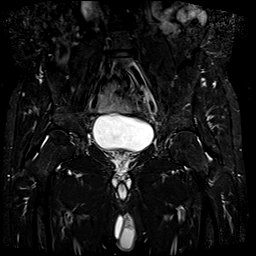
[im 30/36]
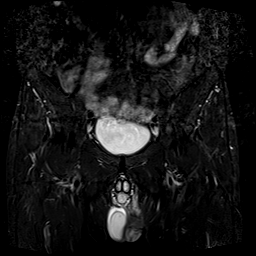
[im 36/36]
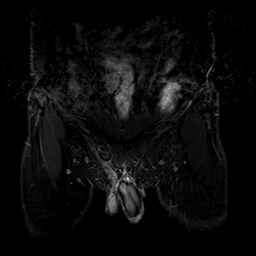

[Series 6: T1 · axial · 4.0mm · 0.68mm/px · z∈[-103,+172]mm · 8 of 56 slices shown (2 of 2)]
[im 1/56]
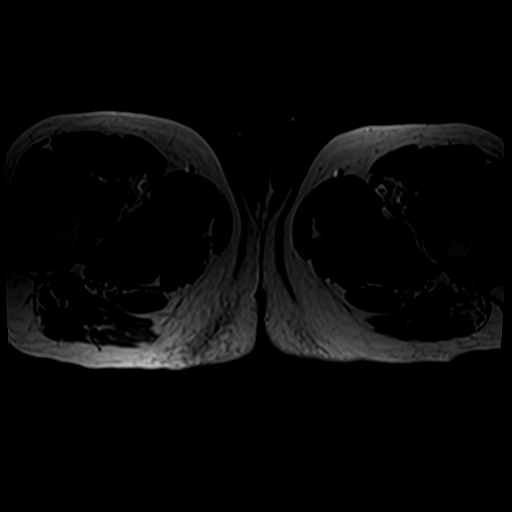
[im 7/56]
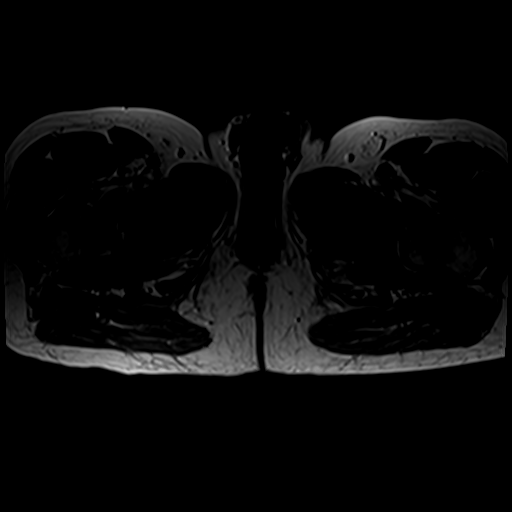
[im 19/56]
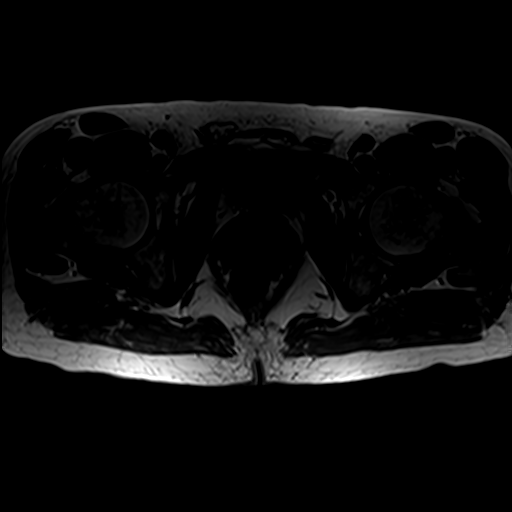
[im 25/56]
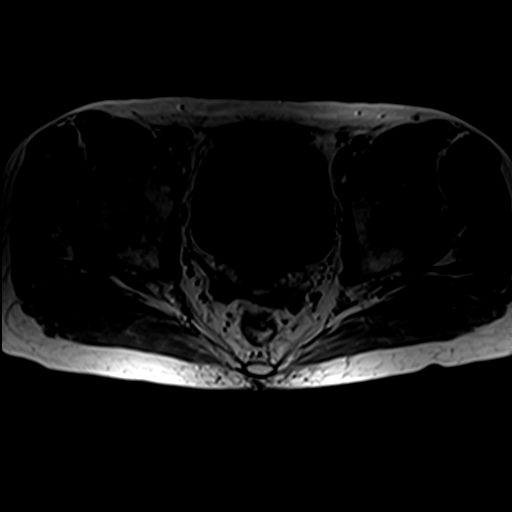
[im 31/56]
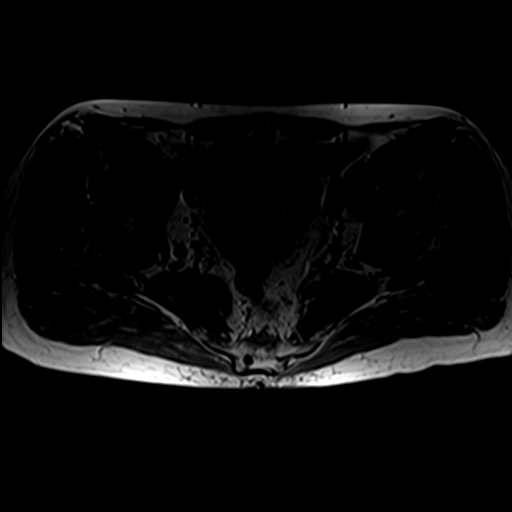
[im 37/56]
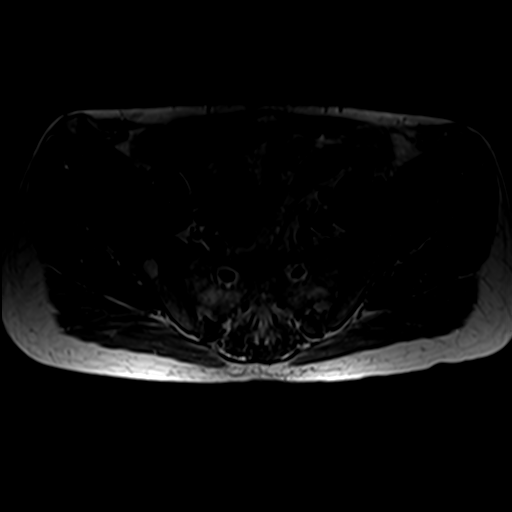
[im 49/56]
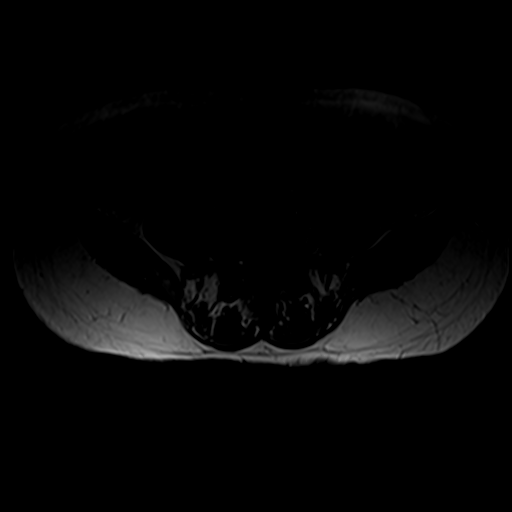
[im 56/56]
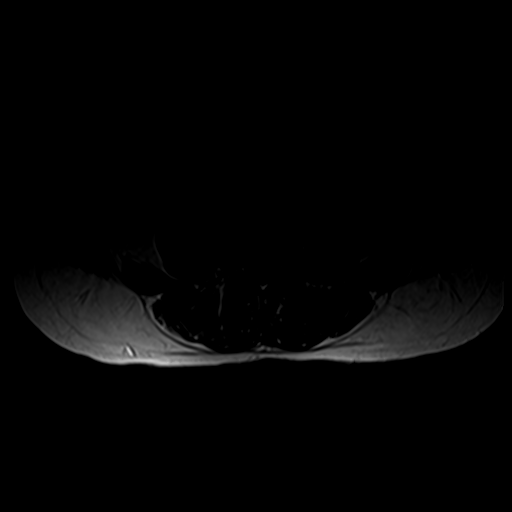

[Series 7: T2 fat-sat · axial · 4.0mm · 1.37mm/px · 1 of 56 slices shown (2 of 2)]
[im 1/56]
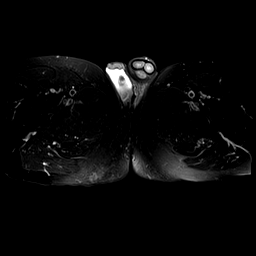

[31 of 48 positions shown; findings below may reference images not displayed]

FINDINGS: Bones/Joint/Cartilage

Marrow signal is normal without fracture, stress change or worrisome
lesion. No subchondral cyst formation or edema about the hips. No
avascular necrosis of the femoral heads. Visualized lower lumbar
spine demonstrates an annular fissure and shallow bulge at L5-S1.

Ligaments

Intact and normal in appearance.

Muscles and Tendons

Intact and normal in appearance.

Soft tissues

Imaged intrapelvic contents demonstrate no acute focal abnormality.
IMPRESSION: Normal appearing pelvis. No finding to explain the patient's
symptoms.

Annular fissure and shallow disc bulge L5-S1 without stenosis.

## 2020-12-23 IMAGING — MR MR LUMBAR SPINE W/O CM
4 of 5 series · 26 of 48 positions shown · non-contrast
Comparison: [DATE] and prior.

CLINICAL DATA: Low back pain, > 6 wks lumbar spine pain

EXAM:
MRI LUMBAR SPINE WITHOUT CONTRAST
TECHNIQUE: Multiplanar, multisequence MR imaging of the lumbar spine was
performed. No intravenous contrast was administered.

[Series 3: T2 · sagittal · 4.0mm · 1.13mm/px · 5 of 17 slices shown (1 of 2)]
[im 1/17]
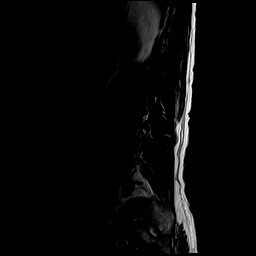
[im 5/17]
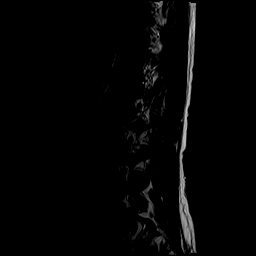
[im 9/17]
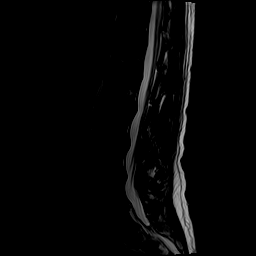
[im 13/17]
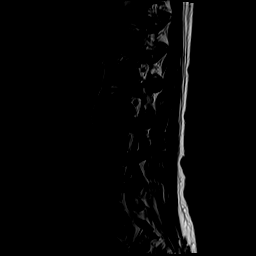
[im 17/17]
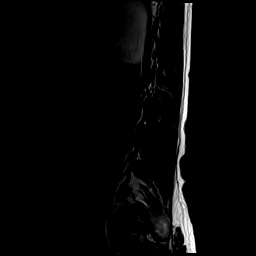

[Series 5: T1 · sagittal · 4.0mm · 1.09mm/px · 6 of 17 slices shown (1 of 2)]
[im 1/17]
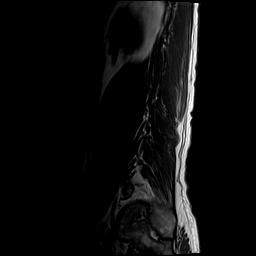
[im 4/17]
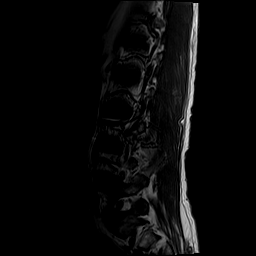
[im 7/17]
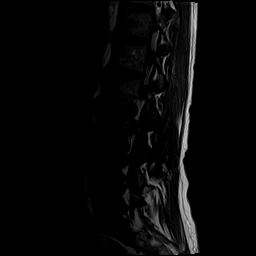
[im 10/17]
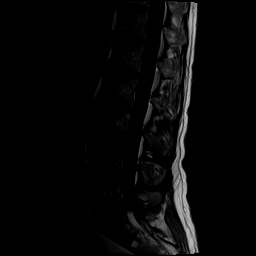
[im 13/17]
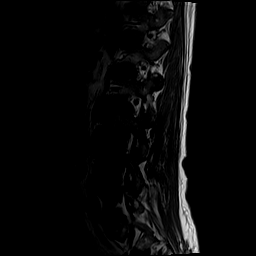
[im 17/17]
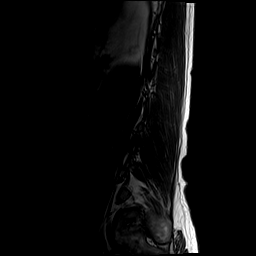

[Series 6: T2 · axial · 4.0mm · 0.39mm/px · z∈[-87,+146]mm · 10 of 48 slices shown (2 of 2)]
[im 4/48]
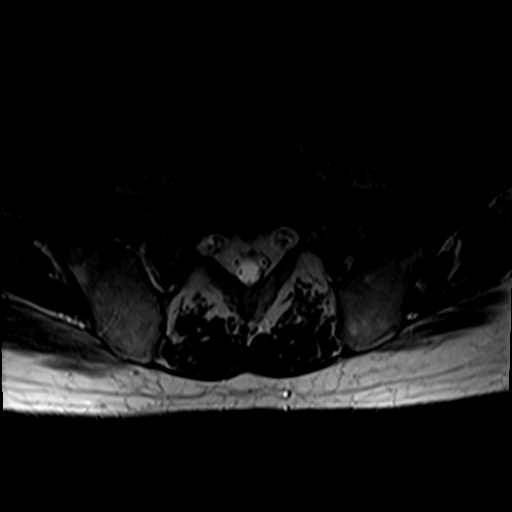
[im 7/48]
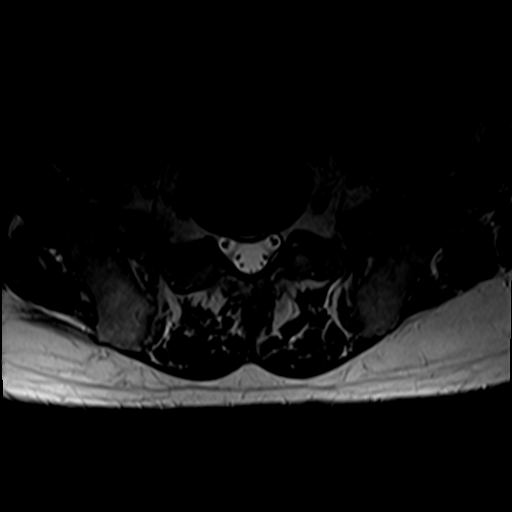
[im 10/48]
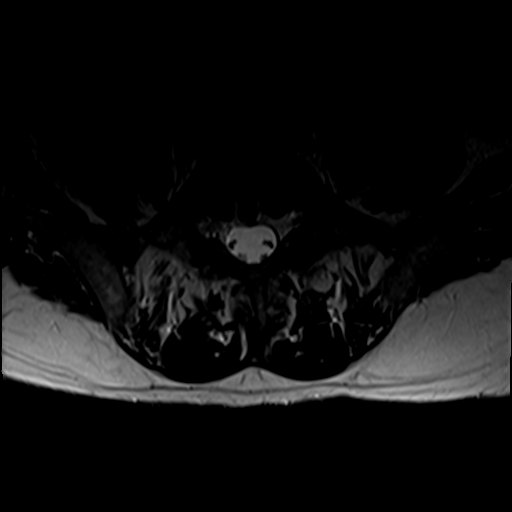
[im 16/48]
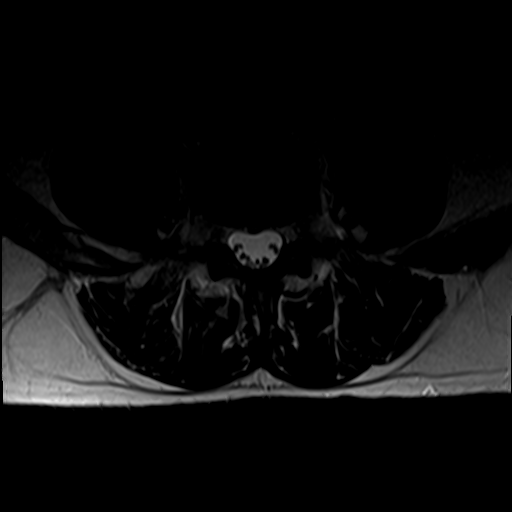
[im 22/48]
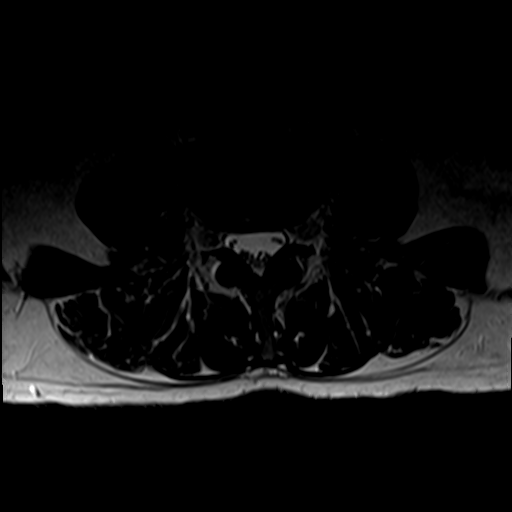
[im 26/48]
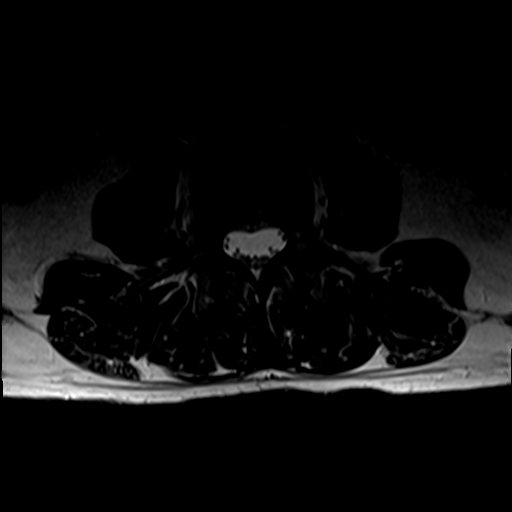
[im 29/48]
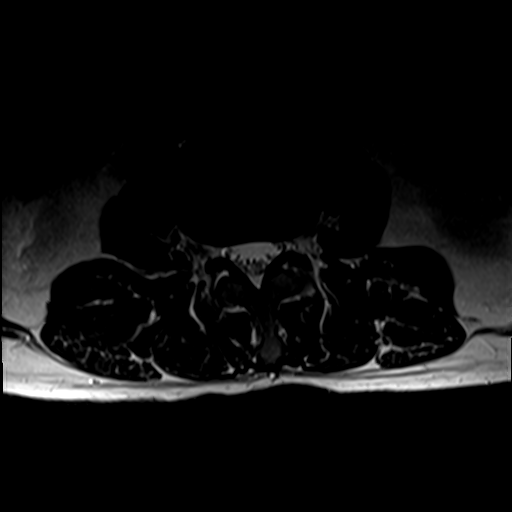
[im 35/48]
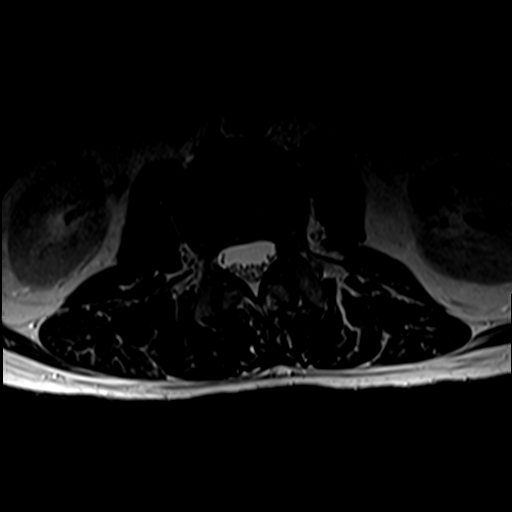
[im 41/48]
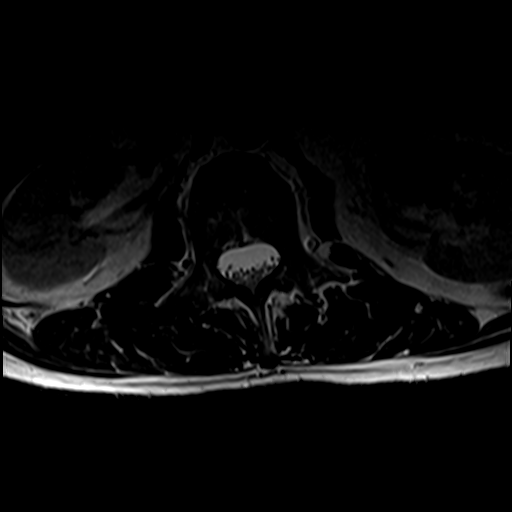
[im 48/48]
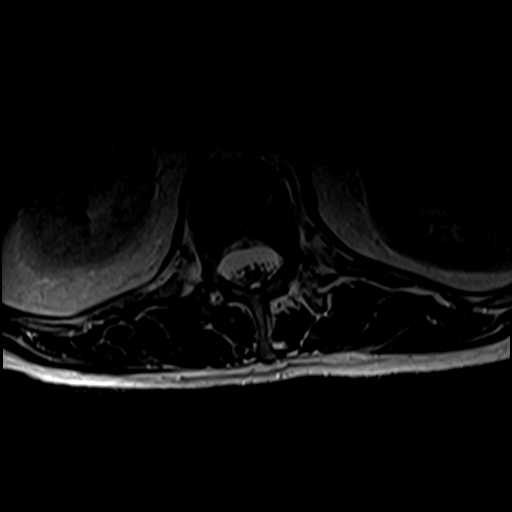

[Series 7: T1 · axial · 4.0mm · 0.39mm/px · z∈[-87,+112]mm · 5 of 48 slices shown (2 of 2)]
[im 4/48]
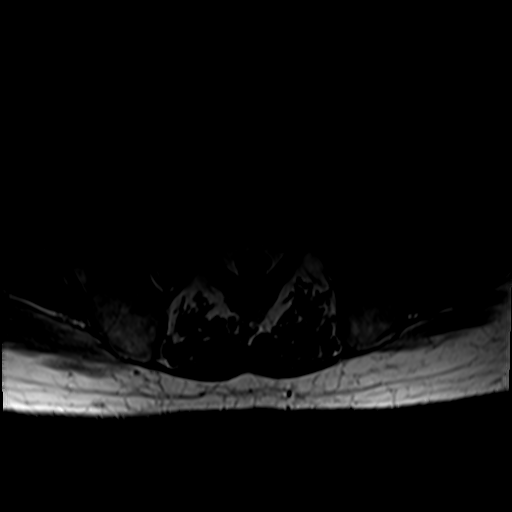
[im 7/48]
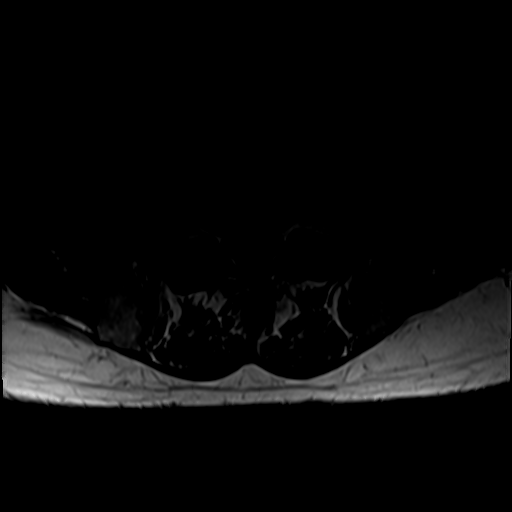
[im 10/48]
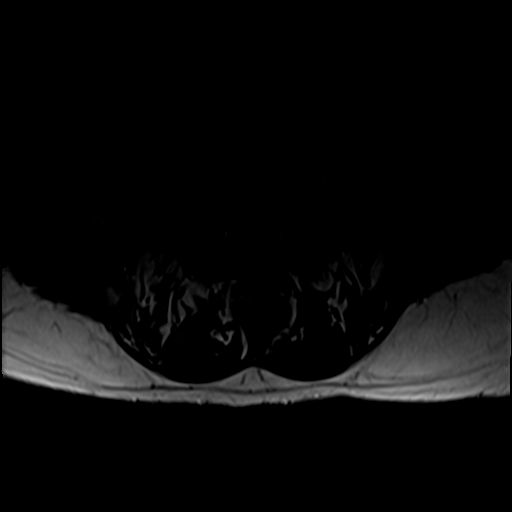
[im 26/48]
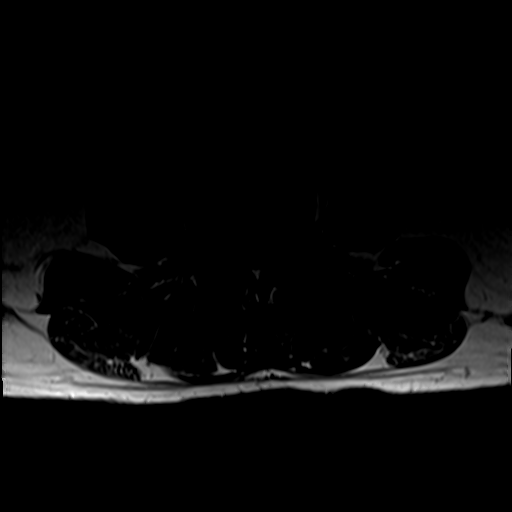
[im 41/48]
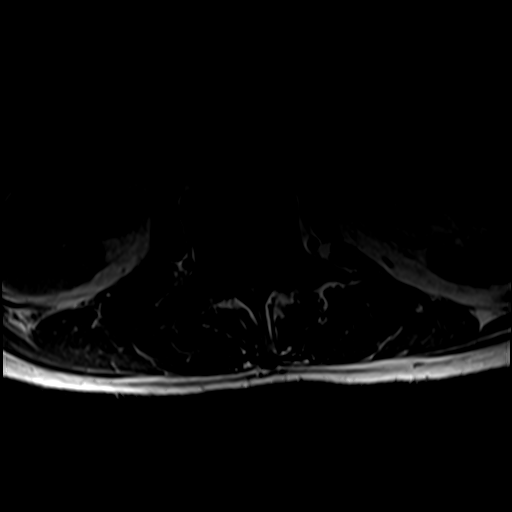

[26 of 48 positions shown; findings below may reference images not displayed]

FINDINGS: Segmentation:  Standard.

Alignment:  Straightening of lordosis.  No listhesis.

Vertebrae: Normal bone marrow signal intensity. Scattered
hemangiomata versus focal fat. No fracture or aggressive osseous
lesion.

Conus medullaris and cauda equina: Conus extends to the L1 level.
Conus and cauda equina appear normal.

Disc levels: Multilevel desiccation.

L1-2: Minimal disc bulge.  Patent spinal canal and neural foramen.

L2-3: Minimal disc bulge.  Patent spinal canal and neural foramen.

L3-4: Minimal disc bulge. Prominent ligamentum flavum. Patent spinal
canal and neural foramen.

L4-5: Mild disc bulge with left foraminal annular fissuring grazing
the exiting L4 nerve root. Prominent ligamentum flavum and facet
degenerative spurring. Small right extraforaminal protrusion. Mild
spinal canal and bilateral neural foraminal narrowing.

L5-S1: Disc bulge with superimposed central protrusion/annular
fissuring grazing the descending S1 nerve roots. Patent spinal canal
and neural foramen.

Paraspinal and other soft tissues: Negative.
IMPRESSION: Mild spondylosis most prominent at the L4-5, L5-S1 levels.

Mild L4-5 spinal canal and bilateral neural foraminal narrowing.
Left foraminal annular fissuring grazing the exiting L4 nerve root.

Central L5-S1 protrusion/annular fissuring abutting the descending
S1 nerve roots. No significant stenosis at this level.

## 2020-12-23 IMAGING — MR MR KNEE*L* W/O CM
4 of 5 series · 21 of 40 positions shown · non-contrast
Comparison: Plain films left knee [DATE].

CLINICAL DATA: Medial left knee pain for 3 years. The patient
reports a history of meniscal tear. No history of surgery.

EXAM:
MRI OF THE LEFT KNEE WITHOUT CONTRAST
TECHNIQUE: Multiplanar, multisequence MR imaging of the knee was performed. No
intravenous contrast was administered.

[Series 3: T2 fat-sat · axial · 4.0mm · 0.62mm/px · z∈[-79,+21]mm · 3 of 29 slices shown (1 of 2)]
[im 5/29]
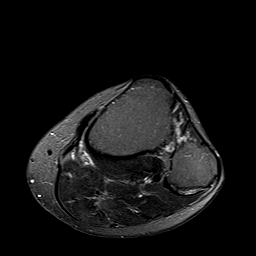
[im 17/29]
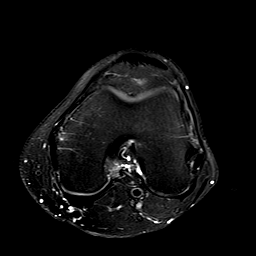
[im 25/29]
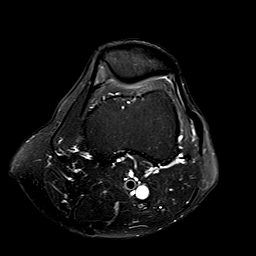

[Series 5: T2 fat-sat · coronal · 4.0mm · 0.29mm/px · 3 of 22 slices shown (2 of 2)]
[im 4/22]
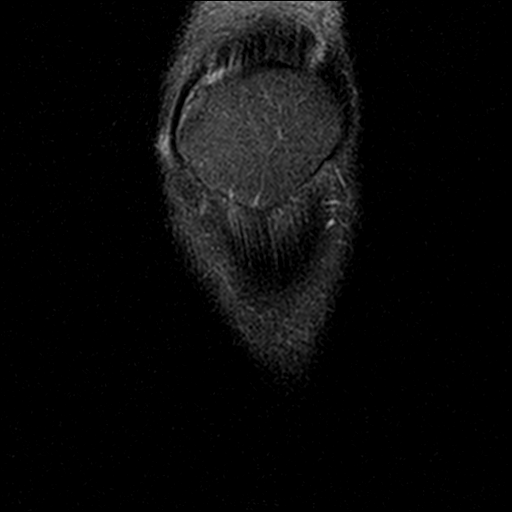
[im 11/22]
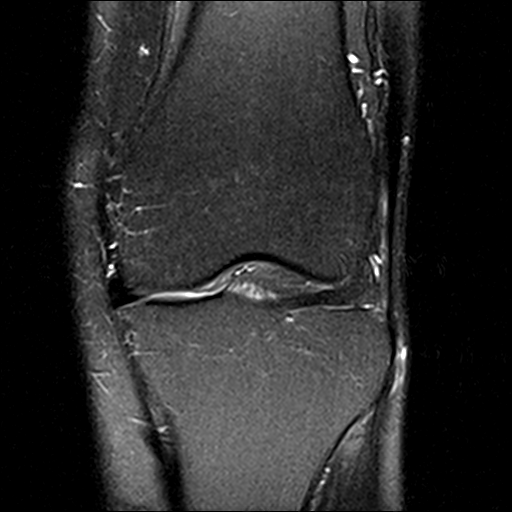
[im 18/22]
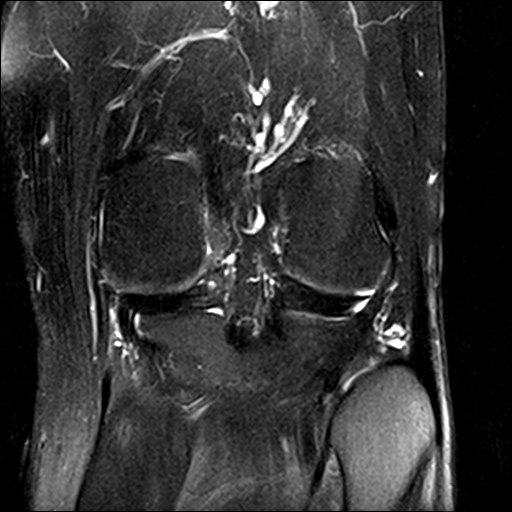

[Series 7: PD fat-sat · sagittal · 3.0mm · 0.29mm/px · 9 of 30 slices shown (1 of 2)]
[im 1/30]
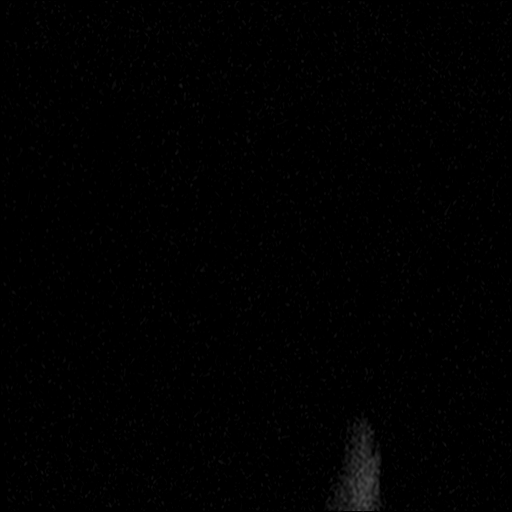
[im 4/30]
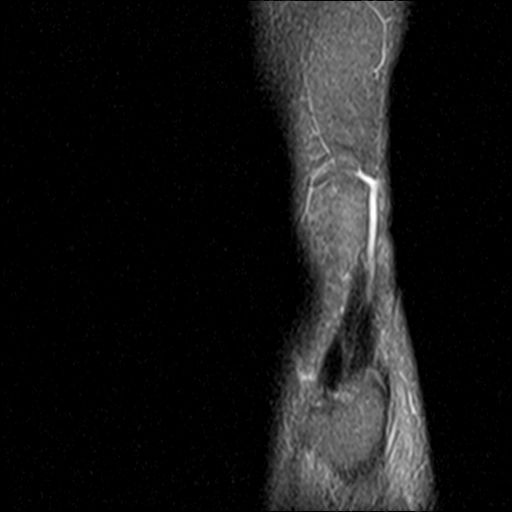
[im 8/30]
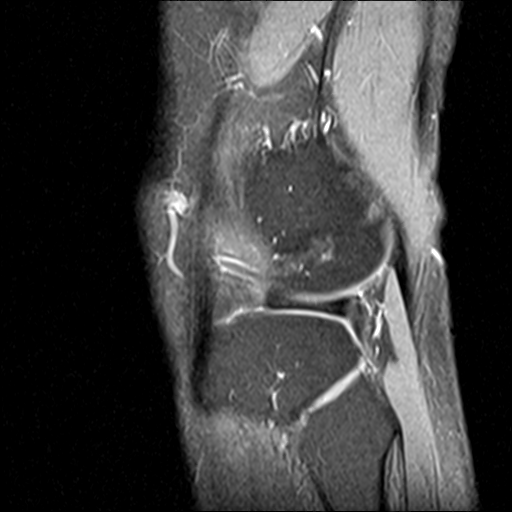
[im 11/30]
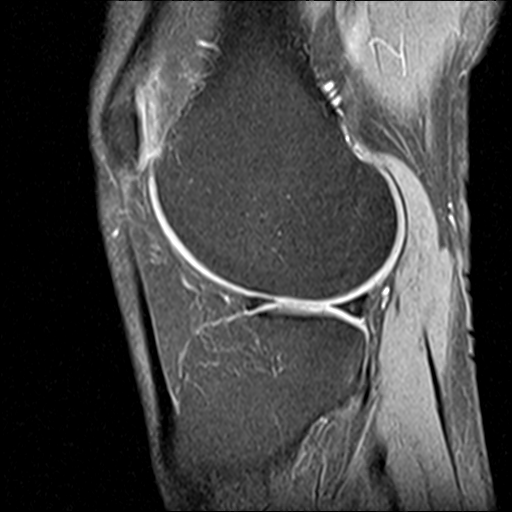
[im 15/30]
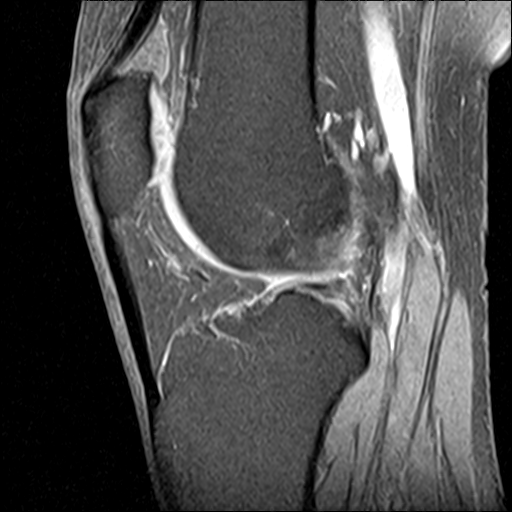
[im 19/30]
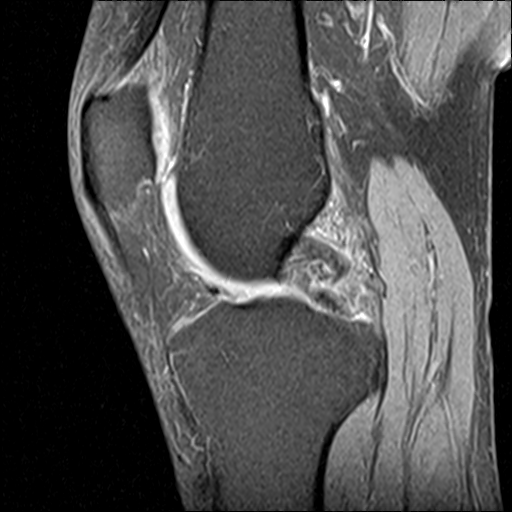
[im 22/30]
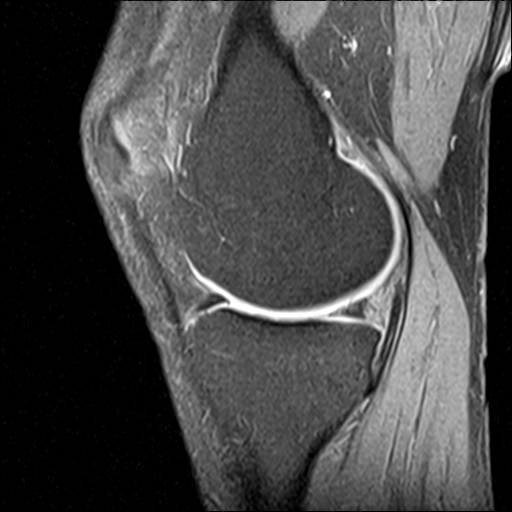
[im 26/30]
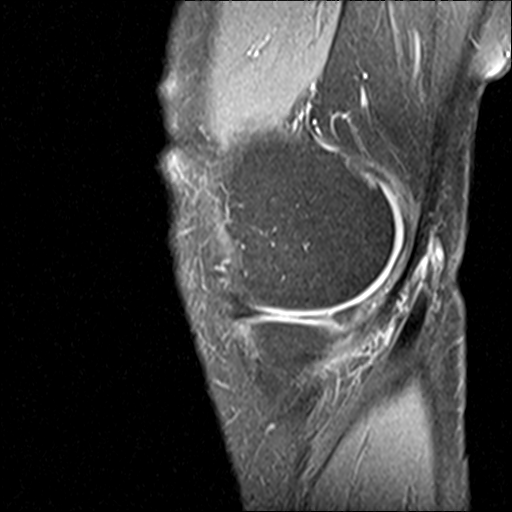
[im 30/30]
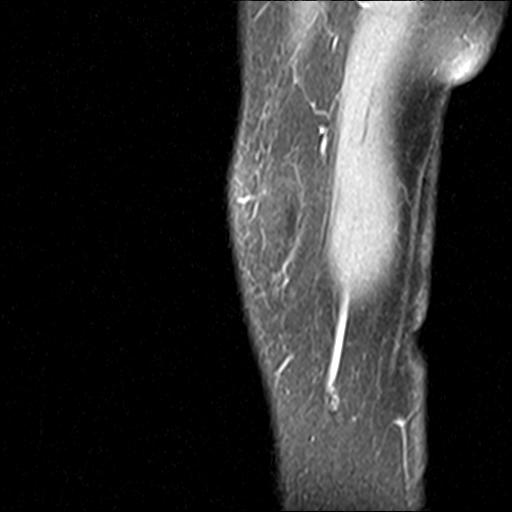

[Series 8: PD fat-sat · coronal · 3.0mm · 0.29mm/px · 6 of 28 slices shown (2 of 2)]
[im 1/28]
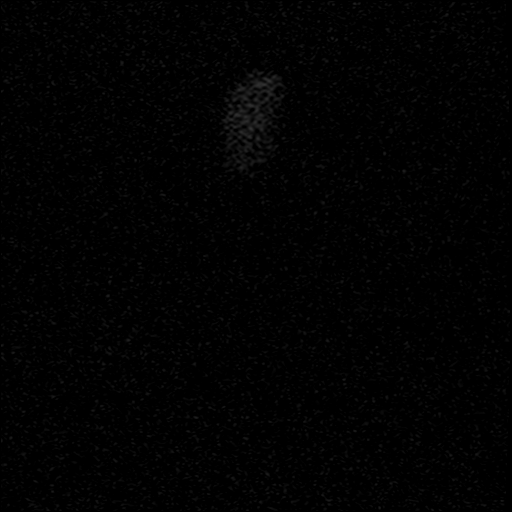
[im 4/28]
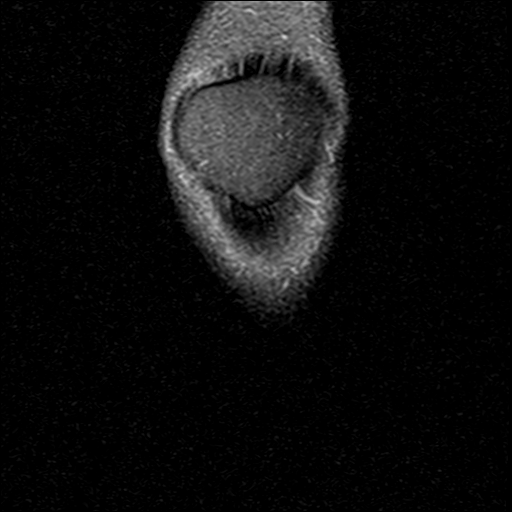
[im 7/28]
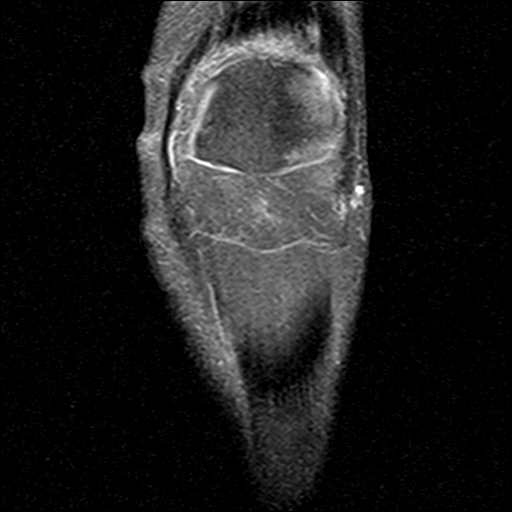
[im 11/28]
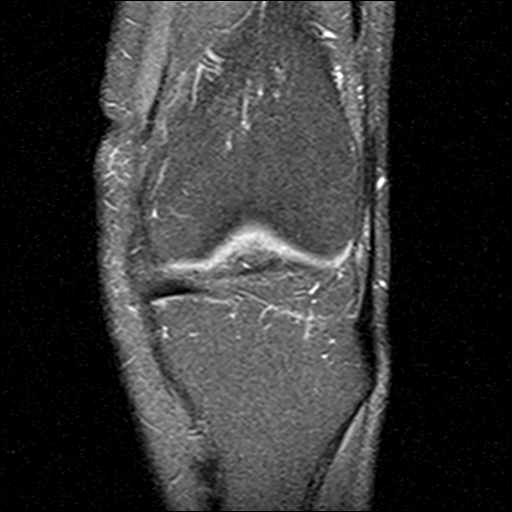
[im 14/28]
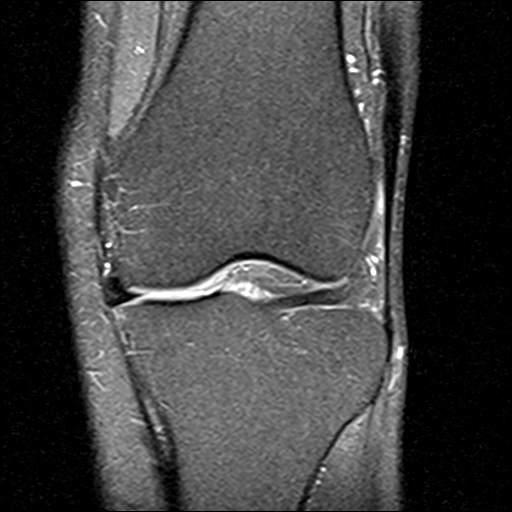
[im 24/28]
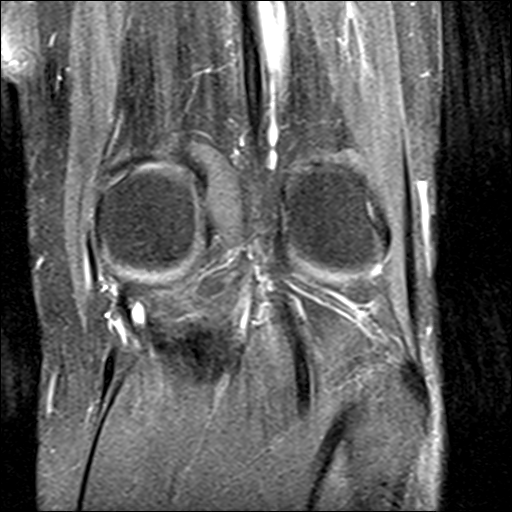

[21 of 40 positions shown; findings below may reference images not displayed]

FINDINGS: MENISCI

Medial meniscus: A horizontal tear in the posterior horn and
posterior body reaches the meniscal undersurface. The body is
degenerated and diminutive.

Lateral meniscus:  Intact.

LIGAMENTS

Cruciates:  Intact.

Collaterals:  Intact.

CARTILAGE

Patellofemoral:  Mildly frayed and irregular without focal defect.

Medial: Moderately frayed and irregular without focal defect. Small
focus of secondary subchondral edema in the medial tibial plateau
noted.

Lateral:  Mildly degenerated.

Joint:  No effusion.

Popliteal Fossa:  No Baker's cyst.

Extensor Mechanism:  Intact.

Bones:  No fracture or worrisome lesion.

Other: None.
IMPRESSION: Horizontal tear posterior horn and body of the medial meniscus. The
body of the medial meniscus is markedly degenerated and diminutive.

Mild-to-moderate medial worse than patellofemoral osteoarthritis.

## 2020-12-31 NOTE — Progress Notes (Signed)
[facility] [place] Phone: [phone] Subjective:   [name], am serving as a scribe for Dr. Hulan Saas. This visit occurred during the SARS-CoV-2 public health emergency.  Safety protocols were in place, including screening questions prior to the visit, additional usage of staff PPE, and extensive cleaning of exam room while observing appropriate contact time as indicated for disinfecting solutions.   I'm seeing this patient by the request  of:  Denita Lung, MD  CC: Follow-up on multiple problems  [id number]   12/04/2020 Patient continues to deal with this pain for a significant amount of time.  Seems to be still more of the piriformis and the possibility of the greater trochanteric as well.  Patient has had a left hamstring injury as well and does have the chronic low back pain.  Discussed with patient at this point that this has been very long and is failed everything from formal physical therapy, home exercises, as well as medications including muscle relaxers.  At this point I would like to get an imaging including a lumbar and pelvis MRI for further evaluation.  Patient could be a candidate for possible epidurals if this shows in the back or any other etiology that could be contributing. Patient continues to have lower extremity pain as well bilaterally.  We will get ABIs to further evaluate but could be more secondary to the radicular symptoms.  Patient does have more of a lateral epicondylitis but did seem to improve initially.  More pain over the olecranon today.  We will get x-rays.  Can hold on imaging.  Patient's left knee seems to be worsening and is having more pain.  Having some locking noted.  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  Mild displacement noted.  Trace effusion noted  Update 01/01/2021 DRAGO HAMMONDS is a 60 y.o. male coming in with  complaint of left piriformis, left elbow and left knee pain. Patient states that L knee pain is unchanged. R knee is now bothering him.   L elbow pain has improved. R elbow bothers over tricep insertion. Uses compression sleeve on each elbow while working.   MRI lumbar spine 12/24/2020 IMPRESSION: Mild spondylosis most prominent at the L4-5, L5-S1 levels.  Mild L4-5 spinal canal and bilateral neural foraminal narrowing. Left foraminal annular fissuring grazing the exiting L4 nerve root.  Central L5-S1 protrusion/annular fissuring abutting the descending S1 nerve roots. No significant stenosis at this level.   MRI pelvis 12/24/2020 IMPRESSION: Normal appearing pelvis. No finding to explain the patient's symptoms.  Annular fissure and shallow disc bulge L5-S1 without stenosis.  MRI L knee 12/23/2020 IMPRESSION: Horizontal tear posterior horn and body of the medial meniscus. The body of the medial meniscus is markedly degenerated and diminutive.  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  Muscle relaxer is helping his pain and to sleep at night.    MRI of the left knee show the patient did have a horizontal tear of the posterior horn of the medial meniscus with moderate arthritic changes of the patellofemoral joint  MRI of the pelvis was unremarkable  MRI of the lumbar showed the patient did have a central disc protrusion at L5-S1 causing impingement on bilateral S1 nerve roots.  This likely would contribute to some of the hamstring and gluteal pain that patient has.  Past Medical History:  Diagnosis Date  . Anemia   . Central  serous retinopathy    hx/o intraocular injection therapy for 3 years; no change after 3 years of therapy, no visual c/o  . Depression   . Former smoker   . History of blood transfusion 04/17/2017   "related to anemia"  . History of kidney stones   . Hypertension   . Left varicocele   . Port-wine stain of face   .  Pre-diabetes    Past Surgical History:  Procedure Laterality Date  . COLONOSCOPY     never  . EXTRACORPOREAL SHOCK WAVE LITHOTRIPSY    . PALATE / UVULA BIOPSY / EXCISION  2006   "polyp on uvula cut out"   Social History   Socioeconomic History  . Marital status: Divorced    Spouse name: Not on file  . Number of children: Not on file  . Years of education: Not on file  . Highest education level: Not on file  Occupational History  . Not on file  Tobacco Use  . Smoking status: Former Smoker    Packs/day: 1.00    Years: 15.00    Pack years: 15.00    Types: Cigarettes    Quit date: 12/01/1997    Years since quitting: 23.1  . Smokeless tobacco: Never Used  Vaping Use  . Vaping Use: Never used  Substance and Sexual Activity  . Alcohol use: Yes    Comment: Beer.  . Drug use: No  . Sexual activity: Not Currently  Other Topics Concern  . Not on file  Social History Narrative   Divorced 04/2012, 2 children ages daughter 39yo, son 64yo; exercise - moving on the job, walking, works as Electrical engineer   Social Determinants of Radio broadcast assistant Strain: Not on file  Food Insecurity: Not on file  Transportation Needs: Not on file  Physical Activity: Not on file  Stress: Not on file  Social Connections: Not on file   Allergies  Allergen Reactions  . Penicillins Other (See Comments)    Hands & feet peel. "childhood allergy"   Family History  Problem Relation Age of Onset  . Heart disease Mother        pacemaker  . Heart disease Father 3       died of MI, 12s  . Diabetes Father   . Stroke Father   . Migraines Sister   . Mental illness Sister   . Cancer Neg Hx   . Hyperlipidemia Neg Hx   . Hypertension Neg Hx   . Colon cancer Neg Hx      Current Outpatient Medications (Cardiovascular):  .  amLODipine (NORVASC) 5 MG tablet, Take 1 tablet (5 mg total) by mouth daily. [name]  lisinopril-hydrochlorothiazide (ZESTORETIC) 20-12.5 MG tablet, Take 1 tablet by mouth  daily.     Current Outpatient Medications (Other):  [name]  DULoxetine (CYMBALTA) 20 MG capsule, Take 1 capsule (20 mg total) by mouth daily. [name]  tiZANidine (ZANAFLEX) 2 MG tablet, TAKE 1 TABLET BY MOUTH EVERYDAY AT BEDTIME   Reviewed prior external information including notes and imaging from  primary care provider As well as notes that were available from care everywhere and other healthcare systems.  Past medical history, social, surgical and family history all reviewed in electronic medical record.  No pertanent information unless stated regarding to the chief complaint.   Review of Systems:  No headache, visual changes, nausea, vomiting, diarrhea, constipation, dizziness, abdominal pain, skin rash, fevers, chills, night sweats, weight loss, swollen lymph nodes, body aches, joint swelling,  chest pain, shortness of breath. POSITIVE muscle aches, body aches, mood changes  Objective  Blood pressure 124/86, pulse 67, height 6\' 3"  (1.905 m), weight 186 lb (84.4 kg), SpO2 98 %.   General: No apparent distress alert and oriented x3 mood and affect normal, dressed appropriately.  HEENT: Pupils equal, extraocular movements intact  Respiratory: Patient's speak in full sentences and does not appear short of breath  Cardiovascular: No lower extremity edema, non tender, no erythema  Gait normal with good balance and coordination.  MSK: Left knee shows the patient does have a positive McMurray's.  Patient does have tenderness to palpation on the medial aspect of the knee. Patient with low back does have some tenderness to palpation in the paraspinal musculature.  Still has some tightness noted in the gluteal area and the hamstrings left greater than right.  Mild positive tightness of the left hamstring with maybe mild radicular symptoms with straight leg test.   Impression and Recommendations:     The above documentation has been reviewed and is accurate and complete Lyndal Pulley, DO

## 2021-01-01 ENCOUNTER — Encounter: Payer: Self-pay | Admitting: Family Medicine

## 2021-01-01 ENCOUNTER — Other Ambulatory Visit: Payer: Self-pay

## 2021-01-01 ENCOUNTER — Ambulatory Visit: Payer: BC Managed Care – PPO | Admitting: Family Medicine

## 2021-01-01 VITALS — BP 124/86 | HR 67 | Ht 75.0 in | Wt 186.0 lb

## 2021-01-01 DIAGNOSIS — S3422XA Injury of nerve root of sacral spine, initial encounter: Secondary | ICD-10-CM | POA: Insufficient documentation

## 2021-01-01 DIAGNOSIS — M25562 Pain in left knee: Secondary | ICD-10-CM

## 2021-01-01 MED ORDER — DULOXETINE HCL 20 MG PO CPEP: 20.0000 mg | ORAL_CAPSULE | Freq: Every day | ORAL | 0 refills | Status: DC

## 2021-01-01 NOTE — Assessment & Plan Note (Signed)
Of left knee pain.  Patient's MRI did show that patient does have a meniscal tear that seems to be horizontal and patient is having internal derangement of the knee.  At this point we will refer to orthopedic to discuss arthroscopic care.  Patient will have FMLA paperwork filled out.  In addition to all the patient's other aches and pains will start him on Cymbalta to help with some of the neurologic component.  Follow-up with me again in 4 weeks

## 2021-01-01 NOTE — Assessment & Plan Note (Signed)
Patient's MRI does show the patient does have an S1 nerve impingement.  Patient wants to hold on the epidural and feels that some of his back pain is more secondary to the knee.  Patient will be referred to orthopedic surgery to discuss the possibility of arthroscopic procedure of that first.  In addition to this patient given FMLA forms that we will fill out to discuss the possibility of exacerbations during his work with the knee until he gets that taken care of.  Started on a low-dose of Cymbalta to see how patient responds.  Patient does seem to be somewhat more anxious and frustrated with all his ailments.

## 2021-01-01 NOTE — Patient Instructions (Signed)
Cymbalta 20mg  [place] [facility] or [facility] Let's check in again in 4 weeks- 30 min please

## 2021-01-10 ENCOUNTER — Other Ambulatory Visit: Payer: Self-pay | Admitting: Family Medicine

## 2021-01-23 ENCOUNTER — Other Ambulatory Visit: Payer: Self-pay | Admitting: Family Medicine

## 2021-01-25 ENCOUNTER — Other Ambulatory Visit: Payer: Self-pay

## 2021-01-25 MED ORDER — DULOXETINE HCL 20 MG PO CPEP: 20.0000 mg | ORAL_CAPSULE | Freq: Every day | ORAL | 0 refills | Status: DC

## 2021-02-03 ENCOUNTER — Other Ambulatory Visit: Payer: Self-pay | Admitting: Family Medicine

## 2021-02-04 NOTE — Progress Notes (Signed)
[place] Phone: [phone] Subjective:   [name], am serving as a scribe for Dr. Hulan Saas. This visit occurred during the SARS-CoV-2 public health emergency.  Safety protocols were in place, including screening questions prior to the visit, additional usage of staff PPE, and extensive cleaning of exam room while observing appropriate contact time as indicated for disinfecting solutions.   I'm seeing this patient by the request  of:  Denita Lung, MD  CC: Back pain, knee pain follow-up  [id number]   01/01/2021 Patient's MRI does show the patient does have an S1 nerve impingement.  Patient wants to hold on the epidural and feels that some of his back pain is more secondary to the knee.  Patient will be referred to orthopedic surgery to discuss the possibility of arthroscopic procedure of that first.  In addition to this patient given FMLA forms that we will fill out to discuss the possibility of exacerbations during his work with the knee until he gets that taken care of.  Started on a low-dose of Cymbalta to see how patient responds.  Patient does seem to be somewhat more anxious and frustrated with all his ailments.  Of left knee pain.  Patient's MRI did show that patient does have a meniscal tear that seems to be horizontal and patient is having internal derangement of the knee.  At this point we will refer to orthopedic to discuss arthroscopic care.  Patient will have FMLA paperwork filled out.  In addition to all the patient's other aches and pains will start him on Cymbalta to help with some of the neurologic component.  Follow-up with me again in 4 weeks  Update 02/05/2021 HARPER SMOKER is a 60 y.o. male coming in with complaint of left knee and back pian. Patient states mm relaxer is helping with back and hip pain. Would like to speak about epidural injection.  Patient is wondering if he should do  this before or after the knee discussion.  Cymbalta is helping with work and the stressors occurring there. Has helped with the anxiety that some that he was having as well.  Has helped a little bit with the nerve pain he thinks in the back as well.  His arm pain has greatly improved.  Nearly completely gone  Is going to see Dr. Rhona Raider on Monday for L knee. Is wearing a knee brace with work. Having hard time walking down steps with reciprocal gait.  Patient states that overall still giving him some discomfort and sometimes instability.      Past Medical History:  Diagnosis Date  . Anemia   . Central serous retinopathy    hx/o intraocular injection therapy for 3 years; no change after 3 years of therapy, no visual c/o  . Depression   . Former smoker   . History of blood transfusion 04/17/2017   "related to anemia"  . History of kidney stones   . Hypertension   . Left varicocele   . Port-wine stain of face   . Pre-diabetes    Past Surgical History:  Procedure Laterality Date  . COLONOSCOPY     never  . EXTRACORPOREAL SHOCK WAVE LITHOTRIPSY    . PALATE / UVULA BIOPSY / EXCISION  2006   "polyp on uvula cut out"   Social History   Socioeconomic History  . Marital status: Divorced    Spouse name: Not on file  . Number of  children: Not on file  . Years of education: Not on file  . Highest education level: Not on file  Occupational History  . Not on file  Tobacco Use  . Smoking status: Former Smoker    Packs/day: 1.00    Years: 15.00    Pack years: 15.00    Types: Cigarettes    Quit date: 12/01/1997    Years since quitting: 23.1  . Smokeless tobacco: Never Used  Vaping Use  . Vaping Use: Never used  Substance and Sexual Activity  . Alcohol use: Yes    Comment: Beer.  . Drug use: No  . Sexual activity: Not Currently  Other Topics Concern  . Not on file  Social History Narrative   Divorced 04/2012, 2 children ages daughter 67yo, son 74yo; exercise - moving on the  job, walking, works as Electrical engineer   Social Determinants of Radio broadcast assistant Strain: Not on file  Food Insecurity: Not on file  Transportation Needs: Not on file  Physical Activity: Not on file  Stress: Not on file  Social Connections: Not on file   Allergies  Allergen Reactions  . Penicillins Other (See Comments)    Hands & feet peel. "childhood allergy"   Family History  Problem Relation Age of Onset  . Heart disease Mother        pacemaker  . Heart disease Father 65       died of MI, 61s  . Diabetes Father   . Stroke Father   . Migraines Sister   . Mental illness Sister   . Cancer Neg Hx   . Hyperlipidemia Neg Hx   . Hypertension Neg Hx   . Colon cancer Neg Hx      Current Outpatient Medications (Cardiovascular):  .  amLODipine (NORVASC) 5 MG tablet, Take 1 tablet (5 mg total) by mouth daily. [name]  lisinopril-hydrochlorothiazide (ZESTORETIC) 20-12.5 MG tablet, Take 1 tablet by mouth daily.     Current Outpatient Medications (Other):  [name]  DULoxetine (CYMBALTA) 20 MG capsule, Take 1 capsule (20 mg total) by mouth daily. [name]  tiZANidine (ZANAFLEX) 2 MG tablet, TAKE 1 TABLET BY MOUTH EVERYDAY AT BEDTIME   Reviewed prior external information including notes and imaging from  primary care provider As well as notes that were available from care everywhere and other healthcare systems.  Past medical history, social, surgical and family history all reviewed in electronic medical record.  No pertanent information unless stated regarding to the chief complaint.   Review of Systems:  No headache, visual changes, nausea, vomiting, diarrhea, constipation, dizziness, abdominal pain, skin rash, fevers, chills, night sweats, weight loss, swollen lymph nodes, body aches, joint swelling, chest pain, shortness of breath, mood changes. POSITIVE muscle aches  Objective  Blood pressure 122/80, pulse (!) 56, height 6\' 3"  (1.905 m), weight 187 lb (84.8 kg), SpO2 97 %.    General: No apparent distress alert and oriented x3 mood and affect normal, dressed appropriately.  HEENT: Pupils equal, extraocular movements intact  Respiratory: Patient's speak in full sentences and does not appear short of breath  Cardiovascular: No lower extremity edema, non tender, no erythema  Gait mild antalgic favoring the left knee MSK:   Left knee exam does have some mild swelling noted.  Patient does lack the last 5 degrees of extension and flexion.  Tenderness over the medial joint line and positive McMurray's.   Impression and Recommendations:     The above documentation has been reviewed and  is accurate and complete Lyndal Pulley, DO  Total time reviewing patient's MRIs, discussing patient, discussing medications and different treatment options 31 minutes

## 2021-02-05 ENCOUNTER — Other Ambulatory Visit: Payer: Self-pay

## 2021-02-05 ENCOUNTER — Encounter: Payer: Self-pay | Admitting: Family Medicine

## 2021-02-05 ENCOUNTER — Ambulatory Visit: Payer: BC Managed Care – PPO | Admitting: Family Medicine

## 2021-02-05 VITALS — BP 122/80 | HR 56 | Ht 75.0 in | Wt 187.0 lb

## 2021-02-05 DIAGNOSIS — G8929 Other chronic pain: Secondary | ICD-10-CM

## 2021-02-05 DIAGNOSIS — S3422XD Injury of nerve root of sacral spine, subsequent encounter: Secondary | ICD-10-CM | POA: Diagnosis not present

## 2021-02-05 DIAGNOSIS — K649 Unspecified hemorrhoids: Secondary | ICD-10-CM

## 2021-02-05 DIAGNOSIS — M25562 Pain in left knee: Secondary | ICD-10-CM | POA: Diagnosis not present

## 2021-02-05 DIAGNOSIS — F325 Major depressive disorder, single episode, in full remission: Secondary | ICD-10-CM

## 2021-02-05 NOTE — Assessment & Plan Note (Signed)
MRI does show the S1 nerve root impingement.  Patient would like to hold on the epidural but will consider that.  Patient will continue the Cymbalta at this point.  Follow-up with me again in 6 weeks.

## 2021-02-05 NOTE — Assessment & Plan Note (Signed)
On Cymbalta for likely some anxiety that is helping at the moment.  Patient likes the dose and no change in management

## 2021-02-05 NOTE — Patient Instructions (Addendum)
Good to see you Talk to ortho about your knee send me a message after We will hold on the back Continue the cymbalta Referral to general surgery for hemorroids can make appointment up front with PCP See me again in 5-6 weeks

## 2021-02-05 NOTE — Assessment & Plan Note (Signed)
Patient has a horizontal tear of the medial meniscus.  Continuing to have some mild internal derangement.  Patient is going to be following up with orthopedic surgery to discuss injections versus the possibility need for surgical intervention.  Patient has been hesitant to have injections previously but is looking forward to the conversation with the orthopedic surgeon.

## 2021-03-04 ENCOUNTER — Other Ambulatory Visit: Payer: Self-pay | Admitting: Family Medicine

## 2021-03-12 NOTE — Progress Notes (Signed)
[name] Phone: [phone] Subjective:   Jeffery [name], am serving as a scribe for Dr. Hulan Saas.  This visit occurred during the SARS-CoV-2 public health emergency.  Safety protocols were in place, including screening questions prior to the visit, additional usage of staff PPE, and extensive cleaning of exam room while observing appropriate contact time as indicated for disinfecting solutions.   I'm seeing this patient by the request  of:  Denita Lung, MD  CC: Multiple complaints follow-up  [id number]  02/05/2021 On Cymbalta for likely some anxiety that is helping at the moment.  Patient likes the dose and [name] change in management  MRI does show the S1 nerve root impingement.  Patient would like to hold on the epidural but will consider that.  Patient will continue the Cymbalta at this point.  Follow-up with me again in 6 weeks.  Patient has a horizontal tear of the medial meniscus.  Continuing to have some mild internal derangement.  Patient is going to be following up with orthopedic surgery to discuss injections versus the possibility need for surgical intervention.  Patient has been hesitant to have injections previously but is looking forward to the conversation with the orthopedic surgeon.  Update 03/15/2021 Jeffery [name] is a 61 y.o. male coming in with complaint of L knee and back pain. Patient states that he had surgery on June 2nd on L knee by Dr. Latanya Maudlin. Is doing PT. patient states feels somewhat weak but is looking forward to feeling better soon.  Back has not been hurting. Using muscle relaxer at night. Is out of work due to surgery.  Patient states that his hip pain is significantly better as well at this time.  Patient's MRI did show the patient did not have a central L5-S1 with protrusion of the disc causing a bilateral S1 nerve root impingement.       Past Medical History:   Diagnosis Date   Anemia    Central serous retinopathy    hx/o intraocular injection therapy for 3 years; [name] change after 3 years of therapy, [name] visual c/o   Depression    Former smoker    History of blood transfusion 04/17/2017   "related to anemia"   History of kidney stones    Hypertension    Left varicocele    Port-wine stain of face    Pre-diabetes    Past Surgical History:  Procedure Laterality Date   COLONOSCOPY     never   EXTRACORPOREAL SHOCK WAVE LITHOTRIPSY     PALATE / UVULA BIOPSY / EXCISION  2006   "polyp on uvula cut out"   Social History   Socioeconomic History   Marital status: Divorced    Spouse name: Not on file   Number of children: Not on file   Years of education: Not on file   Highest education level: Not on file  Occupational History   Not on file  Tobacco Use   Smoking status: Former    Packs/day: 1.00    Years: 15.00    Pack years: 15.00    Types: Cigarettes    Quit date: 12/01/1997    Years since quitting: 23.3   Smokeless tobacco: Never  Vaping Use   Vaping Use: Never used  Substance and Sexual Activity   Alcohol use: Yes    Comment: Beer.   Drug use: [name]   Sexual activity: Not Currently  Other Topics Concern  Not on file  Social History Narrative   Divorced 04/2012, 2 children ages daughter 85yo, son 88yo; exercise - moving on the job, walking, works as Electrical engineer   Social Determinants of Radio broadcast assistant Strain: Not on file  Food Insecurity: Not on file  Transportation Needs: Not on file  Physical Activity: Not on file  Stress: Not on file  Social Connections: Not on file   Allergies  Allergen Reactions   Penicillins Other (See Comments)    Hands & feet peel. "childhood allergy"   Family History  Problem Relation Age of Onset   Heart disease Mother        pacemaker   Heart disease Father 65       died of MI, 34s   Diabetes Father    Stroke Father    Migraines Sister    Mental illness Sister     Cancer Neg Hx    Hyperlipidemia Neg Hx    Hypertension Neg Hx    Colon cancer Neg Hx      Current Outpatient Medications (Cardiovascular):    amLODipine (NORVASC) 5 MG tablet, Take 1 tablet (5 mg total) by mouth daily.   lisinopril-hydrochlorothiazide (ZESTORETIC) 20-12.5 MG tablet, Take 1 tablet by mouth daily.     Current Outpatient Medications (Other):    DULoxetine (CYMBALTA) 20 MG capsule, Take 1 capsule (20 mg total) by mouth daily.   tiZANidine (ZANAFLEX) 2 MG tablet, TAKE 1 TABLET BY MOUTH EVERYDAY AT BEDTIME   Reviewed prior external information including notes and imaging from  primary care provider As well as notes that were available from care everywhere and other healthcare systems.  Past medical history, social, surgical and family history all reviewed in electronic medical record.  [name] pertanent information unless stated regarding to the chief complaint.   Review of Systems:  [name] headache, visual changes, nausea, vomiting, diarrhea, constipation, dizziness, abdominal pain, skin rash, fevers, chills, night sweats, weight loss, swollen lymph nodes, body aches, joint swelling, chest pain, shortness of breath, mood changes. POSITIVE muscle aches but improving potentially  Objective  Blood pressure 128/72, pulse 83, height 6\' 3"  (1.905 m), weight 183 lb (83 kg), SpO2 97 %.   General: [name] apparent distress alert and oriented x3 mood and affect normal, dressed appropriately.  HEENT: Pupils equal, extraocular movements intact  Respiratory: Patient's speak in full sentences and does not appear short of breath  Cardiovascular: [name] lower extremity edema, non tender, [name] erythema  Gait antalgic still favoring the left knee.  Left knee does have trace effusion noted. Back exam today very unremarkable.  Remaining mild loss of lordosis and loss of 5 degrees of extension.   Impression and Recommendations:     The above documentation has been reviewed and is accurate and complete  Lyndal Pulley, DO

## 2021-03-15 ENCOUNTER — Other Ambulatory Visit: Payer: Self-pay

## 2021-03-15 ENCOUNTER — Ambulatory Visit: Payer: BC Managed Care – PPO | Admitting: Family Medicine

## 2021-03-15 ENCOUNTER — Encounter: Payer: Self-pay | Admitting: Family Medicine

## 2021-03-15 DIAGNOSIS — S3422XD Injury of nerve root of sacral spine, subsequent encounter: Secondary | ICD-10-CM | POA: Diagnosis not present

## 2021-03-15 DIAGNOSIS — M25562 Pain in left knee: Secondary | ICD-10-CM | POA: Diagnosis not present

## 2021-03-15 DIAGNOSIS — G8929 Other chronic pain: Secondary | ICD-10-CM | POA: Diagnosis not present

## 2021-03-15 NOTE — Assessment & Plan Note (Signed)
Patient is doing well with the Cymbalta.  Would state that neck step would be considering the possibility for nerve root or epidural injection.  Patient wants to continue with conservative therapy.  Patient will be returning to work likely July 11 and we will see how patient responds.  Continue exacerbation of the patient encouraged the possibility of the injection.  Patient will continue to Cymbalta as well as the temazepam.  Total time with patient today greater than 31 minutes

## 2021-03-15 NOTE — Patient Instructions (Signed)
Glad we are making progress Continue medications See me in 7-8 weeks see how you are doing with return to work

## 2021-03-15 NOTE — Assessment & Plan Note (Signed)
I had not put out any rehab patient had surgery and is doing very well with a meniscal injury.  Patient is making progress.  Continue to monitor closely.  Patient will follow up with the orthopedic surgeon.  We will see me again in 7 to 8 weeks to see how patient is responding with everything.

## 2021-04-02 ENCOUNTER — Other Ambulatory Visit: Payer: Self-pay | Admitting: Family Medicine

## 2021-04-27 ENCOUNTER — Other Ambulatory Visit: Payer: Self-pay | Admitting: Family Medicine

## 2021-04-28 ENCOUNTER — Other Ambulatory Visit: Payer: Self-pay | Admitting: Family Medicine

## 2021-04-29 ENCOUNTER — Other Ambulatory Visit: Payer: Self-pay | Admitting: Family Medicine

## 2021-04-29 DIAGNOSIS — I10 Essential (primary) hypertension: Secondary | ICD-10-CM

## 2021-05-07 ENCOUNTER — Ambulatory Visit (INDEPENDENT_AMBULATORY_CARE_PROVIDER_SITE_OTHER): Payer: BC Managed Care – PPO | Admitting: Family Medicine

## 2021-05-07 ENCOUNTER — Encounter: Payer: Self-pay | Admitting: Family Medicine

## 2021-05-07 ENCOUNTER — Other Ambulatory Visit: Payer: Self-pay

## 2021-05-07 VITALS — BP 120/88 | HR 78 | Ht 75.0 in | Wt 185.0 lb

## 2021-05-07 DIAGNOSIS — M5416 Radiculopathy, lumbar region: Secondary | ICD-10-CM | POA: Diagnosis not present

## 2021-05-07 DIAGNOSIS — S3422XD Injury of nerve root of sacral spine, subsequent encounter: Secondary | ICD-10-CM | POA: Diagnosis not present

## 2021-05-07 NOTE — Assessment & Plan Note (Signed)
Patient was making some improvement with the Cymbalta but now seems to have plateaued.  Started working again after his knee surgery and is having more difficulty in the back.  Does have a positive straight leg test noted today.  Patient has had difficulty trying to get different opportunities at work that may be less painful for him.  We discussed with him about different options at this time.  Encourage patient to consider the possibility of an ergonomic evaluation with a report and we cannot write a letter based on the recommendations of what drop functions he can do.  In the meantime patient wants to continue with homework but I do feel that he needs 15-minute breaks every 2 hours.  If worsening pain we may need to take him out of work.  Patient would of course like to avoid any type of surgical intervention.  We will try epidural to see how patient responds.  Continuing to have some radicular symptoms.  Follow-up with me again 6 months otherwise.  Total time discussing with patient as well as reviewing patient's MRI today and talking about different medications greater than 45 minutes.

## 2021-05-07 NOTE — Progress Notes (Signed)
[place] Phone: [phone] Subjective:   [name], am serving as a scribe for Dr. Hulan Saas.  This visit occurred during the SARS-CoV-2 public health emergency.  Safety protocols were in place, including screening questions prior to the visit, additional usage of staff PPE, and extensive cleaning of exam room while observing appropriate contact time as indicated for disinfecting solutions.    I'm seeing this patient by the request  of:  Denita Lung, MD  CC: Left knee pain follow-up, back pain follow-up  [id number]  03/15/2021 Patient is doing well with the Cymbalta.  Would state that neck step would be considering the possibility for nerve root or epidural injection.  Patient wants to continue with conservative therapy.  Patient will be returning to work likely July 11 and we will see how patient responds.  Continue exacerbation of the patient encouraged the possibility of the injection.  Patient will continue to Cymbalta as well as the temazepam.  Total time with patient today greater than 31 minutes   I had not put out any rehab patient had surgery and is doing very well with a meniscal injury.  Patient is making progress.  Continue to monitor closely.  Patient will follow up with the orthopedic surgeon.  We will see me again in 7 to 8 weeks to see how patient is responding with everything.  Update 05/07/2021 MEZIAH LIEBER is a 60 y.o. male coming in with complaint of L knee and LBP. Patient states that he did some PT for L knee. Was out of work for additional 2 weeks.   Lower back pain persists. Has returned to work 2 weeks ago with a progressive build up each week. Should return to full duty next week.  Patient states that unfortunately continues to have discomfort and pain.  Patient wants to be able to work but finds that it is difficult.  He feels like he is put on the hardest rotations at  work all the time and he is not rotating off of them.  Patient has talked to his human resources about this.  Patient feels like it is causing more and more of musculoskeletal problems.  Now his back is worsening again with radicular symptoms.  Patient notes some R knee pain over anterior aspect.  Not terrible just would like it to be reviewed.      Past Medical History:  Diagnosis Date   Anemia    Central serous retinopathy    hx/o intraocular injection therapy for 3 years; no change after 3 years of therapy, no visual c/o   Depression    Former smoker    History of blood transfusion 04/17/2017   "related to anemia"   History of kidney stones    Hypertension    Left varicocele    Port-wine stain of face    Pre-diabetes    Past Surgical History:  Procedure Laterality Date   COLONOSCOPY     never   EXTRACORPOREAL SHOCK WAVE LITHOTRIPSY     PALATE / UVULA BIOPSY / EXCISION  2006   "polyp on uvula cut out"   Social History   Socioeconomic History   Marital status: Divorced    Spouse name: Not on file   Number of children: Not on file   Years of education: Not on file   Highest education level: Not on file  Occupational History   Not on file  Tobacco Use  Smoking status: Former    Packs/day: 1.00    Years: 15.00    Pack years: 15.00    Types: Cigarettes    Quit date: 12/01/1997    Years since quitting: 23.4   Smokeless tobacco: Never  Vaping Use   Vaping Use: Never used  Substance and Sexual Activity   Alcohol use: Yes    Comment: Beer.   Drug use: No   Sexual activity: Not Currently  Other Topics Concern   Not on file  Social History Narrative   Divorced 04/2012, 2 children ages daughter 61yo, son 73yo; exercise - moving on the job, walking, works as Electrical engineer   Social Determinants of Radio broadcast assistant Strain: Not on file  Food Insecurity: Not on file  Transportation Needs: Not on file  Physical Activity: Not on file  Stress: Not on file   Social Connections: Not on file   Allergies  Allergen Reactions   Penicillins Other (See Comments)    Hands & feet peel. "childhood allergy"   Family History  Problem Relation Age of Onset   Heart disease Mother        pacemaker   Heart disease Father 63       died of MI, 45s   Diabetes Father    Stroke Father    Migraines Sister    Mental illness Sister    Cancer Neg Hx    Hyperlipidemia Neg Hx    Hypertension Neg Hx    Colon cancer Neg Hx      Current Outpatient Medications (Cardiovascular):    amLODipine (NORVASC) 5 MG tablet, TAKE 1 TABLET BY MOUTH EVERY DAY   lisinopril-hydrochlorothiazide (ZESTORETIC) 20-12.5 MG tablet, TAKE 1 TABLET BY MOUTH EVERY DAY     Current Outpatient Medications (Other):    DULoxetine (CYMBALTA) 20 MG capsule, TAKE 1 CAPSULE BY MOUTH EVERY DAY   tiZANidine (ZANAFLEX) 2 MG tablet, TAKE 1 TABLET BY MOUTH EVERYDAY AT BEDTIME   Reviewed prior external information including notes and imaging from  primary care provider As well as notes that were available from care everywhere and other healthcare systems.  Past medical history, social, surgical and family history all reviewed in electronic medical record.  No pertanent information unless stated regarding to the chief complaint.   Review of Systems:  No headache, visual changes, nausea, vomiting, diarrhea, constipation, dizziness, abdominal pain, skin rash, fevers, chills, night sweats, weight loss, swollen lymph nodes, body aches, joint swelling, chest pain, shortness of breath, mood changes. POSITIVE muscle aches  Objective  Blood pressure 120/88, pulse 78, height '6\' 3"'$  (1.905 m), weight 185 lb (83.9 kg), SpO2 98 %.   General: No apparent distress alert and oriented x3 mood and affect normal, dressed appropriately.  HEENT: Pupils equal, extraocular movements intact  Respiratory: Patient's speak in full sentences and does not appear short of breath  Cardiovascular: No lower extremity  edema, non tender, no erythema  Gait antalgic MSK: Left knee does not have any significant swelling and right knee does not have any significant swelling.  Full range of motion of both at this time.  Negative Thessaly's.  Patient has very minimal discomfort over the right patella tendon. Back exam does have some loss of lordosis.  Positive straight leg test.  Patient does not have true weakness though noted.  Patient does have tightness of the hamstring as well.    Impression and Recommendations:    The above documentation has been reviewed and is accurate and complete  Lyndal Pulley, DO

## 2021-05-07 NOTE — Patient Instructions (Signed)
Epidural L4/L5 [id number] Ergonomic evaluation and report letter Letter: Secondary to LBP needs 15 min seated break every 2 hours See me gain in 6 weeks

## 2021-05-14 ENCOUNTER — Telehealth: Payer: Self-pay | Admitting: Family Medicine

## 2021-05-14 NOTE — Telephone Encounter (Signed)
Jeffery [name] called from pt employer. She is a Community education officer and works in Teacher, adult education". Josph Macho gave her the note we wrote 8/5 about having an ergonomic test and she would like some clarity on what Dr. Tamala Julian is looking for so that she can make a plan.  Jeffery cell [phone]

## 2021-05-18 NOTE — Telephone Encounter (Signed)
Spoke with PT who is going to discuss moving forward with evaluation for patient based on machines he uses most often and that would be of most benefit to help decrease musculoskeletal pain.

## 2021-05-20 ENCOUNTER — Ambulatory Visit
Admission: RE | Admit: 2021-05-20 | Discharge: 2021-05-20 | Disposition: A | Discharge status: Home / Self Care | Payer: BC Managed Care – PPO | Source: Ambulatory Visit | Attending: Family Medicine | Admitting: Family Medicine

## 2021-05-20 ENCOUNTER — Other Ambulatory Visit: Payer: BC Managed Care – PPO

## 2021-05-20 ENCOUNTER — Other Ambulatory Visit: Payer: Self-pay

## 2021-05-20 DIAGNOSIS — M5416 Radiculopathy, lumbar region: Secondary | ICD-10-CM

## 2021-05-20 IMAGING — XA Imaging study
2 series · 2 of 2 positions shown · non-contrast
Comparison: none

CLINICAL DATA: Low back pain. Bilateral hip and ischial region
pain. Lumbar radiculopathy.

[Series 1: ortho adipose · 1 of 1 slices shown (1 of 2)]
[im 1/1]
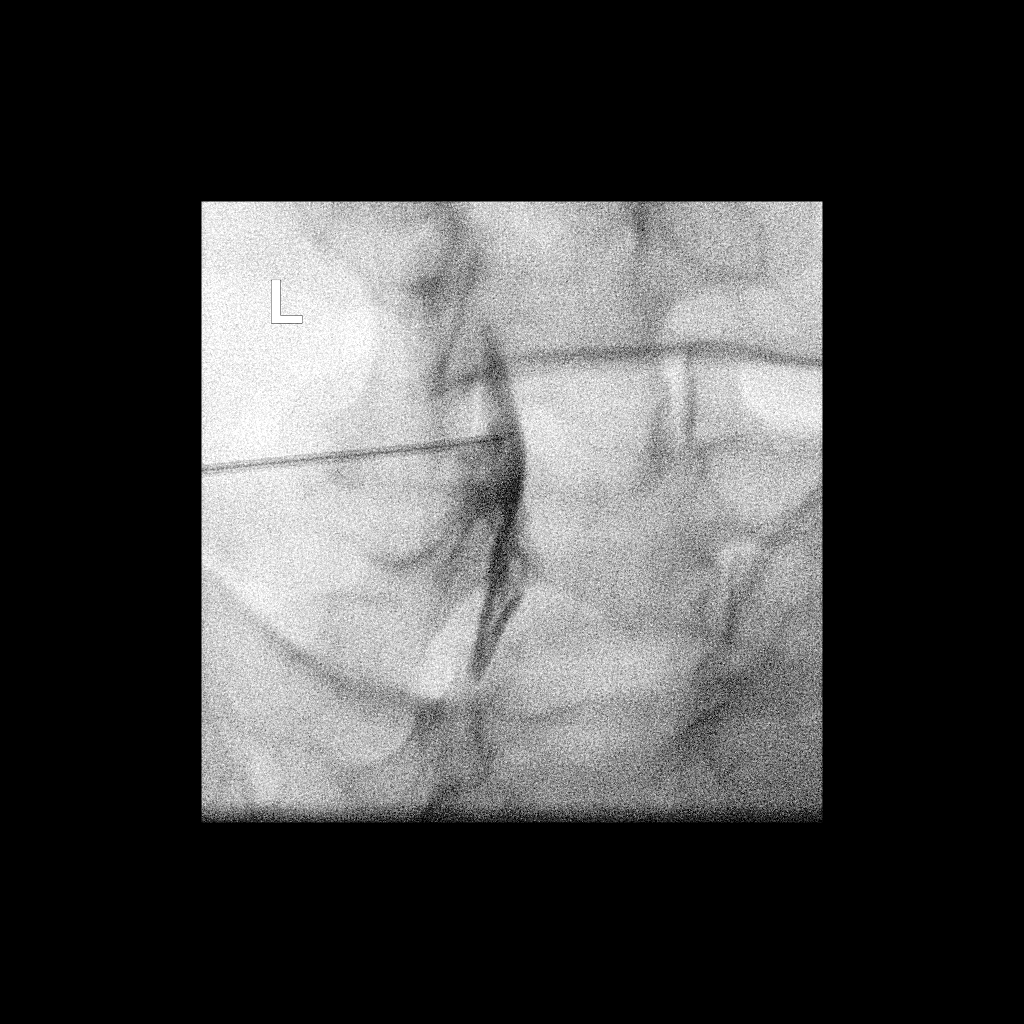

[Series 2: ortho adipose · 1 of 1 slices shown (2 of 2)]
[im 1/1]
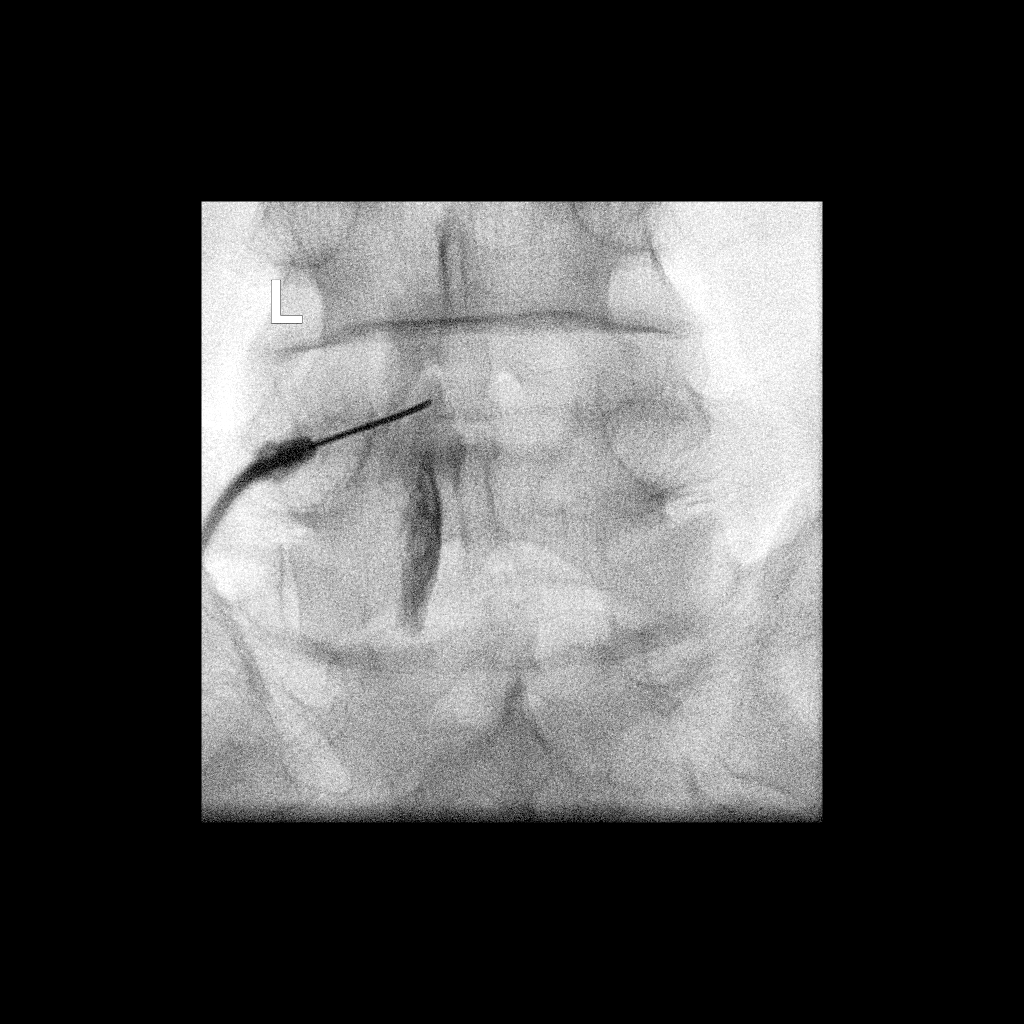

[2 of 2 positions shown; findings below may reference images not displayed]

FLUOROSCOPY TIME:  0 minutes 16 seconds. 12.40 micro gray meter
squared

PROCEDURE:
The procedure, risks, benefits, and alternatives were explained to
the patient. Questions regarding the procedure were encouraged and
answered. The patient understands and consents to the procedure.

LUMBAR EPIDURAL INJECTION:

An interlaminar approach was performed on the left at L4-5. The
overlying skin was cleansed and anesthetized. A 20 gauge epidural
needle was advanced using loss-of-resistance technique.

DIAGNOSTIC EPIDURAL INJECTION:

Injection of Isovue-M 200 shows a good epidural pattern with spread
above and below the level of needle placement, primarily on the
left, but to both sides. No vascular opacification is seen.

THERAPEUTIC EPIDURAL INJECTION:

Eighty mg of Depo-Medrol mixed with 2.5 cc 1% lidocaine were
instilled. The procedure was well-tolerated, and the patient was
discharged thirty minutes following the injection in good condition.

COMPLICATIONS:
None
IMPRESSION: Technically successful epidural injection on the left at L4-5 # 1.

## 2021-05-20 MED ORDER — IOPAMIDOL (ISOVUE-M 200) INJECTION 41%
1.0000 mL | Freq: Once | INTRAMUSCULAR | Status: AC
  Administered 2021-05-20: 1 mL via EPIDURAL

## 2021-05-20 MED ORDER — METHYLPREDNISOLONE ACETATE 40 MG/ML INJ SUSP (RADIOLOG
80.0000 mg | Freq: Once | INTRAMUSCULAR | Status: AC
  Administered 2021-05-20: 80 mg via EPIDURAL

## 2021-05-20 NOTE — Discharge Instructions (Signed)

## 2021-05-21 ENCOUNTER — Other Ambulatory Visit: Payer: Self-pay | Admitting: Family Medicine

## 2021-06-17 NOTE — Progress Notes (Signed)
    Virtual Visit via Video Note  I connected with Jeffery [name] on 06/22/21 at 11:45 AM EDT by a video enabled telemedicine application and verified that I am speaking with the correct person using two identifiers. Patient is alone on the phone call.  Difficulty with the virtual platform Location: Patient: In home setting Provider: In office setting   I discussed the limitations of evaluation and management by telemedicine and the availability of in person appointments. The patient expressed understanding and agreed to proceed.  History of Present Illness: Previous office note  Patient was making some improvement with the Cymbalta but now seems to have plateaued.  Started working again after his knee surgery and is having more difficulty in the back.  Does have a positive straight leg test noted today.  Patient has had difficulty trying to get different opportunities at work that may be less painful for him.  We discussed with him about different options at this time.  Encourage patient to consider the possibility of an ergonomic evaluation with a report and we cannot write a letter based on the recommendations of what drop functions he can do.  In the meantime patient wants to continue with homework but I do feel that he needs 15-minute breaks every 2 hours.  If worsening pain we may need to take him out of work.  Patient would of course like to avoid any type of surgical intervention.  We will try epidural to see how patient responds.  Continuing to have some radicular symptoms.  Follow-up with me again 6 months otherwise.  Total time discussing with patient as well as reviewing patient's MRI today and talking about different medications greater than 45 minutes.  Epidural given 8/18  Patient states that the back pain is approximately 60% better.  Feels like the injection was very helpful and is slowly may be worsening again.  Does feel like this has made a difference though.    Observations/Objective: Alert and oriented x3   Assessment and Plan: Patient does have a nerve root impingement noted.  Had responded well to the injection and do feel another epidural would be beneficial.  Patient still with her his left knee and right knee should still be on some job restrictions with no breaks.  Patient states that we should have some FMLA paperwork which we do not but will continue to look for it.  Follow-up with me again 6 weeks   Follow Up Instructions: Follow-up in 6 weeks.    I discussed the assessment and treatment plan with the patient. The patient was provided an opportunity to ask questions and all were answered. The patient agreed with the plan and demonstrated an understanding of the instructions.   The patient was advised to call back or seek an in-person evaluation if the symptoms worsen or if the condition fails to improve as anticipated.  I provided 22 minutes of non-face-to-face time during this encounter.   Lyndal Pulley, DO

## 2021-06-18 ENCOUNTER — Encounter: Payer: BC Managed Care – PPO | Admitting: Family Medicine

## 2021-06-21 ENCOUNTER — Other Ambulatory Visit: Payer: Self-pay | Admitting: Family Medicine

## 2021-06-22 ENCOUNTER — Ambulatory Visit (INDEPENDENT_AMBULATORY_CARE_PROVIDER_SITE_OTHER): Payer: BC Managed Care – PPO | Admitting: Family Medicine

## 2021-06-22 ENCOUNTER — Encounter: Payer: Self-pay | Admitting: Family Medicine

## 2021-06-22 ENCOUNTER — Other Ambulatory Visit: Payer: Self-pay

## 2021-06-22 DIAGNOSIS — S3422XD Injury of nerve root of sacral spine, subsequent encounter: Secondary | ICD-10-CM

## 2021-06-22 DIAGNOSIS — M5416 Radiculopathy, lumbar region: Secondary | ICD-10-CM

## 2021-06-22 NOTE — Assessment & Plan Note (Signed)
Do feel another injection could be beneficial.  Patient will be ordered again.  He did have approximately of a 60% improvement with the first 1 and hopefully will make him progress.  Discussed that follow-up in 5 to 6 weeks.

## 2021-06-24 ENCOUNTER — Other Ambulatory Visit: Payer: BC Managed Care – PPO

## 2021-07-06 ENCOUNTER — Other Ambulatory Visit: Payer: BC Managed Care – PPO

## 2021-07-07 ENCOUNTER — Ambulatory Visit
Admission: RE | Admit: 2021-07-07 | Discharge: 2021-07-07 | Disposition: A | Discharge status: Home / Self Care | Payer: BC Managed Care – PPO | Source: Ambulatory Visit | Attending: Family Medicine | Admitting: Family Medicine

## 2021-07-07 ENCOUNTER — Other Ambulatory Visit: Payer: Self-pay

## 2021-07-07 DIAGNOSIS — M5416 Radiculopathy, lumbar region: Secondary | ICD-10-CM

## 2021-07-07 IMAGING — XA Imaging study
2 series · 2 of 2 positions shown · non-contrast
Comparison: none

CLINICAL DATA: Lumbosacral spondylosis without myelopathy. Low back
and left buttock pain. 60% relief after L4-L5 interlaminar epidural
injection in [REDACTED].

[Series 1: ortho adipose · 1 of 1 slices shown (1 of 2)]
[im 1/1]
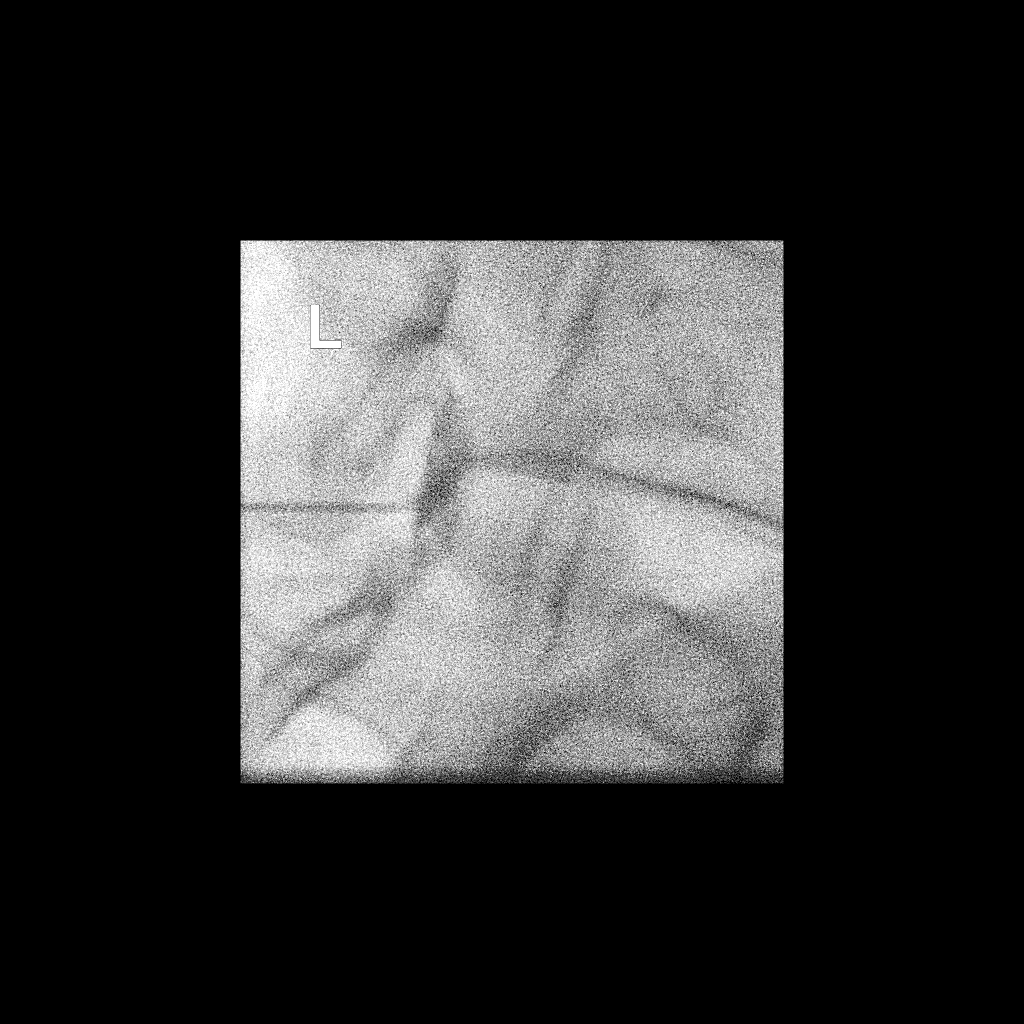

[Series 2: ortho adipose · 1 of 1 slices shown (2 of 2)]
[im 1/1]
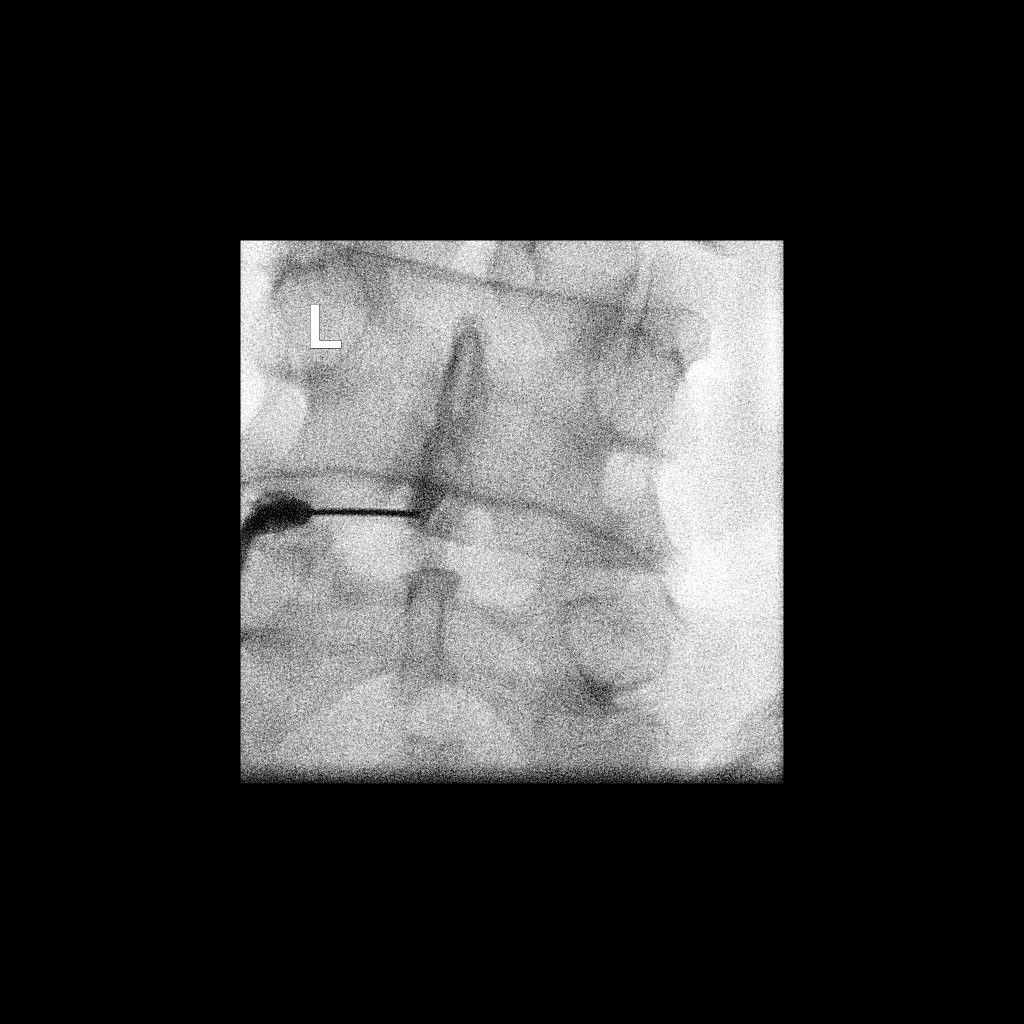

[2 of 2 positions shown; findings below may reference images not displayed]

FLUOROSCOPY TIME:  Radiation Exposure Index (as provided by the
fluoroscopic device): 2.4 mGy

Fluoroscopy Time:  22 seconds

Number of Acquired Images:  0

PROCEDURE:
The procedure, risks, benefits, and alternatives were explained to
the patient. Questions regarding the procedure were encouraged and
answered. The patient understands and consents to the procedure.

LUMBAR EPIDURAL INJECTION:

An interlaminar approach was performed on the left at L4-L5. The
overlying skin was cleansed and anesthetized. A 3.5 inch 20 gauge
epidural needle was advanced using loss-of-resistance technique.

DIAGNOSTIC EPIDURAL INJECTION:

Injection of Isovue-M 200 shows a good epidural pattern with spread
above and below the level of needle placement, primarily in the
midline. No vascular opacification is seen.

THERAPEUTIC EPIDURAL INJECTION:

80 mg of Depo-Medrol mixed with 3 mL of 1% lidocaine were instilled.
The procedure was well-tolerated, and the patient was discharged
thirty minutes following the injection in good condition.

COMPLICATIONS:
None immediate.
IMPRESSION: Technically successful interlaminar epidural injection on the left
at L4-L5.

## 2021-07-07 MED ORDER — METHYLPREDNISOLONE ACETATE 40 MG/ML INJ SUSP (RADIOLOG
80.0000 mg | Freq: Once | INTRAMUSCULAR | Status: AC
  Administered 2021-07-07: 80 mg via EPIDURAL

## 2021-07-07 MED ORDER — IOPAMIDOL (ISOVUE-M 200) INJECTION 41%
1.0000 mL | Freq: Once | INTRAMUSCULAR | Status: AC
  Administered 2021-07-07: 1 mL via EPIDURAL

## 2021-07-07 NOTE — Discharge Instructions (Signed)

## 2021-07-15 ENCOUNTER — Other Ambulatory Visit: Payer: Self-pay

## 2021-07-15 ENCOUNTER — Ambulatory Visit: Payer: BC Managed Care – PPO | Admitting: Family Medicine

## 2021-07-15 ENCOUNTER — Encounter: Payer: Self-pay | Admitting: Family Medicine

## 2021-07-15 VITALS — BP 124/70 | HR 84 | Temp 98.5°F | Ht 73.25 in | Wt 184.2 lb

## 2021-07-15 DIAGNOSIS — I10 Essential (primary) hypertension: Secondary | ICD-10-CM

## 2021-07-15 DIAGNOSIS — Z9889 Other specified postprocedural states: Secondary | ICD-10-CM | POA: Diagnosis not present

## 2021-07-15 DIAGNOSIS — S3422XD Injury of nerve root of sacral spine, subsequent encounter: Secondary | ICD-10-CM

## 2021-07-15 DIAGNOSIS — Z0000 Encounter for general adult medical examination without abnormal findings: Secondary | ICD-10-CM | POA: Diagnosis not present

## 2021-07-15 DIAGNOSIS — I491 Atrial premature depolarization: Secondary | ICD-10-CM

## 2021-07-15 DIAGNOSIS — F325 Major depressive disorder, single episode, in full remission: Secondary | ICD-10-CM | POA: Diagnosis not present

## 2021-07-15 DIAGNOSIS — E7439 Other disorders of intestinal carbohydrate absorption: Secondary | ICD-10-CM

## 2021-07-15 DIAGNOSIS — Z2821 Immunization not carried out because of patient refusal: Secondary | ICD-10-CM | POA: Diagnosis not present

## 2021-07-15 DIAGNOSIS — H35719 Central serous chorioretinopathy, unspecified eye: Secondary | ICD-10-CM

## 2021-07-15 DIAGNOSIS — Q825 Congenital non-neoplastic nevus: Secondary | ICD-10-CM

## 2021-07-15 MED ORDER — DULOXETINE HCL 20 MG PO CPEP: ORAL_CAPSULE | ORAL | 3 refills | Status: DC

## 2021-07-15 MED ORDER — AMLODIPINE BESYLATE 5 MG PO TABS: 5.0000 mg | ORAL_TABLET | Freq: Every day | ORAL | 3 refills | Status: DC

## 2021-07-15 MED ORDER — LISINOPRIL-HYDROCHLOROTHIAZIDE 20-12.5 MG PO TABS: 1.0000 | ORAL_TABLET | Freq: Every day | ORAL | 3 refills | Status: DC

## 2021-07-15 NOTE — Progress Notes (Signed)
   Subjective:    Patient ID: Jeffery [name], male    DOB: 09-Mar-1961, 60 y.o.   MRN: [id number]  HPI He is here for complete examination.  He has had knee surgery and is doing well with that.  He did have a lot of meniscal damage.  He also has been having difficulty with low back pain and has had 2 epidural injections.  They both have helped.  He also has a history of central serous retinopathy.  He is apparently tried various modalities for Ziac without much success.  He does follow-up with ophthalmology concerning that.  He also states that he has a new job which has helped with his back pain.  He continues on his blood pressure medications and is having no difficulty with that.  He does have a history of glucose intolerance.  He would like to continue on Cymbalta as he still does feel somewhat stressed especially with his new job.  He is not married and not dating anyone.  Otherwise his family and social history as well as health maintenance and immunizations was reviewed   Review of Systems  All other systems reviewed and are negative.     Objective:   Physical Exam Alert and in no distress. Tympanic membranes and canals are normal. Pharyngeal area is normal. Neck is supple without adenopathy or thyromegaly.  Port wine stain noted on the right lateral face and ear and neck cardiac exam shows an irregular sinus rhythm without murmurs or gallops. Lungs are clear to auscultation. EKG read by me does show a pulse rate of 71 with other parameters being normal.  PAC is noted.  There is a compensatory pause but the QRS appears normal.       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  We will also do a hemoglobin A1c.

## 2021-07-16 LAB — COMPREHENSIVE METABOLIC PANEL
ALT: 17 IU/L (ref 0–44)
AST: 16 IU/L (ref 0–40)
Albumin/Globulin Ratio: 2.7 — ABNORMAL HIGH (ref 1.2–2.2)
Albumin: 5.1 g/dL — ABNORMAL HIGH (ref 3.8–4.9)
Alkaline Phosphatase: 113 IU/L (ref 44–121)
BUN/Creatinine Ratio: 16 (ref 9–20)
BUN: 17 mg/dL (ref 6–24)
Bilirubin Total: 0.3 mg/dL (ref 0.0–1.2)
CO2: 23 mmol/L (ref 20–29)
Calcium: 10.4 mg/dL — ABNORMAL HIGH (ref 8.7–10.2)
Chloride: 99 mmol/L (ref 96–106)
Creatinine, Ser: 1.04 mg/dL (ref 0.76–1.27)
Globulin, Total: 1.9 g/dL (ref 1.5–4.5)
Glucose: 102 mg/dL — ABNORMAL HIGH (ref 70–99)
Potassium: 4.4 mmol/L (ref 3.5–5.2)
Sodium: 138 mmol/L (ref 134–144)
Total Protein: 7 g/dL (ref 6.0–8.5)
eGFR: 83 mL/min/{1.73_m2} (ref >59)

## 2021-07-16 LAB — CBC WITH DIFFERENTIAL/PLATELET
Basophils Absolute: 0 10*3/uL (ref 0.0–0.2)
Basos: 1 %
EOS (ABSOLUTE): 0.2 10*3/uL (ref 0.0–0.4)
Eos: 2 %
Hematocrit: 41.8 % (ref 37.5–51.0)
Hemoglobin: 14.4 g/dL (ref 13.0–17.7)
Immature Grans (Abs): 0 10*3/uL (ref 0.0–0.1)
Immature Granulocytes: 0 %
Lymphocytes Absolute: 1.4 10*3/uL (ref 0.7–3.1)
Lymphs: 20 %
MCH: 31.9 pg (ref 26.6–33.0)
MCHC: 34.4 g/dL (ref 31.5–35.7)
MCV: 93 fL (ref 79–97)
Monocytes Absolute: 0.8 10*3/uL (ref 0.1–0.9)
Monocytes: 11 %
Neutrophils Absolute: 4.7 10*3/uL (ref 1.4–7.0)
Neutrophils: 66 %
Platelets: 270 10*3/uL (ref 150–450)
RBC: 4.52 x10E6/uL (ref 4.14–5.80)
RDW: 12.1 % (ref 11.6–15.4)
WBC: 7.1 10*3/uL (ref 3.4–10.8)

## 2021-07-16 LAB — LIPID PANEL
Chol/HDL Ratio: 2.7 ratio (ref 0.0–5.0)
Cholesterol, Total: 191 mg/dL (ref 100–199)
HDL: 70 mg/dL (ref >39)
LDL Chol Calc (NIH): 112 mg/dL — ABNORMAL HIGH (ref 0–99)
Triglycerides: 49 mg/dL (ref 0–149)
VLDL Cholesterol Cal: 9 mg/dL (ref 5–40)

## 2021-07-19 LAB — HGB A1C W/O EAG: Hgb A1c MFr Bld: 6 % — ABNORMAL HIGH (ref 4.8–5.6)

## 2021-07-19 LAB — SPECIMEN STATUS REPORT

## 2021-07-21 ENCOUNTER — Other Ambulatory Visit: Payer: Self-pay | Admitting: Family Medicine

## 2021-07-23 ENCOUNTER — Telehealth: Payer: Self-pay | Admitting: Family Medicine

## 2021-07-23 NOTE — Telephone Encounter (Signed)
Patient called in regards to the ongoing issues with his FMLA. He said that this has been discussed with Almyra Free and Mateo Flow Almyra Free is off today so he was filling me in). Patient states that he was out a couple days and called [employer] to report it. He was approved for the time off. He then received an epidural injection and was written out of work for 24 hours and went back to work that Friday.  Since then, his time off has been denied and more FMLA forms were needed. We have completed these forms and sent them to [facility]. He said that at this time, they are needing more information.  They have requested the duration of treatment, estimated time off and a statement of why he is not able to perform his job duties.  (This should be on the form that was recently filled out).  Can you help with this?

## 2021-07-23 NOTE — Telephone Encounter (Signed)
Can we confirm the most recent paperwork has all information on it? He said that this should have covered those days off?

## 2021-07-23 NOTE — Telephone Encounter (Signed)
Left message for patient that he will need to ask providers that wrote him out to fill out FMLA paperwork as Dr. Tamala Julian did not write him out.

## 2021-07-23 NOTE — Telephone Encounter (Signed)
error 

## 2021-07-26 NOTE — Telephone Encounter (Signed)
Form faxed

## 2021-07-26 NOTE — Telephone Encounter (Signed)
Patient notified

## 2021-07-26 NOTE — Telephone Encounter (Signed)
Spoke with patient. He said they need additional information on form to speak to as to why he was off for a few days.

## 2021-08-05 NOTE — Progress Notes (Signed)
Jeffery [name] [address] [place] Phone: [phone] Subjective:   Jeffery [name], am serving as a scribe for Dr. Hulan Saas. This visit occurred during the SARS-CoV-2 public health emergency.  Safety protocols were in place, including screening questions prior to the visit, additional usage of staff PPE, and extensive cleaning of exam room while observing appropriate contact time as indicated for disinfecting solutions.   I'm seeing this patient by the request  of:  Denita Lung, MD  CC: back pain follow up   [id number]  06/22/2021 Do feel another injection could be beneficial.  Patient will be ordered again.  He did have approximately of a 60% improvement with the first 1 and hopefully will make him progress.  Discussed that follow-up in 5 to 6 weeks.  Updated 08/09/2021 Jeffery [name] is a 60 y.o. male coming in with complaint of back pain. Epi on 07/07/2021. Progressively better since epidural. Some days are better than others. Is getting pains in other areas of back, but subsiding quickly. Left knee has been swelling, believes its from long hours on feet.      Past Medical History:  Diagnosis Date   Anemia    Central serous retinopathy    hx/o intraocular injection therapy for 3 years; no change after 3 years of therapy, no visual c/o   Depression    Former smoker    History of blood transfusion 04/17/2017   "related to anemia"   History of kidney stones    Hypertension    Left varicocele    Port-wine stain of face    Pre-diabetes    Past Surgical History:  Procedure Laterality Date   COLONOSCOPY     never   EXTRACORPOREAL SHOCK WAVE LITHOTRIPSY     PALATE / UVULA BIOPSY / EXCISION  2006   "polyp on uvula cut out"   Social History   Socioeconomic History   Marital status: Divorced    Spouse name: Not on file   Number of children: Not on file   Years of education: Not on file   Highest education level:  Not on file  Occupational History   Not on file  Tobacco Use   Smoking status: Former    Packs/day: 1.00    Years: 15.00    Pack years: 15.00    Types: Cigarettes    Quit date: 12/01/1997    Years since quitting: 23.7   Smokeless tobacco: Never  Vaping Use   Vaping Use: Never used  Substance and Sexual Activity   Alcohol use: Yes    Comment: Beer.   Drug use: No   Sexual activity: Not Currently  Other Topics Concern   Not on file  Social History Narrative   Divorced 04/2012, 2 children ages daughter 75yo, son 61yo; exercise - moving on the job, walking, works as Electrical engineer   Social Determinants of Radio broadcast assistant Strain: Not on file  Food Insecurity: Not on file  Transportation Needs: Not on file  Physical Activity: Not on file  Stress: Not on file  Social Connections: Not on file   Allergies  Allergen Reactions   Penicillins Other (See Comments)    Hands & feet peel. "childhood allergy"   Family History  Problem Relation Age of Onset   Heart disease Mother        pacemaker   Heart disease Father 64       died of MI, 48s  Diabetes Father    Stroke Father    Migraines Sister    Mental illness Sister    Cancer Neg Hx    Hyperlipidemia Neg Hx    Hypertension Neg Hx    Colon cancer Neg Hx      Current Outpatient Medications (Cardiovascular):    amLODipine (NORVASC) 5 MG tablet, Take 1 tablet (5 mg total) by mouth daily.   lisinopril-hydrochlorothiazide (ZESTORETIC) 20-12.5 MG tablet, Take 1 tablet by mouth daily.     Current Outpatient Medications (Other):    DULoxetine (CYMBALTA) 20 MG capsule, TAKE 1 CAPSULE BY MOUTH EVERY DAY   tiZANidine (ZANAFLEX) 2 MG tablet, TAKE 1 TABLET BY MOUTH EVERYDAY AT BEDTIME   Reviewed prior external information including notes and imaging from  primary care provider As well as notes that were available from care everywhere and other healthcare systems.  Past medical history, social, surgical and  family history all reviewed in electronic medical record.  No pertanent information unless stated regarding to the chief complaint.   Review of Systems:  No headache, visual changes, nausea, vomiting, diarrhea, constipation, dizziness, abdominal pain, skin rash, fevers, chills, night sweats, weight loss, swollen lymph nodes, body aches, joint swelling, chest pain, shortness of breath, mood changes. POSITIVE muscle aches  Objective  Blood pressure 122/76, pulse 78, height 6\' 1"  (1.854 m), weight 189 lb (85.7 kg), SpO2 98 %.   General: No apparent distress alert and oriented x3 mood and affect normal, dressed appropriately.  HEENT: Pupils equal, extraocular movements intact  Respiratory: Patient's speak in full sentences and does not appear short of breath  Cardiovascular: No lower extremity edema, non tender, no erythema  Gait normal with good balance and coordination.  MSK: Low back exam does have some mild loss of lordosis.  Mild tender on exam paraspinal musculature.  Sitting fairly comfortably patient does have mild tightness with FABER test.  Left knee exam still shows some mild atrophy of the VMO but very minimal tenderness on exam.  No significant instability.    Impression and Recommendations:    The above documentation has been reviewed and is accurate and complete Lyndal Pulley, DO

## 2021-08-09 ENCOUNTER — Ambulatory Visit (INDEPENDENT_AMBULATORY_CARE_PROVIDER_SITE_OTHER): Payer: BC Managed Care – PPO | Admitting: Family Medicine

## 2021-08-09 ENCOUNTER — Encounter: Payer: Self-pay | Admitting: Family Medicine

## 2021-08-09 ENCOUNTER — Other Ambulatory Visit: Payer: Self-pay

## 2021-08-09 DIAGNOSIS — Z9889 Other specified postprocedural states: Secondary | ICD-10-CM

## 2021-08-09 DIAGNOSIS — S3422XD Injury of nerve root of sacral spine, subsequent encounter: Secondary | ICD-10-CM | POA: Diagnosis not present

## 2021-08-09 NOTE — Patient Instructions (Signed)
Good to see you! Thanks for making my day [facility] [phone] for epidural See you again within 2 months after epidural

## 2021-08-09 NOTE — Assessment & Plan Note (Signed)
Patient has made significant improvement again after the epidural.  Patient would like to have another one if possible.  Would like to be nearly pain-free and at the moment is about 75% improved.  Patient is doing well with the Cymbalta as well as the Zanaflex.  We do have room to go up on both of them if necessary.  Patient does have the restrictions at work if needed but has changed areas at work which I think will also be beneficial for him.  FMLA paperwork has been filled out previously as well.  Follow-up with me again 2 to 3 months otherwise.

## 2021-08-09 NOTE — Assessment & Plan Note (Signed)
Stable and improved at this time.  We will continue to monitor.  Follow-up again with me in 2 to 3 months.  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

## 2021-08-14 ENCOUNTER — Other Ambulatory Visit: Payer: Self-pay | Admitting: Family Medicine

## 2021-09-12 ENCOUNTER — Other Ambulatory Visit: Payer: Self-pay | Admitting: Family Medicine

## 2021-09-16 ENCOUNTER — Ambulatory Visit
Admission: RE | Admit: 2021-09-16 | Discharge: 2021-09-16 | Disposition: A | Discharge status: Home / Self Care | Payer: BC Managed Care – PPO | Source: Ambulatory Visit | Attending: Family Medicine | Admitting: Family Medicine

## 2021-09-16 ENCOUNTER — Other Ambulatory Visit: Payer: Self-pay

## 2021-09-16 DIAGNOSIS — S3422XD Injury of nerve root of sacral spine, subsequent encounter: Secondary | ICD-10-CM

## 2021-09-16 IMAGING — XA Imaging study
2 series · 2 of 2 positions shown · non-contrast
Comparison: none

CLINICAL DATA: Spondylosis without myelopathy. Good response from
previous injections but with mild residual left buttock and leg
discomfort.

[Series 1: ortho standard · 1 of 1 slices shown (1 of 2)]
[im 1/1]
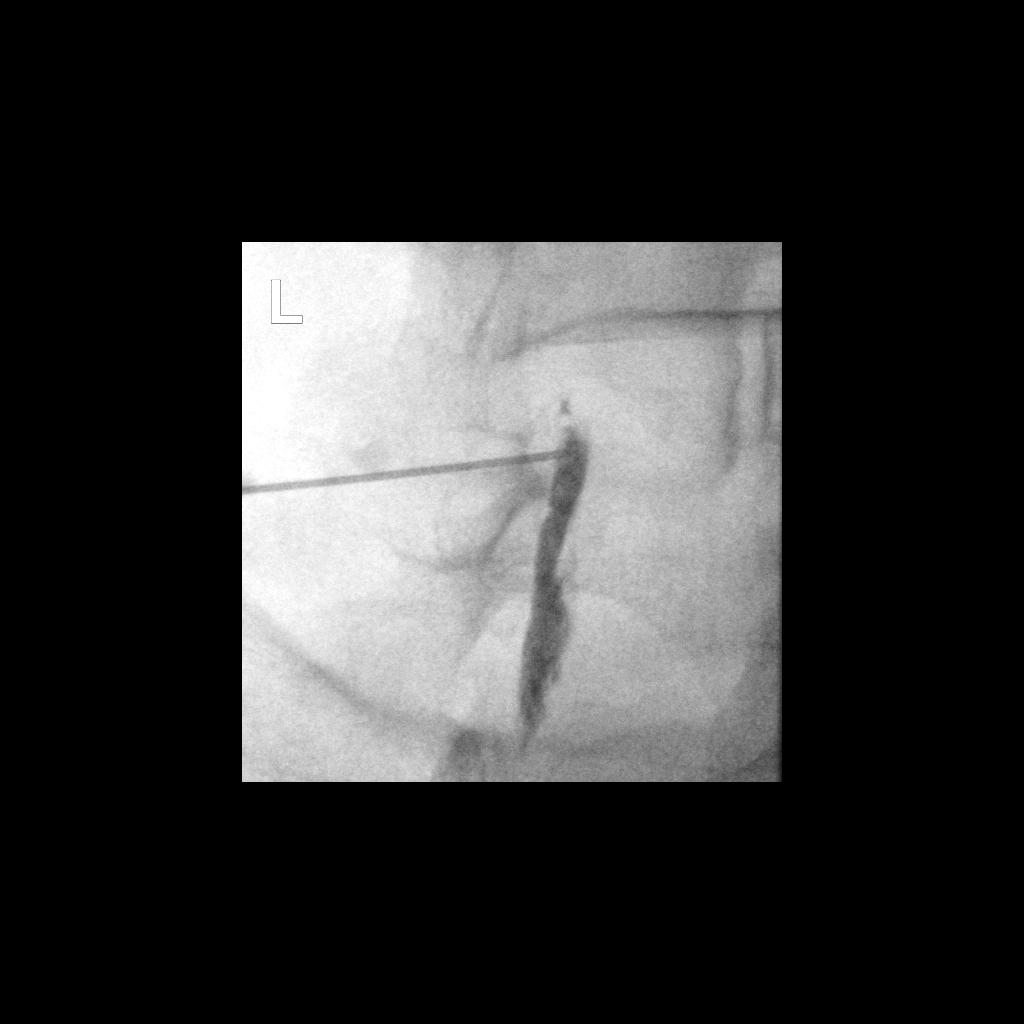

[Series 2: ortho standard · 1 of 1 slices shown (2 of 2)]
[im 1/1]
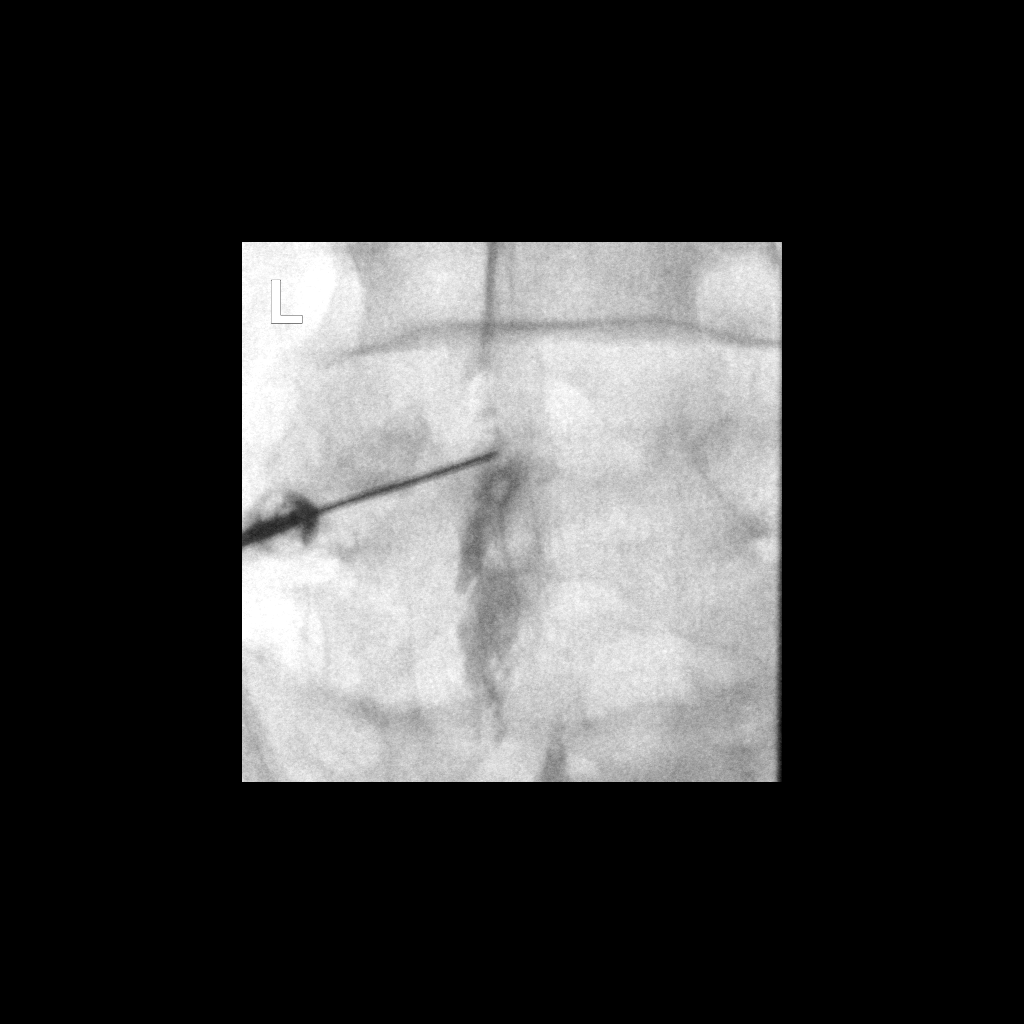

[2 of 2 positions shown; findings below may reference images not displayed]

FLUOROSCOPY TIME:  0 minutes 11 seconds. 7.57 micro gray meter
squared

PROCEDURE:
The procedure, risks, benefits, and alternatives were explained to
the patient. Questions regarding the procedure were encouraged and
answered. The patient understands and consents to the procedure.

LUMBAR EPIDURAL INJECTION:

An interlaminar approach was performed on the left at L4-5. The
overlying skin was cleansed and anesthetized. A 20 gauge epidural
needle was advanced using loss-of-resistance technique.

DIAGNOSTIC EPIDURAL INJECTION:

Injection of Isovue-M 200 shows a good epidural pattern with spread
above and below the level of needle placement, primarily on the
left. No vascular opacification is seen.

THERAPEUTIC EPIDURAL INJECTION:

Eighty mg of Depo-Medrol mixed with 2.5 cc 1% lidocaine were
instilled. The procedure was well-tolerated, and the patient was
discharged thirty minutes following the injection in good condition.

COMPLICATIONS:
None
IMPRESSION: Technically successful epidural injection on the left at L4-5 # 3

## 2021-09-16 MED ORDER — IOPAMIDOL (ISOVUE-M 200) INJECTION 41%
1.0000 mL | Freq: Once | INTRAMUSCULAR | Status: AC
  Administered 2021-09-16: 1 mL via EPIDURAL

## 2021-09-16 MED ORDER — METHYLPREDNISOLONE ACETATE 40 MG/ML INJ SUSP (RADIOLOG
80.0000 mg | Freq: Once | INTRAMUSCULAR | Status: AC
  Administered 2021-09-16: 80 mg via EPIDURAL

## 2021-09-16 NOTE — Discharge Instructions (Signed)

## 2021-10-08 ENCOUNTER — Other Ambulatory Visit: Payer: Self-pay | Admitting: Family Medicine

## 2021-11-02 ENCOUNTER — Other Ambulatory Visit: Payer: Self-pay | Admitting: Family Medicine

## 2021-11-24 ENCOUNTER — Other Ambulatory Visit: Payer: Self-pay | Admitting: Family Medicine

## 2021-12-03 ENCOUNTER — Encounter: Payer: Self-pay | Admitting: Family Medicine

## 2021-12-20 ENCOUNTER — Telehealth: Payer: Self-pay | Admitting: Family Medicine

## 2021-12-20 NOTE — Telephone Encounter (Signed)
Patient called stating that he has an appointment on Friday with Dr Tamala Julian. He wanted to make sure that we had received the most recent FMLA forms from [employer]? ? ?Please advise. ? ?

## 2021-12-21 NOTE — Telephone Encounter (Signed)
Faxed and patient notified. 

## 2021-12-23 NOTE — Progress Notes (Signed)
?Charlann Boxer D.O. ?[facility] ?[place] ?Phone: [phone] ?Subjective:   ?I, Vilma Meckel, am serving as a Education administrator for Dr. Hulan Saas. ?This visit occurred during the SARS-CoV-2 public health emergency.  Safety protocols were in place, including screening questions prior to the visit, additional usage of staff PPE, and extensive cleaning of exam room while observing appropriate contact time as indicated for disinfecting solutions.  ? ?I'm seeing this patient by the request  of:  Denita Lung, MD ? ?CC: Low back pain follow-up ? ?[id number]  ?08/09/2021 ?Patient has made significant improvement again after the epidural.  Patient would like to have another one if possible.  Would like to be nearly pain-free and at the moment is about 75% improved.  Patient is doing well with the Cymbalta as well as the Zanaflex.  We do have room to go up on both of them if necessary.  Patient does have the restrictions at work if needed but has changed areas at work which I think will also be beneficial for him.  FMLA paperwork has been filled out previously as well.  Follow-up with me again 2 to 3 months otherwise. ? ?Update 12/24/2021 ?HURSCHEL PAYNTER is a 61 y.o. male coming in with complaint of lumbar spine pain. Patient states overall better. LBP left glute and hip doing better. Bone spur in left hand. Feet and knee still have issues. FMLA re certification. Faxed back for more information. Wants him to work 11 hours 7 days a week.  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  Patient does not always use it but sometimes when he is having worsening pain and does need 1 or 2 days off.  Still goes through all his medications before he uses this FMLA.  Patient is just concerned because he is doing well and does not want to reaggravated. ? ? ?  ? ?Past Medical History:  ?Diagnosis Date  ? Anemia   ? Central serous  retinopathy   ? hx/o intraocular injection therapy for 3 years; no change after 3 years of therapy, no visual c/o  ? Depression   ? Former smoker   ? History of blood transfusion 04/17/2017  ? "related to anemia"  ? History of kidney stones   ? Hypertension   ? Left varicocele   ? Port-wine stain of face   ? Pre-diabetes   ? ?Past Surgical History:  ?Procedure Laterality Date  ? COLONOSCOPY    ? never  ? EXTRACORPOREAL SHOCK WAVE LITHOTRIPSY    ? PALATE / UVULA BIOPSY / EXCISION  2006  ? "polyp on uvula cut out"  ? ?Social History  ? ?Socioeconomic History  ? Marital status: Divorced  ?  Spouse name: Not on file  ? Number of children: Not on file  ? Years of education: Not on file  ? Highest education level: Not on file  ?Occupational History  ? Not on file  ?Tobacco Use  ? Smoking status: Former  ?  Packs/day: 1.00  ?  Years: 15.00  ?  Pack years: 15.00  ?  Types: Cigarettes  ?  Quit date: 12/01/1997  ?  Years since quitting: 24.0  ? Smokeless tobacco: Never  ?Vaping Use  ? Vaping Use: Never used  ?Substance and Sexual Activity  ? Alcohol use: Yes  ?  Comment: Beer.  ? Drug use: No  ? Sexual activity: Not Currently  ?  Other Topics Concern  ? Not on file  ?Social History Narrative  ? Divorced 04/2012, 2 children ages daughter 3yo, son 37yo; exercise - moving on the job, walking, works as Electrical engineer  ? ?Social Determinants of Health  ? ?Financial Resource Strain: Not on file  ?Food Insecurity: Not on file  ?Transportation Needs: Not on file  ?Physical Activity: Not on file  ?Stress: Not on file  ?Social Connections: Not on file  ? ?Allergies  ?Allergen Reactions  ? Penicillins Other (See Comments)  ?  Hands & feet peel. "childhood allergy"  ? ?Family History  ?Problem Relation Age of Onset  ? Heart disease Mother   ?     pacemaker  ? Heart disease Father 94  ?     died of MI, 74s  ? Diabetes Father   ? Stroke Father   ? Migraines Sister   ? Mental illness Sister   ? Cancer Neg Hx   ? Hyperlipidemia Neg Hx   ?  Hypertension Neg Hx   ? Colon cancer Neg Hx   ? ? ? ?Current Outpatient Medications (Cardiovascular):  ?  amLODipine (NORVASC) 5 MG tablet, Take 1 tablet (5 mg total) by mouth daily. ?  lisinopril-hydrochlorothiazide (ZESTORETIC) 20-12.5 MG tablet, Take 1 tablet by mouth daily. ? ? ? ? ?Current Outpatient Medications (Other):  ?  DULoxetine (CYMBALTA) 20 MG capsule, TAKE 1 CAPSULE BY MOUTH EVERY DAY ?  tiZANidine (ZANAFLEX) 2 MG tablet, TAKE 1 TABLET BY MOUTH EVERYDAY AT BEDTIME ? ? ?Reviewed prior external information including notes and imaging from  ?primary care provider ?As well as notes that were available from care everywhere and other healthcare systems. ? ?Past medical history, social, surgical and family history all reviewed in electronic medical record.  No pertanent information unless stated regarding to the chief complaint.  ? ?Review of Systems: ? No headache, visual changes, nausea, vomiting, diarrhea, constipation, dizziness, abdominal pain, skin rash, fevers, chills, night sweats, weight loss, swollen lymph nodes, body aches, joint swelling, chest pain, shortness of breath, mood changes. POSITIVE muscle aches ? ?Objective  ?Blood pressure 134/88, pulse 80, height '6\' 1"'$  (1.854 m), weight 198 lb (89.8 kg), SpO2 97 %. ?  ?General: No apparent distress alert and oriented x3 mood and affect normal, dressed appropriately.  ?HEENT: Pupils equal, extraocular movements intact  ?Respiratory: Patient's speak in full sentences and does not appear short of breath  ?Cardiovascular: No lower extremity edema, non tender, no erythema  ?Gait normal with good balance and coordination.  ?MSK: Low back exam does have loss of lordosis.  Some tenderness to palpation in the paraspinal musculature.  Patient though is sitting comfortably otherwise.  Patient seems more comfortable than some other visits we have seen him with. ? ?  ?Impression and Recommendations:  ?  ?The above documentation has been reviewed and is accurate  and complete Lyndal Pulley, DO ? ? ? ?

## 2021-12-24 ENCOUNTER — Other Ambulatory Visit: Payer: Self-pay

## 2021-12-24 ENCOUNTER — Ambulatory Visit (INDEPENDENT_AMBULATORY_CARE_PROVIDER_SITE_OTHER): Payer: BC Managed Care – PPO | Admitting: Family Medicine

## 2021-12-24 DIAGNOSIS — S3422XD Injury of nerve root of sacral spine, subsequent encounter: Secondary | ICD-10-CM

## 2021-12-24 NOTE — Patient Instructions (Addendum)
See you again in 4 months ?Do prescribed exercises at least 3x a week ? ?[phone] is our fax number ?

## 2021-12-24 NOTE — Assessment & Plan Note (Addendum)
Patient has made improvements with the injections at this time.  Patient though has noticed though if he works too many hours in a row he does seem to have aggravation of it.  Patient has had FMLA paperwork that have allowed him to potentially take 1 to 2 days off occasionally throughout the month.  This is allowed patient to continue to work at a very high rate.  Patient is also responding well to the Cymbalta.  We will make no other significant changes and will continue the patient on the same mild restrictions.  Patient will continue to stay active and follow-up with me again 2 to 3 months.  Total time reviewing the chart, patient's previous FMLA paperwork, previous imaging and treatment options greater than 31 minutes ?

## 2021-12-26 LAB — COLOGUARD: COLOGUARD: POSITIVE — AB

## 2021-12-27 ENCOUNTER — Telehealth: Payer: Self-pay | Admitting: Family Medicine

## 2021-12-27 NOTE — Telephone Encounter (Signed)
Colletta Maryland with [employer] called asking for clarification on the most recent form that was submitted. ?Call back # [phone] ?

## 2021-12-27 NOTE — Telephone Encounter (Signed)
Attempted to call [employer]. On hold for 10 minutes with no answer.  ?

## 2021-12-29 ENCOUNTER — Other Ambulatory Visit: Payer: Self-pay | Admitting: Family Medicine

## 2022-01-03 ENCOUNTER — Other Ambulatory Visit: Payer: Self-pay

## 2022-01-03 ENCOUNTER — Telehealth: Payer: Self-pay | Admitting: Family Medicine

## 2022-01-03 DIAGNOSIS — R195 Other fecal abnormalities: Secondary | ICD-10-CM

## 2022-01-03 NOTE — Telephone Encounter (Signed)
Pt called and would like to be referred to have a colonscopy with a Dr Eusebio Friendly  [address] [place], [place] [address] ? ?

## 2022-01-03 NOTE — Telephone Encounter (Signed)
Done and order put in. [facility] ?

## 2022-02-01 ENCOUNTER — Other Ambulatory Visit: Payer: Self-pay | Admitting: Family Medicine

## 2022-02-17 DIAGNOSIS — R195 Other fecal abnormalities: Secondary | ICD-10-CM | POA: Insufficient documentation

## 2022-02-27 ENCOUNTER — Other Ambulatory Visit: Payer: Self-pay | Admitting: Family Medicine

## 2022-03-27 ENCOUNTER — Other Ambulatory Visit: Payer: Self-pay | Admitting: Family Medicine

## 2022-04-24 ENCOUNTER — Other Ambulatory Visit: Payer: Self-pay | Admitting: Family Medicine

## 2022-04-28 NOTE — Progress Notes (Signed)
[facility] [place] [address] [place] Phone: [phone] Subjective:   [name], am serving as a scribe for Dr. Hulan Saas.  I'm seeing this patient by the request  of:  Denita Lung, MD  CC: Low back and knee pain follow-up  [id number]  12/24/2021 Patient has made improvements with the injections at this time.  Patient though has noticed though if he works too many hours in a row he does seem to have aggravation of it.  Patient has had FMLA paperwork that have allowed him to potentially take 1 to 2 days off occasionally throughout the month.  This is allowed patient to continue to work at a very high rate.  Patient is also responding well to the Cymbalta.  We will make no other significant changes and will continue the patient on the same mild restrictions.  Patient will continue to stay active and follow-up with me again 2 to 3 months.  Total time reviewing the chart, patient's previous FMLA paperwork, previous imaging and treatment options greater than 31 minutes  Update 04/29/2022 Jeffery [name] is a 61 y.o. male coming in with complaint of LBP. Patient states feels like its getting better. Knees feel better as well. The longer hours is what causes the most pain. FMLA needs to be redone.  Patient states that he was told that he is potentially out of time at the moment.  Patient states overall he does relatively well but still if he works too many shifts in a row or stands a significant amount of time sometimes he does need to miss a shift here and there.      Past Medical History:  Diagnosis Date   Anemia    Central serous retinopathy    hx/o intraocular injection therapy for 3 years; no change after 3 years of therapy, no visual c/o   Depression    Former smoker    History of blood transfusion 04/17/2017   "related to anemia"   History of kidney stones    Hypertension    Left varicocele    Port-wine stain of face     Pre-diabetes    Past Surgical History:  Procedure Laterality Date   COLONOSCOPY     never   EXTRACORPOREAL SHOCK WAVE LITHOTRIPSY     PALATE / UVULA BIOPSY / EXCISION  2006   "polyp on uvula cut out"   Social History   Socioeconomic History   Marital status: Divorced    Spouse name: Not on file   Number of children: Not on file   Years of education: Not on file   Highest education level: Not on file  Occupational History   Not on file  Tobacco Use   Smoking status: Former    Packs/day: 1.00    Years: 15.00    Total pack years: 15.00    Types: Cigarettes    Quit date: 12/01/1997    Years since quitting: 24.4   Smokeless tobacco: Never  Vaping Use   Vaping Use: Never used  Substance and Sexual Activity   Alcohol use: Yes    Comment: Beer.   Drug use: No   Sexual activity: Not Currently  Other Topics Concern   Not on file  Social History Narrative   Divorced 04/2012, 2 children ages daughter 47yo, son 28yo; exercise - moving on the job, walking, works as Electrical engineer   Social Determinants of Radio broadcast assistant Strain: Not on  file  Food Insecurity: Not on file  Transportation Needs: Not on file  Physical Activity: Not on file  Stress: Not on file  Social Connections: Not on file   Allergies  Allergen Reactions   Penicillins Other (See Comments)    Hands & feet peel. "childhood allergy"   Family History  Problem Relation Age of Onset   Heart disease Mother        pacemaker   Heart disease Father 50       died of MI, 51s   Diabetes Father    Stroke Father    Migraines Sister    Mental illness Sister    Cancer Neg Hx    Hyperlipidemia Neg Hx    Hypertension Neg Hx    Colon cancer Neg Hx      Current Outpatient Medications (Cardiovascular):    amLODipine (NORVASC) 5 MG tablet, Take 1 tablet (5 mg total) by mouth daily.   lisinopril-hydrochlorothiazide (ZESTORETIC) 20-12.5 MG tablet, Take 1 tablet by mouth daily.     Current Outpatient  Medications (Other):    DULoxetine (CYMBALTA) 20 MG capsule, TAKE 1 CAPSULE BY MOUTH EVERY DAY   tiZANidine (ZANAFLEX) 2 MG tablet, TAKE 1 TABLET BY MOUTH EVERYDAY AT BEDTIME   Reviewed prior external information including notes and imaging from  primary care provider As well as notes that were available from care everywhere and other healthcare systems.  Past medical history, social, surgical and family history all reviewed in electronic medical record.  No pertanent information unless stated regarding to the chief complaint.   Review of Systems:  No headache, visual changes, nausea, vomiting, diarrhea, constipation, dizziness, abdominal pain, skin rash, fevers, chills, night sweats, weight loss, swollen lymph nodes, b joint swelling, chest pain, shortness of breath, mood changes. POSITIVE muscle aches, body aches  Objective  Blood pressure 116/78, pulse 70, height '6\' 1"'$  (1.854 m), weight 196 lb (88.9 kg), SpO2 95 %.   General: No apparent distress alert and oriented x3 mood and affect normal, dressed appropriately.  HEENT: Pupils equal, extraocular movements intact  Respiratory: Patient's speak in full sentences and does not appear short of breath  Cardiovascular: No lower extremity edema, non tender, no erythema  Low back exam does have some improvement in core strength.  Patient with very mild tightness with FABER test on the right.  Patient states that the small spasm he has had previously in the back is smaller as well.  Left knee exam has great range of motion with no significant instability.    Impression and Recommendations:    The above documentation has been reviewed and is accurate and complete Lyndal Pulley, DO

## 2022-04-29 ENCOUNTER — Ambulatory Visit (INDEPENDENT_AMBULATORY_CARE_PROVIDER_SITE_OTHER): Payer: BC Managed Care – PPO | Admitting: Family Medicine

## 2022-04-29 DIAGNOSIS — Z9889 Other specified postprocedural states: Secondary | ICD-10-CM

## 2022-04-29 DIAGNOSIS — S3422XD Injury of nerve root of sacral spine, subsequent encounter: Secondary | ICD-10-CM | POA: Diagnosis not present

## 2022-04-29 NOTE — Patient Instructions (Signed)
See me again in November

## 2022-04-29 NOTE — Assessment & Plan Note (Signed)
Much better at this time.  Follow-up with me as needed.

## 2022-04-29 NOTE — Assessment & Plan Note (Signed)
Patient is doing relatively well with the conservative therapy.  Still on the Cymbalta.  Discussed with patient that we can continue the same restrictions at work.  Patient notices if he does too many shifts in a row or standing on amount of time he starts having increasing discomfort.  Patient should have FMLA until November.  We will follow-up in November to further evaluate.  Total time reviewing chart and previous paperwork as well as discussing with patient 31 minutes

## 2022-05-26 ENCOUNTER — Other Ambulatory Visit: Payer: Self-pay | Admitting: Family Medicine

## 2022-06-08 ENCOUNTER — Encounter: Payer: Self-pay | Admitting: Internal Medicine

## 2022-06-08 ENCOUNTER — Other Ambulatory Visit: Payer: Self-pay | Admitting: Family Medicine

## 2022-06-16 ENCOUNTER — Other Ambulatory Visit: Payer: Self-pay | Admitting: Family Medicine

## 2022-06-16 DIAGNOSIS — I10 Essential (primary) hypertension: Secondary | ICD-10-CM

## 2022-07-25 ENCOUNTER — Encounter: Payer: Self-pay | Admitting: Internal Medicine

## 2022-08-01 NOTE — Progress Notes (Signed)
[name] [place] Phone: [phone] Subjective:   Jeffery [name], am serving as a scribe for Dr. Hulan Saas.  I'm seeing this patient by the request  of:  Denita Lung, MD  CC: left knee and SI joint   [id number]  04/29/2022 Patient is doing relatively well with the conservative therapy.  Still on the Cymbalta.  Discussed with patient that we can continue the same restrictions at work.  Patient notices if he does too many shifts in a row or standing on amount of time he starts having increasing discomfort.  Patient should have FMLA until November.  We will follow-up in November to further evaluate.  Total time reviewing chart and previous paperwork as well as discussing with patient 31 minutes  Much better at this time.  Follow-up with me as needed.  Update 08/08/2022 Jeffery [name] is a 61 y.o. male coming in with complaint of L knee and SI joint pain. Patient states the knee has been doing alright but there has been some pain and swelling for about a month and that has been scaring him. Patient has been off work since Friday so the rest has helped the pain. Patient also wants to talk about his lower back pain/left side of the back. And the right foot pain he was interested in talking about that injection  that was brought up at his last visit.       Past Medical History:  Diagnosis Date   Anemia    Central serous retinopathy    hx/o intraocular injection therapy for 3 years; no change after 3 years of therapy, no visual c/o   Depression    Former smoker    History of blood transfusion 04/17/2017   "related to anemia"   History of kidney stones    Hypertension    Left varicocele    Port-wine stain of face    Pre-diabetes    Past Surgical History:  Procedure Laterality Date   COLONOSCOPY     never   EXTRACORPOREAL SHOCK WAVE LITHOTRIPSY     PALATE / UVULA BIOPSY / EXCISION  2006   "polyp on  uvula cut out"   Social History   Socioeconomic History   Marital status: Divorced    Spouse name: Not on file   Number of children: Not on file   Years of education: Not on file   Highest education level: Not on file  Occupational History   Not on file  Tobacco Use   Smoking status: Former    Packs/day: 1.00    Years: 15.00    Total pack years: 15.00    Types: Cigarettes    Quit date: 12/01/1997    Years since quitting: 24.7   Smokeless tobacco: Never  Vaping Use   Vaping Use: Never used  Substance and Sexual Activity   Alcohol use: Yes    Comment: Beer.   Drug use: No   Sexual activity: Not Currently  Other Topics Concern   Not on file  Social History Narrative   Divorced 04/2012, 2 children ages daughter 53yo, son 59yo; exercise - moving on the job, walking, works as Electrical engineer   Social Determinants of Radio broadcast assistant Strain: Not on file  Food Insecurity: Not on file  Transportation Needs: Not on file  Physical Activity: Not on file  Stress: Not on file  Social Connections: Not on file   Allergies  Allergen  Reactions   Penicillins Other (See Comments)    Hands & feet peel. "childhood allergy"   Family History  Problem Relation Age of Onset   Heart disease Mother        pacemaker   Heart disease Father 19       died of MI, 53s   Diabetes Father    Stroke Father    Migraines Sister    Mental illness Sister    Cancer Neg Hx    Hyperlipidemia Neg Hx    Hypertension Neg Hx    Colon cancer Neg Hx      Current Outpatient Medications (Cardiovascular):    amLODipine (NORVASC) 5 MG tablet, TAKE 1 TABLET (5 MG TOTAL) BY MOUTH DAILY.   lisinopril-hydrochlorothiazide (ZESTORETIC) 20-12.5 MG tablet, TAKE 1 TABLET BY MOUTH EVERY DAY     Current Outpatient Medications (Other):    DULoxetine (CYMBALTA) 20 MG capsule, TAKE 1 CAPSULE BY MOUTH EVERY DAY   tiZANidine (ZANAFLEX) 2 MG tablet, TAKE 1 TABLET BY MOUTH EVERYDAY AT BEDTIME   Reviewed  prior external information including notes and imaging from  primary care provider As well as notes that were available from care everywhere and other healthcare systems.  Past medical history, social, surgical and family history all reviewed in electronic medical record.  No pertanent information unless stated regarding to the chief complaint.   Review of Systems:  No headache, visual changes, nausea, vomiting, diarrhea, constipation, dizziness, abdominal pain, skin rash, fevers, chills, night sweats, weight loss, swollen lymph nodes, body aches, joint swelling, chest pain, shortness of breath, mood changes. POSITIVE muscle aches  Objective  Blood pressure 124/80, pulse 72, height '6\' 1"'$  (1.854 m), weight 190 lb (86.2 kg), SpO2 98 %.   General: No apparent distress alert and oriented x3 mood and affect normal, dressed appropriately.  HEENT: Pupils equal, extraocular movements intact  Respiratory: Patient's speak in full sentences and does not appear short of breath  Cardiovascular: No lower extremity edema, non tender, no erythema  Back exam does have some loss of lordosis.  Patient is minorly tender to palpation over the sacroiliac joints. 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  Patient does have tenderness to palpation over the fourth and fifth as well as the third and fourth intermetatarsal spaces.  Positive squeeze test noted.   After verbal consent patient was prepped with alcohol swab and with a 25-gauge half inch needle injected with 0.5 cc of 0.5% Marcaine and 0.5 cc of Kenalog 40 mg in the third intermetatarsal space.  No blood loss.  Band-Aid placed.  Postinjection instructions given    Impression and Recommendations:    The above documentation has been reviewed and is accurate and complete Lyndal Pulley, DO

## 2022-08-08 ENCOUNTER — Ambulatory Visit: Payer: BC Managed Care – PPO

## 2022-08-08 ENCOUNTER — Ambulatory Visit (INDEPENDENT_AMBULATORY_CARE_PROVIDER_SITE_OTHER): Payer: BC Managed Care – PPO | Admitting: Family Medicine

## 2022-08-08 ENCOUNTER — Encounter: Payer: Self-pay | Admitting: Family Medicine

## 2022-08-08 VITALS — BP 124/80 | HR 72 | Ht 73.0 in | Wt 190.0 lb

## 2022-08-08 DIAGNOSIS — S3422XD Injury of nerve root of sacral spine, subsequent encounter: Secondary | ICD-10-CM

## 2022-08-08 DIAGNOSIS — M25562 Pain in left knee: Secondary | ICD-10-CM

## 2022-08-08 DIAGNOSIS — D3610 Benign neoplasm of peripheral nerves and autonomic nervous system, unspecified: Secondary | ICD-10-CM | POA: Diagnosis not present

## 2022-08-08 NOTE — Assessment & Plan Note (Signed)
Chronic problem with exacerbation.  Given an injection.  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  We discussed with patient about icing regimen and home exercises otherwise.  Which activities to do and which ones to avoid.  Increase activity slowly otherwise.  Discussed proper shoes at work.  Follow-up with me again in 6 to 8 weeks

## 2022-08-08 NOTE — Assessment & Plan Note (Signed)
Patient likely is doing relatively well.  We will continue the same restrictions at work at the moment.  We did discuss ergonomics and having more of a Bluetooth keyboard that I think will be beneficial.  Patient will continue to increase activity otherwise.  Follow-up with me again in 3 to 4 months

## 2022-08-08 NOTE — Patient Instructions (Signed)
Good to see you Injection given in right foot today Recovery sandals in the house Follow up in 4 months

## 2022-08-09 ENCOUNTER — Telehealth: Payer: Self-pay | Admitting: Family Medicine

## 2022-08-09 NOTE — Telephone Encounter (Signed)
Pt concerned about lack of pain with shot we gave him yesterday in his foot.  States Dr. Tamala Julian advised that shot would hurt and that he should expect pain over the following 6 hours. Pt has experienced none of this, foot feels better. Seems to want to confirm that the lack of pain is not concerning.  Ok to respond via [place].

## 2022-08-17 ENCOUNTER — Encounter: Payer: Self-pay | Admitting: Internal Medicine

## 2022-08-17 ENCOUNTER — Ambulatory Visit: Payer: BC Managed Care – PPO | Admitting: Internal Medicine

## 2022-08-17 VITALS — BP 146/74 | HR 71 | Temp 97.7°F | Resp 16 | Ht 73.0 in | Wt 187.5 lb

## 2022-08-17 DIAGNOSIS — K4090 Unilateral inguinal hernia, without obstruction or gangrene, not specified as recurrent: Secondary | ICD-10-CM | POA: Diagnosis not present

## 2022-08-17 DIAGNOSIS — R7303 Prediabetes: Secondary | ICD-10-CM

## 2022-08-17 DIAGNOSIS — I10 Essential (primary) hypertension: Secondary | ICD-10-CM | POA: Diagnosis not present

## 2022-08-17 DIAGNOSIS — Z0001 Encounter for general adult medical examination with abnormal findings: Secondary | ICD-10-CM | POA: Diagnosis not present

## 2022-08-17 LAB — URINALYSIS, ROUTINE W REFLEX MICROSCOPIC
Bilirubin Urine: NEGATIVE
Hgb urine dipstick: NEGATIVE
Ketones, ur: NEGATIVE
Leukocytes,Ua: NEGATIVE
Nitrite: NEGATIVE
RBC / HPF: NONE SEEN (ref 0–?)
Specific Gravity, Urine: 1.01 (ref 1.000–1.030)
Total Protein, Urine: NEGATIVE
Urine Glucose: NEGATIVE
Urobilinogen, UA: 0.2 (ref 0.0–1.0)
pH: 7 (ref 5.0–8.0)

## 2022-08-17 LAB — BASIC METABOLIC PANEL
BUN: 14 mg/dL (ref 6–23)
CO2: 29 mEq/L (ref 19–32)
Calcium: 10 mg/dL (ref 8.4–10.5)
Chloride: 102 mEq/L (ref 96–112)
Creatinine, Ser: 1.06 mg/dL (ref 0.40–1.50)
GFR: 76.78 mL/min (ref >60.00)
Glucose, Bld: 113 mg/dL — ABNORMAL HIGH (ref 70–99)
Potassium: 4.1 mEq/L (ref 3.5–5.1)
Sodium: 139 mEq/L (ref 135–145)

## 2022-08-17 LAB — CBC WITH DIFFERENTIAL/PLATELET
Basophils Absolute: 0.1 10*3/uL (ref 0.0–0.1)
Basophils Relative: 1.3 % (ref 0.0–3.0)
Eosinophils Absolute: 0.1 10*3/uL (ref 0.0–0.7)
Eosinophils Relative: 3 % (ref 0.0–5.0)
HCT: 43.3 % (ref 39.0–52.0)
Hemoglobin: 15 g/dL (ref 13.0–17.0)
Lymphocytes Relative: 25.4 % (ref 12.0–46.0)
Lymphs Abs: 1.1 10*3/uL (ref 0.7–4.0)
MCHC: 34.6 g/dL (ref 30.0–36.0)
MCV: 91.3 fl (ref 78.0–100.0)
Monocytes Absolute: 0.6 10*3/uL (ref 0.1–1.0)
Monocytes Relative: 13.9 % — ABNORMAL HIGH (ref 3.0–12.0)
Neutro Abs: 2.6 10*3/uL (ref 1.4–7.7)
Neutrophils Relative %: 56.4 % (ref 43.0–77.0)
Platelets: 275 10*3/uL (ref 150.0–400.0)
RBC: 4.74 Mil/uL (ref 4.22–5.81)
RDW: 12.6 % (ref 11.5–15.5)
WBC: 4.5 10*3/uL (ref 4.0–10.5)

## 2022-08-17 LAB — HEPATIC FUNCTION PANEL
ALT: 16 U/L (ref 0–53)
AST: 16 U/L (ref 0–37)
Albumin: 4.8 g/dL (ref 3.5–5.2)
Alkaline Phosphatase: 95 U/L (ref 39–117)
Bilirubin, Direct: 0.1 mg/dL (ref 0.0–0.3)
Total Bilirubin: 0.7 mg/dL (ref 0.2–1.2)
Total Protein: 7.3 g/dL (ref 6.0–8.3)

## 2022-08-17 LAB — HEMOGLOBIN A1C: Hgb A1c MFr Bld: 6.3 % (ref 4.6–6.5)

## 2022-08-17 LAB — PSA: PSA: 1.25 ng/mL (ref 0.10–4.00)

## 2022-08-17 LAB — TSH: TSH: 0.91 u[IU]/mL (ref 0.35–5.50)

## 2022-08-17 NOTE — Progress Notes (Unsigned)
Subjective:  Patient ID: NAREK KNISS, male    DOB: 02/12/1961  Age: 61 y.o. MRN: [id number]  CC: No chief complaint on file.   HPI JARIOUS LYON presents for ***  History Albertina Parr has a past medical history of Anemia, Central serous retinopathy, Depression, Former smoker, History of blood transfusion (04/17/2017), History of kidney stones, Hypertension, Left varicocele, Port-wine stain of face, and Pre-diabetes.   He has a past surgical history that includes Colonoscopy; Palate / uvula biopsy / excision (2006); and Extracorporeal shock wave lithotripsy.   His family history includes Diabetes in his father; Heart disease in his mother; Heart disease (age of onset: 71) in his father; Mental illness in his sister; Migraines in his sister; Stroke in his father.He reports that he quit smoking about 24 years ago. His smoking use included cigarettes. He has a 15.00 pack-year smoking history. He has never used smokeless tobacco. He reports current alcohol use. He reports that he does not use drugs.  Outpatient Medications Prior to Visit  Medication Sig Dispense Refill   amLODipine (NORVASC) 5 MG tablet TAKE 1 TABLET (5 MG TOTAL) BY MOUTH DAILY. 90 tablet 0   DULoxetine (CYMBALTA) 20 MG capsule TAKE 1 CAPSULE BY MOUTH EVERY DAY 90 capsule 0   lisinopril-hydrochlorothiazide (ZESTORETIC) 20-12.5 MG tablet TAKE 1 TABLET BY MOUTH EVERY DAY 90 tablet 0   tiZANidine (ZANAFLEX) 2 MG tablet TAKE 1 TABLET BY MOUTH EVERYDAY AT BEDTIME 90 tablet 1   No facility-administered medications prior to visit.    ROS Review of Systems  Objective:  BP 128/78   Pulse 71   Temp 97.7 F (36.5 C) (Oral)   Ht '6\' 1"'$  (1.854 m)   Wt 187 lb 8 oz (85 kg)   SpO2 98%   BMI 24.74 kg/m   Physical Exam Abdominal:     Hernia: A hernia is present. Hernia is present in the right inguinal area. There is no hernia in the left inguinal area.  Genitourinary:    Penis: Normal and circumcised.      Testes:  Normal.        Right: Mass, tenderness or swelling not present.        Left: Mass, tenderness or swelling not present.     Epididymis:     Right: Normal.     Left: Normal.  Lymphadenopathy:     Lower Body: No right inguinal adenopathy. No left inguinal adenopathy.     Lab Results  Component Value Date   WBC 7.1 07/15/2021   HGB 14.4 07/15/2021   HCT 41.8 07/15/2021   PLT 270 07/15/2021   GLUCOSE 102 (H) 07/15/2021   CHOL 191 07/15/2021   TRIG 49 07/15/2021   HDL 70 07/15/2021   LDLCALC 112 (H) 07/15/2021   ALT 17 07/15/2021   AST 16 07/15/2021   NA 138 07/15/2021   K 4.4 07/15/2021   CL 99 07/15/2021   CREATININE 1.04 07/15/2021   BUN 17 07/15/2021   CO2 23 07/15/2021   TSH 1.663 04/17/2017   PSA 0.90 02/08/2012   INR 1.01 06/26/2017   HGBA1C 6.0 (H) 07/15/2021     Assessment & Plan:   Diagnoses and all orders for this visit:  Essential hypertension, benign -     Basic metabolic panel; Future -     CBC with Differential/Platelet; Future -     TSH; Future -     Urinalysis, Routine w reflex microscopic; Future -     Hepatic function panel;  Future  Encounter for general adult medical examination with abnormal findings -     PSA; Future  Prediabetes -     Basic metabolic panel; Future -     Hemoglobin A1c; Future   I am having Margurite Auerbach. Ritson maintain his tiZANidine, amLODipine, DULoxetine, and lisinopril-hydrochlorothiazide.  No orders of the defined types were placed in this encounter.    Follow-up: No follow-ups on file.  Scarlette Calico, MD

## 2022-08-17 NOTE — Patient Instructions (Signed)
Health Maintenance, Male Adopting a healthy lifestyle and getting preventive care are important in promoting health and wellness. Ask your health care provider about: The right schedule for you to have regular tests and exams. Things you can do on your own to prevent diseases and keep yourself healthy. What should I know about diet, weight, and exercise? Eat a healthy diet  Eat a diet that includes plenty of vegetables, fruits, low-fat dairy products, and lean protein. Do not eat a lot of foods that are high in solid fats, added sugars, or sodium. Maintain a healthy weight Body mass index (BMI) is a measurement that can be used to identify possible weight problems. It estimates body fat based on height and weight. Your health care provider can help determine your BMI and help you achieve or maintain a healthy weight. Get regular exercise Get regular exercise. This is one of the most important things you can do for your health. Most adults should: Exercise for at least 150 minutes each week. The exercise should increase your heart rate and make you sweat (moderate-intensity exercise). Do strengthening exercises at least twice a week. This is in addition to the moderate-intensity exercise. Spend less time sitting. Even light physical activity can be beneficial. Watch cholesterol and blood lipids Have your blood tested for lipids and cholesterol at 59 years of age, then have this test every 5 years. You may need to have your cholesterol levels checked more often if: Your lipid or cholesterol levels are high. You are older than 59 years of age. You are at high risk for heart disease. What should I know about cancer screening? Many types of cancers can be detected early and may often be prevented. Depending on your health history and family history, you may need to have cancer screening at various ages. This may include screening for: Colorectal cancer. Prostate cancer. Skin cancer. Lung  cancer. What should I know about heart disease, diabetes, and high blood pressure? Blood pressure and heart disease High blood pressure causes heart disease and increases the risk of stroke. This is more likely to develop in people who have high blood pressure readings or are overweight. Talk with your health care provider about your target blood pressure readings. Have your blood pressure checked: Every 3-5 years if you are 18-39 years of age. Every year if you are 40 years old or older. If you are between the ages of 65 and 75 and are a current or former smoker, ask your health care provider if you should have a one-time screening for abdominal aortic aneurysm (AAA). Diabetes Have regular diabetes screenings. This checks your fasting blood sugar level. Have the screening done: Once every three years after age 45 if you are at a normal weight and have a low risk for diabetes. More often and at a younger age if you are overweight or have a high risk for diabetes. What should I know about preventing infection? Hepatitis B If you have a higher risk for hepatitis B, you should be screened for this virus. Talk with your health care provider to find out if you are at risk for hepatitis B infection. Hepatitis C Blood testing is recommended for: Everyone born from 1945 through 1965. Anyone with known risk factors for hepatitis C. Sexually transmitted infections (STIs) You should be screened each year for STIs, including gonorrhea and chlamydia, if: You are sexually active and are younger than 59 years of age. You are older than 59 years of age and your   health care provider tells you that you are at risk for this type of infection. Your sexual activity has changed since you were last screened, and you are at increased risk for chlamydia or gonorrhea. Ask your health care provider if you are at risk. Ask your health care provider about whether you are at high risk for HIV. Your health care provider  may recommend a prescription medicine to help prevent HIV infection. If you choose to take medicine to prevent HIV, you should first get tested for HIV. You should then be tested every 3 months for as long as you are taking the medicine. Follow these instructions at home: Alcohol use Do not drink alcohol if your health care provider tells you not to drink. If you drink alcohol: Limit how much you have to 0-2 drinks a day. Know how much alcohol is in your drink. In the [place] one drink equals one 12 oz bottle of beer (355 mL), one 5 oz glass of wine (148 mL), or one 1 oz glass of hard liquor (44 mL). Lifestyle Do not use any products that contain nicotine or tobacco. These products include cigarettes, chewing tobacco, and vaping devices, such as e-cigarettes. If you need help quitting, ask your health care provider. Do not use street drugs. Do not share needles. Ask your health care provider for help if you need support or information about quitting drugs. General instructions Schedule regular health, dental, and eye exams. Stay current with your vaccines. Tell your health care provider if: You often feel depressed. You have ever been abused or do not feel safe at home. Summary Adopting a healthy lifestyle and getting preventive care are important in promoting health and wellness. Follow your health care provider's instructions about healthy diet, exercising, and getting tested or screened for diseases. Follow your health care provider's instructions on monitoring your cholesterol and blood pressure. This information is not intended to replace advice given to you by your health care provider. Make sure you discuss any questions you have with your health care provider. Document Revised: 02/08/2021 Document Reviewed: 02/08/2021 Elsevier Patient Education  2023 Elsevier Inc.  

## 2022-09-11 ENCOUNTER — Other Ambulatory Visit: Payer: Self-pay | Admitting: Family Medicine

## 2022-09-15 DIAGNOSIS — K429 Umbilical hernia without obstruction or gangrene: Secondary | ICD-10-CM | POA: Insufficient documentation

## 2022-09-15 DIAGNOSIS — I10 Essential (primary) hypertension: Secondary | ICD-10-CM | POA: Insufficient documentation

## 2022-10-11 ENCOUNTER — Other Ambulatory Visit: Payer: Self-pay | Admitting: Family Medicine

## 2022-10-12 ENCOUNTER — Other Ambulatory Visit: Payer: Self-pay | Admitting: Family Medicine

## 2022-10-12 DIAGNOSIS — I10 Essential (primary) hypertension: Secondary | ICD-10-CM

## 2022-10-21 ENCOUNTER — Other Ambulatory Visit: Payer: Self-pay | Admitting: Internal Medicine

## 2022-10-21 DIAGNOSIS — I10 Essential (primary) hypertension: Secondary | ICD-10-CM

## 2022-12-06 NOTE — Progress Notes (Unsigned)
[facility] [place] Phone: [phone] Subjective:   Jeffery [name], am serving as a scribe for Dr. Hulan Saas.  I'm seeing this patient by the request  of:  Jeffery Lima, MD  CC: Foot pain follow-up,  [id number]  08/08/2022 Chronic problem with exacerbation. Given an injection. 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 We discussed with patient about icing regimen and home exercises otherwise. Which activities to do and which ones to avoid. Increase activity slowly otherwise. Discussed proper shoes at work. Follow-up with me again in 6 to 8 weeks   Patient likely is doing relatively well.  We will continue the same restrictions at work at the moment.  We did discuss ergonomics and having more of a Bluetooth keyboard that I think will be beneficial.  Patient will continue to increase activity otherwise.  Follow-up with me again in 3 to 4 months      Update 12/07/2022 Jeffery [name] is a 62 y.o. male coming in with complaint of R foot, L knee and LBP. Patient states that he had hernia surgery in December and is still recovering from that surgery.   Knee pain is doing much better.   R foot pain did improve following injection. Does not feel like is 100% and wonders if he needs another injection.   Lumbar spine is improving. upper back and L shoulder pain for past 4 months which is dull and achy.       Past Medical History:  Diagnosis Date   Anemia    Central serous retinopathy    hx/o intraocular injection therapy for 3 years; [name] change after 3 years of therapy, [name] visual c/o   Depression    Former smoker    History of blood transfusion 04/17/2017   "related to anemia"   History of kidney stones    Hypertension    Left varicocele    Port-wine stain of face    Pre-diabetes    Past Surgical History:  Procedure Laterality Date    COLONOSCOPY     never   EXTRACORPOREAL SHOCK WAVE LITHOTRIPSY     PALATE / UVULA BIOPSY / EXCISION  2006   "polyp on uvula cut out"   Social History   Socioeconomic History   Marital status: Divorced    Spouse name: Not on file   Number of children: Not on file   Years of education: Not on file   Highest education level: Not on file  Occupational History   Not on file  Tobacco Use   Smoking status: Former    Packs/day: 1.00    Years: 15.00    Total pack years: 15.00    Types: Cigarettes    Quit date: 12/01/1997    Years since quitting: 25.0   Smokeless tobacco: Never  Vaping Use   Vaping Use: Never used  Substance and Sexual Activity   Alcohol use: Not Currently    Comment: Beer.   Drug use: Yes    Types: Marijuana   Sexual activity: Not Currently  Other Topics Concern   Not on file  Social History Narrative   Divorced 04/2012, 2 children ages daughter 35yo, son 60yo; exercise - moving on the job, walking, works as Electrical engineer   Social Determinants of Radio broadcast assistant Strain: Not on file  Food Insecurity: Not on file  Transportation Needs:  Not on file  Physical Activity: Not on file  Stress: Not on file  Social Connections: Not on file   Allergies  Allergen Reactions   Penicillins Other (See Comments)    Hands & feet peel. "childhood allergy"   Family History  Problem Relation Age of Onset   Heart disease Mother        pacemaker   Heart disease Father 65       died of MI, 79s   Diabetes Father    Stroke Father    Migraines Sister    Mental illness Sister    Cancer Neg Hx    Hyperlipidemia Neg Hx    Hypertension Neg Hx    Colon cancer Neg Hx      Current Outpatient Medications (Cardiovascular):    amLODipine (NORVASC) 5 MG tablet, TAKE 1 TABLET (5 MG TOTAL) BY MOUTH DAILY.   lisinopril-hydrochlorothiazide (ZESTORETIC) 20-12.5 MG tablet, TAKE 1 TABLET BY MOUTH EVERY DAY     Current Outpatient Medications (Other):    DULoxetine  (CYMBALTA) 20 MG capsule, TAKE 1 CAPSULE BY MOUTH EVERY DAY   tiZANidine (ZANAFLEX) 2 MG tablet, TAKE 1 TABLET BY MOUTH EVERYDAY AT BEDTIME   Reviewed prior external information including notes and imaging from  primary care provider As well as notes that were available from care everywhere and other healthcare systems.  Past medical history, social, surgical and family history all reviewed in electronic medical record.  [name] pertanent information unless stated regarding to the chief complaint.   Review of Systems:  [name] headache, visual changes, nausea, vomiting, diarrhea, constipation, dizziness, abdominal pain, skin rash, fevers, chills, night sweats, weight loss, swollen lymph nodes, body aches, joint swelling, chest pain, shortness of breath, mood changes. POSITIVE muscle aches  Objective  Blood pressure 116/64, pulse 77, height '6\' 1"'$  (1.854 m), weight 189 lb (85.7 kg), SpO2 99 %.   General: [name] apparent distress alert and oriented x3 mood and affect normal, dressed appropriately.  HEENT: Pupils equal, extraocular movements intact  Respiratory: Patient's speak in full sentences and does not appear short of breath  Cardiovascular: [name] lower extremity edema, non tender, [name] erythema  Back mild loss of lordosis.  Nontender on exam today.  Patient does have postsurgical changes noted at the umbilicus from the hernia.  He still has some mild swelling noted in the area. Foot exam still has breakdown of the transverse arch but overall [name] significant worsening.  Patient has negative squeeze test noted today.  Limited muscular skeletal ultrasound was performed and interpreted by Hulan Saas, M   Limited ultrasound does not show anything specific at this moment.  [name] significant hypoechoic changes that is concerning.   Impression and Recommendations:     The above documentation has been reviewed and is accurate and complete Jeffery Pulley, DO

## 2022-12-07 ENCOUNTER — Encounter: Payer: Self-pay | Admitting: Family Medicine

## 2022-12-07 ENCOUNTER — Ambulatory Visit: Payer: BC Managed Care – PPO | Admitting: Family Medicine

## 2022-12-07 VITALS — BP 116/64 | HR 77 | Ht 73.0 in | Wt 189.0 lb

## 2022-12-07 DIAGNOSIS — S3422XD Injury of nerve root of sacral spine, subsequent encounter: Secondary | ICD-10-CM

## 2022-12-07 DIAGNOSIS — K4090 Unilateral inguinal hernia, without obstruction or gangrene, not specified as recurrent: Secondary | ICD-10-CM | POA: Diagnosis not present

## 2022-12-07 DIAGNOSIS — M216X1 Other acquired deformities of right foot: Secondary | ICD-10-CM | POA: Diagnosis not present

## 2022-12-07 NOTE — Patient Instructions (Signed)
Send paperwork Looking good otherwise Watch stomach that should resolve See me again in 6 months

## 2022-12-07 NOTE — Assessment & Plan Note (Signed)
Also doing well with her chronic pain associated with this.  No change in any other management at this time.  Continue the medications including the use of Zanaflex.  FMLA paperwork filled out.  Will fill out at a later date total time with patient with this and reviewing previous FMLA paperwork 33 minutes

## 2022-12-07 NOTE — Assessment & Plan Note (Signed)
Patient previously had a neuroma but on ultrasound today I do not see anything significant.  I believe with patient making improvement should do relatively well.  Is on the duloxetine and Zanaflex for any breakthrough pain.  Follow-up with me again in 6 months

## 2022-12-07 NOTE — Assessment & Plan Note (Signed)
Recently did have surgical intervention done and is improving but continues to have some mild inflammation noted.  No sign of any type of current infection but will follow-up with general surgery if needed.

## 2023-01-06 ENCOUNTER — Other Ambulatory Visit: Payer: Self-pay | Admitting: Family Medicine

## 2023-01-16 ENCOUNTER — Other Ambulatory Visit: Payer: Self-pay | Admitting: Internal Medicine

## 2023-01-16 DIAGNOSIS — I10 Essential (primary) hypertension: Secondary | ICD-10-CM

## 2023-03-17 ENCOUNTER — Ambulatory Visit: Payer: BC Managed Care – PPO | Admitting: Internal Medicine

## 2023-03-17 ENCOUNTER — Encounter: Payer: Self-pay | Admitting: Internal Medicine

## 2023-03-17 VITALS — BP 122/82 | HR 86 | Temp 98.5°F | Ht 73.0 in | Wt 182.0 lb

## 2023-03-17 DIAGNOSIS — R2231 Localized swelling, mass and lump, right upper limb: Secondary | ICD-10-CM | POA: Diagnosis not present

## 2023-03-17 NOTE — Progress Notes (Unsigned)
    Subjective:    Patient ID: Jeffery [name], male    DOB: 01/30/1961, 62 y.o.   MRN: [id number]      HPI Jeffery [name] is here for  Chief Complaint  Patient presents with   Mass    INNER RT ELBOW SWOLLEN LYMPH NODE OR CYST... Pt is worried of on going infection    Right antecubital lymph - showed up Tuesday - saw nurse at work - lymph node or cyst .. Possible lymph node - said he may have had an infection.   Some pain when it flared    Hernia surgery in December  - 3 months later    Medications and allergies reviewed with patient and updated if appropriate.  Current Outpatient Medications on File Prior to Visit  Medication Sig Dispense Refill   amLODipine (NORVASC) 5 MG tablet TAKE 1 TABLET (5 MG TOTAL) BY MOUTH DAILY. 90 tablet 0   DULoxetine (CYMBALTA) 20 MG capsule TAKE 1 CAPSULE BY MOUTH EVERY DAY 90 capsule 0   lisinopril-hydrochlorothiazide (ZESTORETIC) 20-12.5 MG tablet TAKE 1 TABLET BY MOUTH EVERY DAY 90 tablet 0   tiZANidine (ZANAFLEX) 2 MG tablet TAKE 1 TABLET BY MOUTH EVERYDAY AT BEDTIME 90 tablet 1   No current facility-administered medications on file prior to visit.    Review of Systems     Objective:   Vitals:   03/17/23 1435  BP: 122/82  Pulse: 86  Temp: 98.5 F (36.9 C)  SpO2: 97%   BP Readings from Last 3 Encounters:  03/17/23 122/82  12/07/22 116/64  08/17/22 (!) 146/74   Wt Readings from Last 3 Encounters:  03/17/23 182 lb (82.6 kg)  12/07/22 189 lb (85.7 kg)  08/17/22 187 lb 8 oz (85 kg)   Body mass index is 24.01 kg/m.    Physical Exam         Assessment & Plan:    See Problem List for Assessment and Plan of chronic medical problems.

## 2023-03-17 NOTE — Patient Instructions (Addendum)
        An ultrasound was ordered and someone will call you to schedule an appointment.     Return if symptoms worsen or fail to improve.

## 2023-04-03 ENCOUNTER — Other Ambulatory Visit: Payer: Self-pay | Admitting: Family Medicine

## 2023-04-19 ENCOUNTER — Other Ambulatory Visit: Payer: Self-pay | Admitting: Internal Medicine

## 2023-04-19 DIAGNOSIS — I10 Essential (primary) hypertension: Secondary | ICD-10-CM

## 2023-05-31 NOTE — Progress Notes (Signed)
[facility] [address] [place] [address] Phone: [phone] Subjective:   Bruce Donath, am serving as a scribe for Dr. Antoine Primas.  I'm seeing this patient by the request  of:  Etta Grandchild, MD  CC: foot pain and all over pain follow up   [id number]  12/07/2022 Also doing well with her chronic pain associated with this. No change in any other management at this time. Continue the medications including the use of Zanaflex. FMLA paperwork filled out. Will fill out at a later date total time with patient with this and reviewing previous FMLA paperwork 33 minutes   Patient previously had a neuroma but on ultrasound today I do not see anything significant.  I believe with patient making improvement should do relatively well.  Is on the duloxetine and Zanaflex for any breakthrough pain.  Follow-up with me again in 6 months     Recently did have surgical intervention done and is improving but continues to have some mild inflammation noted.  No sign of any type of current infection but will follow-up with general surgery if needed.      Update 06/07/2023 ANDREUS MEIGHEN is a 62 y.o. male coming in with complaint of R hip pain, lower back and R foot pain. Patient states that his R foot pain, R wrist and L shoulder has been painful.  Patient continues to have discomfort on a regular basis.  States that it seems to be job-related.  Patient has been very frustrated with the people that he is working with, feels like he does not have a good support system.  Feels that his job is killing him slowly.  Patient states that sometimes he is frustrated and would like it all to end.  Denies though any suicidal ideations.  States what keeps him going is more with his relationship with God.     Past Medical History:  Diagnosis Date   Anemia    Central serous retinopathy    hx/o intraocular injection therapy for 3 years; no change after 3 years of therapy,  no visual c/o   Depression    Former smoker    History of blood transfusion 04/17/2017   "related to anemia"   History of kidney stones    Hypertension    Left varicocele    Port-wine stain of face    Pre-diabetes    Past Surgical History:  Procedure Laterality Date   COLONOSCOPY     never   EXTRACORPOREAL SHOCK WAVE LITHOTRIPSY     PALATE / UVULA BIOPSY / EXCISION  2006   "polyp on uvula cut out"   Social History   Socioeconomic History   Marital status: Divorced    Spouse name: Not on file   Number of children: Not on file   Years of education: Not on file   Highest education level: Not on file  Occupational History   Not on file  Tobacco Use   Smoking status: Former    Current packs/day: 0.00    Average packs/day: 1 pack/day for 15.0 years (15.0 ttl pk-yrs)    Types: Cigarettes    Start date: 12/02/1982    Quit date: 12/01/1997    Years since quitting: 25.5   Smokeless tobacco: Never  Vaping Use   Vaping status: Never Used  Substance and Sexual Activity   Alcohol use: Not Currently    Comment: Beer.   Drug use: Yes    Types: Marijuana  Sexual activity: Not Currently  Other Topics Concern   Not on file  Social History Narrative   Divorced 04/2012, 2 children ages daughter 72yo, son 62yo; exercise - moving on the job, walking, works as Retail banker   Social Determinants of Corporate investment banker Strain: Not on file  Food Insecurity: Not on file  Transportation Needs: Not on file  Physical Activity: Not on file  Stress: Not on file  Social Connections: Unknown (02/04/2022)   Received from [employer]   Social Network    Social Network: Not on file   Allergies  Allergen Reactions   Penicillins Other (See Comments)    Hands & feet peel. "childhood allergy"   Family History  Problem Relation Age of Onset   Heart disease Mother        pacemaker   Heart disease Father 59       died of MI, 46s   Diabetes Father    Stroke Father    Migraines  Sister    Mental illness Sister    Cancer Neg Hx    Hyperlipidemia Neg Hx    Hypertension Neg Hx    Colon cancer Neg Hx      Current Outpatient Medications (Cardiovascular):    amLODipine (NORVASC) 5 MG tablet, TAKE 1 TABLET (5 MG TOTAL) BY MOUTH DAILY.   lisinopril-hydrochlorothiazide (ZESTORETIC) 20-12.5 MG tablet, TAKE 1 TABLET BY MOUTH EVERY DAY     Current Outpatient Medications (Other):    DULoxetine (CYMBALTA) 20 MG capsule, TAKE 1 CAPSULE BY MOUTH EVERY DAY   tiZANidine (ZANAFLEX) 2 MG tablet, TAKE 1 TABLET BY MOUTH EVERYDAY AT BEDTIME   Reviewed prior external information including notes and imaging from  primary care provider As well as notes that were available from care everywhere and other healthcare systems.  Past medical history, social, surgical and family history all reviewed in electronic medical record.  No pertanent information unless stated regarding to the chief complaint.   Review of Systems:  No headache, visual changes, nausea, vomiting, diarrhea, constipation, dizziness, abdominal pain, skin rash, fevers, chills, night sweats, weight loss, swollen lymph nodes, joint swelling, chest pain, shortness of breath,. POSITIVE muscle aches, body aches, mood changes   Objective  Height 6\' 1"  (1.854 m), weight 180 lb (81.6 kg).   General: Patient is highly emotional, tearful.  Patient does seem very frustrated patient is swearing quite significant amount, patient though is alert and oriented x 3 HEENT: Dilated pupils Respiratory: Patient's speak in full sentences and does not appear short of breath  Cardiovascular: No lower extremity edema, non tender, no erythema  Patient is able to move the extremities relatively normal.  Difficult to do full exam secondary to patient's being very emotional and tangential thought process.    Impression and Recommendations:    The above documentation has been reviewed and is accurate and complete Judi Saa, DO

## 2023-06-07 ENCOUNTER — Ambulatory Visit (INDEPENDENT_AMBULATORY_CARE_PROVIDER_SITE_OTHER): Payer: BC Managed Care – PPO | Admitting: Family Medicine

## 2023-06-07 ENCOUNTER — Encounter: Payer: Self-pay | Admitting: Family Medicine

## 2023-06-07 VITALS — Ht 73.0 in | Wt 180.0 lb

## 2023-06-07 DIAGNOSIS — F329 Major depressive disorder, single episode, unspecified: Secondary | ICD-10-CM

## 2023-06-07 DIAGNOSIS — S3422XD Injury of nerve root of sacral spine, subsequent encounter: Secondary | ICD-10-CM

## 2023-06-07 NOTE — Assessment & Plan Note (Addendum)
Patient was significantly emotional at the moment. Discussed with patient at great length.  Questionable substance abuse. patient did make suggestions of potential self-harm.  Patient noticed by the end of it was seen that he was more frustrated than anything else.  We discussed with him at this point I do feel that he should go to the emergency room for further evaluation.  We discussed that we could call EMS.  Patient declined this and leave.  Patient denies any homicidal ideation.  Is highly frustrated with his work environment at the moment.  We discussed with him about different options for this as well.  Patient though does not feel that he can take any more time off of work secondary to potentially losing his job and cost of living.  Social determinants of health including patient does not have a great support system.  We did discuss different family members including his sister he states who lives in [place] and is a physician.  We discussed other family members including his children who also he feels has a strained relationship at the moment.  We did discuss that we are here for him if he needs anything else.  Encouraged patient again if any suicidal or homicidal ideation to seek medical attention immediately.  We did discuss going up also on his Cymbalta which patient declined and has not been using it regularly he states but will start to.  Total time with patient today 32 minutes

## 2023-06-07 NOTE — Assessment & Plan Note (Signed)
Still gets a flareup from time to time.  Would fill out FMLA paperwork when patient is having the flares.  Discussed with patient about other treatment options and encouraged him to follow-up with surgeon if necessary, patient would like to avoid medications.  Once again patient was highly emotional so was difficult to assess further.

## 2023-06-16 ENCOUNTER — Other Ambulatory Visit: Payer: Self-pay | Admitting: Internal Medicine

## 2023-06-16 ENCOUNTER — Other Ambulatory Visit: Payer: Self-pay | Admitting: Family Medicine

## 2023-06-16 DIAGNOSIS — I10 Essential (primary) hypertension: Secondary | ICD-10-CM

## 2023-06-21 ENCOUNTER — Other Ambulatory Visit: Payer: Self-pay | Admitting: Internal Medicine

## 2023-06-21 DIAGNOSIS — I10 Essential (primary) hypertension: Secondary | ICD-10-CM

## 2023-06-22 ENCOUNTER — Other Ambulatory Visit: Payer: Self-pay | Admitting: Internal Medicine

## 2023-06-22 DIAGNOSIS — I10 Essential (primary) hypertension: Secondary | ICD-10-CM

## 2023-07-26 ENCOUNTER — Encounter: Payer: Self-pay | Admitting: Internal Medicine

## 2023-07-26 ENCOUNTER — Ambulatory Visit: Payer: BC Managed Care – PPO | Admitting: Internal Medicine

## 2023-07-26 VITALS — BP 138/86 | HR 80 | Temp 98.0°F | Resp 16 | Ht 73.0 in | Wt 171.2 lb

## 2023-07-26 DIAGNOSIS — I10 Essential (primary) hypertension: Secondary | ICD-10-CM

## 2023-07-26 DIAGNOSIS — I491 Atrial premature depolarization: Secondary | ICD-10-CM

## 2023-07-26 DIAGNOSIS — R001 Bradycardia, unspecified: Secondary | ICD-10-CM | POA: Diagnosis not present

## 2023-07-26 DIAGNOSIS — R7303 Prediabetes: Secondary | ICD-10-CM

## 2023-07-26 DIAGNOSIS — Z0001 Encounter for general adult medical examination with abnormal findings: Secondary | ICD-10-CM

## 2023-07-26 LAB — CBC WITH DIFFERENTIAL/PLATELET
Basophils Absolute: 0 10*3/uL (ref 0.0–0.1)
Basophils Relative: 0.6 % (ref 0.0–3.0)
Eosinophils Absolute: 0.1 10*3/uL (ref 0.0–0.7)
Eosinophils Relative: 1.6 % (ref 0.0–5.0)
HCT: 45.5 % (ref 39.0–52.0)
Hemoglobin: 15.3 g/dL (ref 13.0–17.0)
Lymphocytes Relative: 20 % (ref 12.0–46.0)
Lymphs Abs: 1.1 10*3/uL (ref 0.7–4.0)
MCHC: 33.7 g/dL (ref 30.0–36.0)
MCV: 93.4 fL (ref 78.0–100.0)
Monocytes Absolute: 0.7 10*3/uL (ref 0.1–1.0)
Monocytes Relative: 12.9 % — ABNORMAL HIGH (ref 3.0–12.0)
Neutro Abs: 3.4 10*3/uL (ref 1.4–7.7)
Neutrophils Relative %: 64.9 % (ref 43.0–77.0)
Platelets: 238 10*3/uL (ref 150.0–400.0)
RBC: 4.87 Mil/uL (ref 4.22–5.81)
RDW: 12.3 % (ref 11.5–15.5)
WBC: 5.3 10*3/uL (ref 4.0–10.5)

## 2023-07-26 LAB — BASIC METABOLIC PANEL
BUN: 14 mg/dL (ref 6–23)
CO2: 30 meq/L (ref 19–32)
Calcium: 10.4 mg/dL (ref 8.4–10.5)
Chloride: 101 meq/L (ref 96–112)
Creatinine, Ser: 1.07 mg/dL (ref 0.40–1.50)
GFR: 75.42 mL/min (ref >60.00)
Glucose, Bld: 112 mg/dL — ABNORMAL HIGH (ref 70–99)
Potassium: 4 meq/L (ref 3.5–5.1)
Sodium: 139 meq/L (ref 135–145)

## 2023-07-26 LAB — HEPATIC FUNCTION PANEL
ALT: 16 U/L (ref 0–53)
AST: 16 U/L (ref 0–37)
Albumin: 4.7 g/dL (ref 3.5–5.2)
Alkaline Phosphatase: 90 U/L (ref 39–117)
Bilirubin, Direct: 0.2 mg/dL (ref 0.0–0.3)
Total Bilirubin: 1 mg/dL (ref 0.2–1.2)
Total Protein: 7.6 g/dL (ref 6.0–8.3)

## 2023-07-26 LAB — TSH: TSH: 2.04 u[IU]/mL (ref 0.35–5.50)

## 2023-07-26 LAB — HEMOGLOBIN A1C: Hgb A1c MFr Bld: 6.3 % (ref 4.6–6.5)

## 2023-07-26 MED ORDER — LISINOPRIL-HYDROCHLOROTHIAZIDE 20-12.5 MG PO TABS: 1.0000 | ORAL_TABLET | Freq: Every day | ORAL | 0 refills | Status: DC

## 2023-07-26 NOTE — Patient Instructions (Signed)
Premature Ventricular Contraction  A premature ventricular contraction (PVC) is a type of irregular heartbeat (arrhythmia). The heart has four chambers, including the upper chambers (atria) and lower chambers (ventricles). Normally, an electrical signal starts in a group of cells called the sinoatrial node (SA node) and travels through the atria, causing them to pump blood into the ventricles. During a PVC, the heartbeat starts in one of the lower ventricles. This may cause the heartbeat to be shorter and less effective. In most cases, PVCs come and go and do not require treatment. What are the causes? Common causes of the condition include: Heart attack or coronary artery disease (CAD). Heart valve problems. Heart surgery. Infection of the heart (myocarditis). Inflammation of the heart. In many cases, the cause of this condition is not known. What increases the risk? The following factors may make you more likely to develop this condition: Age, especially being over age 54. Being male. An imbalance of salts and minerals in the body (electrolytes). Low blood oxygen levels or high carbon dioxide levels. Certain medicines, including over-the-counter and prescribed medicines. High blood pressure. Obesity. Episodes may be triggered by: Vigorous exercise. Tobacco, alcohol, or caffeine use. Illegal drug use. Emotional stress. Poor or irregular sleep. What are the signs or symptoms? The main symptoms of this condition are fast or irregular heartbeats (palpitations) or the feeling of a pause in the heartbeat. Other symptoms include: Shortness of breath. Difficulty exercising. Chest pain. Feeling tired. Dizziness. In some cases, there are no symptoms. How is this diagnosed? This condition may be diagnosed based on: Your medical history or symptoms. A physical exam. Your health care provider may listen to your heart. Tests, such as: Blood tests. An ECG (electrocardiogram) to monitor the  electrical activity of your heart. An ambulatory cardiac monitor that records your heartbeats for 24 hours or more. Stress tests to see how exercise affects your heart rhythm and blood supply. An echocardiogram, which creates an image of your heart. An electrophysiology study (EPS) to check for electrical problems in your heart. How is this treated? Treatment for this condition depends on any underlying conditions, the type of PVC, how many PVCs you have had, and if the symptoms are affecting your daily life. Possible treatments include: Avoiding things that cause PVCs (triggers). These include caffeine, tobacco, and alcohol. Taking medicines if symptoms are severe or if the arrhythmias happen a lot. Getting treatment for underlying conditions that cause PVCs. Having an implantable cardioverter defibrillator (ICD) placed in the chest to monitor the heartbeat. The monitor sends a shock to the heart if it senses an arrhythmia and brings the heartbeat back to normal. Having a catheter ablation procedure to destroy the part of the heart tissue that sends abnormal signals. In many cases, no treatment is required. Follow these instructions at home: Lifestyle  Do not use any products that contain nicotine or tobacco. These products include cigarettes, chewing tobacco, and vaping devices, such as e-cigarettes. If you need help quitting, ask your health care provider. Do not use illegal drugs. Exercise regularly. Ask your health care provider what type of exercise is safe for you. Try to get at least 7-9 hours of sleep each night. Find healthy ways to manage stress. Avoid stressful situations when possible. Alcohol use Do not drink alcohol if: Your health care provider tells you not to drink. You are pregnant, may be pregnant, or are planning to become pregnant. Alcohol triggers your episodes. If you drink alcohol: Limit how much you have to: 0-1  drink a day for women. 0-2 drinks a day for  men. Know how much alcohol is in your drink. In the [place] one drink equals one 12 oz bottle of beer (355 mL), one 5 oz glass of wine (148 mL), or one 1 oz glass of hard liquor (44 mL). General instructions Take over-the-counter and prescription medicines only as told by your health care provider. If caffeine triggers episodes of PVC, do not eat, drink, or use anything with caffeine in it. Contact a health care provider if: You feel palpitations often. You have nausea and vomiting. Get help right away if: You have chest pain. You have trouble breathing. You start sweating for no reason. You become light-headed or you faint. These symptoms may be an emergency. Get help right away. Call 911. Do not wait to see if the symptoms will go away. Do not drive yourself to the hospital. This information is not intended to replace advice given to you by your health care provider. Make sure you discuss any questions you have with your health care provider. Document Revised: 02/18/2022 Document Reviewed: 02/18/2022 Elsevier Patient Education  2024 [employer].

## 2023-07-26 NOTE — Progress Notes (Signed)
Subjective:  Patient ID: Jeffery [name], male    DOB: July 29, 1961  Age: 62 y.o. MRN: [id number]  CC: Hypertension   HPI Jeffery [name] presents for f/up ----  Discussed the use of AI scribe software for clinical note transcription with the patient, who gave verbal consent to proceed.  History of Present Illness   The patient, with a history of a cardiac arrhythmia postprandial fatigue. He reports a significant increase in tiredness, particularly after meals, which often necessitates a brief period of rest. This symptom is not present every day, but is a frequent occurrence. He denies any associated chest pain, shortness of breath, dizziness, or lightheadedness. He also denies palpitations or any irregular heartbeat sensation.  In addition to the postprandial fatigue  He has a preference against vaccinations, including flu and tetanus, citing a strong immune system.       Outpatient Medications Prior to Visit  Medication Sig Dispense Refill   DULoxetine (CYMBALTA) 20 MG capsule TAKE 1 CAPSULE BY MOUTH EVERY DAY 90 capsule 0   tiZANidine (ZANAFLEX) 2 MG tablet TAKE 1 TABLET BY MOUTH EVERYDAY AT BEDTIME 90 tablet 1   amLODipine (NORVASC) 5 MG tablet TAKE 1 TABLET (5 MG TOTAL) BY MOUTH DAILY. 90 tablet 0   lisinopril-hydrochlorothiazide (ZESTORETIC) 20-12.5 MG tablet TAKE 1 TABLET BY MOUTH EVERY DAY 30 tablet 0   No facility-administered medications prior to visit.    ROS Review of Systems  Constitutional:  Positive for fatigue. Negative for appetite change and unexpected weight change.  HENT: Negative.    Eyes: Negative.   Respiratory:  Negative for cough, chest tightness, shortness of breath and wheezing.   Cardiovascular:  Negative for chest pain, palpitations and leg swelling.  Gastrointestinal:  Negative for abdominal pain, constipation, diarrhea, nausea and vomiting.  Endocrine: Negative.   Genitourinary: Negative.  Negative for difficulty urinating.   Musculoskeletal:  Negative for arthralgias.  Skin: Negative.   Neurological: Negative.  Negative for dizziness and weakness.  Hematological:  Negative for adenopathy. Does not bruise/bleed easily.  Psychiatric/Behavioral:  Positive for confusion and decreased concentration. Negative for dysphoric mood.     Objective:  BP 138/86 (BP Location: Left Arm, Patient Position: Sitting, Cuff Size: Normal) Comment: BP (R) 134/82 (L) 138/76  Pulse 80   Temp 98 F (36.7 C) (Oral)   Resp 16   Ht 6\' 1"  (1.854 m)   Wt 171 lb 3.2 oz (77.7 kg)   SpO2 98%   BMI 22.59 kg/m   BP Readings from Last 3 Encounters:  07/26/23 138/86  03/17/23 122/82  12/07/22 116/64    Wt Readings from Last 3 Encounters:  07/26/23 171 lb 3.2 oz (77.7 kg)  06/07/23 180 lb (81.6 kg)  03/17/23 182 lb (82.6 kg)    Physical Exam Vitals reviewed.  Constitutional:      Appearance: Normal appearance.  HENT:     Nose: Nose normal.     Mouth/Throat:     Mouth: Mucous membranes are moist.  Eyes:     General: No scleral icterus.    Conjunctiva/sclera: Conjunctivae normal.  Cardiovascular:     Rate and Rhythm: Normal rate and regular rhythm.     Heart sounds: Normal heart sounds, S1 normal and S2 normal. No murmur heard.    No gallop.     Comments: EKG- SR with frequent PVC's/bigeminy, 80 bpm Low voltage No LVH, Q waves, or ST/T waves  Pulmonary:     Effort: Pulmonary effort is normal.  Breath sounds: No stridor. No wheezing, rhonchi or rales.  Abdominal:     General: Abdomen is flat.     Palpations: There is no mass.     Tenderness: There is no abdominal tenderness. There is no guarding.     Hernia: No hernia is present.  Musculoskeletal:     Right lower leg: No edema.     Left lower leg: No edema.  Skin:    General: Skin is warm and dry.  Neurological:     General: No focal deficit present.     Mental Status: He is alert. Mental status is at baseline.  Psychiatric:        Mood and Affect: Mood  normal.        Behavior: Behavior normal.     Lab Results  Component Value Date   WBC 5.3 07/26/2023   HGB 15.3 07/26/2023   HCT 45.5 07/26/2023   PLT 238.0 07/26/2023   GLUCOSE 112 (H) 07/26/2023   CHOL 191 07/15/2021   TRIG 49 07/15/2021   HDL 70 07/15/2021   LDLCALC 112 (H) 07/15/2021   ALT 16 07/26/2023   AST 16 07/26/2023   NA 139 07/26/2023   K 4.0 07/26/2023   CL 101 07/26/2023   CREATININE 1.07 07/26/2023   BUN 14 07/26/2023   CO2 30 07/26/2023   TSH 2.04 07/26/2023   PSA 1.25 08/17/2022   INR 1.01 06/26/2017   HGBA1C 6.3 07/26/2023    DG INJECT DIAG/THERA/INC NEEDLE/CATH/PLC EPI/LUMB/SAC W/IMG  Result Date: 09/16/2021 CLINICAL DATA:  Spondylosis without myelopathy. Good response from previous injections but with mild residual left buttock and leg discomfort. FLUOROSCOPY TIME:  0 minutes 11 seconds. 7.57 micro gray meter squared PROCEDURE: The procedure, risks, benefits, and alternatives were explained to the patient. Questions regarding the procedure were encouraged and answered. The patient understands and consents to the procedure. LUMBAR EPIDURAL INJECTION: An interlaminar approach was performed on the left at L4-5. The overlying skin was cleansed and anesthetized. A 20 gauge epidural needle was advanced using loss-of-resistance technique. DIAGNOSTIC EPIDURAL INJECTION: Injection of Isovue-M 200 shows a good epidural pattern with spread above and below the level of needle placement, primarily on the left. No vascular opacification is seen. THERAPEUTIC EPIDURAL INJECTION: Eighty mg of Depo-Medrol mixed with 2.5 cc 1% lidocaine were instilled. The procedure was well-tolerated, and the patient was discharged thirty minutes following the injection in good condition. COMPLICATIONS: None IMPRESSION: Technically successful epidural injection on the left at L4-5 # 3 Electronically Signed   By: Paulina Fusi M.D.   On: 09/16/2021 14:29    Assessment & Plan:   Essential  hypertension, benign - His BP is well controlled. Lytes/renal function are normal. -     TSH; Future -     Urinalysis, Routine w reflex microscopic; Future -     Hepatic function panel; Future -     CBC with Differential/Platelet; Future -     Basic metabolic panel; Future -     Lisinopril-hydroCHLOROthiazide; Take 1 tablet by mouth daily.  Dispense: 90 tablet; Refill: 0 -     amLODIPine Besylate; Take 1 tablet (5 mg total) by mouth daily.  Dispense: 90 tablet; Refill: 0  Premature atrial contractions -     TSH; Future -     Ambulatory referral to Cardiology -     amLODIPine Besylate; Take 1 tablet (5 mg total) by mouth daily.  Dispense: 90 tablet; Refill: 0  Prediabetes -     Hepatic  function panel; Future -     Hemoglobin A1c; Future -     Basic metabolic panel; Future  Bradycardia -     EKG 12-Lead     Follow-up: Return in about 3 months (around 10/26/2023).  Sanda Linger, MD

## 2023-07-28 MED ORDER — AMLODIPINE BESYLATE 5 MG PO TABS
5.0000 mg | ORAL_TABLET | Freq: Every day | ORAL | 0 refills | Status: DC
Start: 2023-07-28 — End: 2023-09-14

## 2023-09-14 ENCOUNTER — Ambulatory Visit: Payer: BC Managed Care – PPO | Attending: Cardiology

## 2023-09-14 ENCOUNTER — Ambulatory Visit: Payer: BC Managed Care – PPO | Attending: Cardiology | Admitting: Cardiology

## 2023-09-14 ENCOUNTER — Telehealth: Payer: Self-pay | Admitting: *Deleted

## 2023-09-14 ENCOUNTER — Encounter: Payer: Self-pay | Admitting: Cardiology

## 2023-09-14 VITALS — BP 142/74 | HR 88 | Ht 75.0 in | Wt 182.4 lb

## 2023-09-14 DIAGNOSIS — I491 Atrial premature depolarization: Secondary | ICD-10-CM

## 2023-09-14 DIAGNOSIS — I493 Ventricular premature depolarization: Secondary | ICD-10-CM | POA: Diagnosis not present

## 2023-09-14 DIAGNOSIS — I10 Essential (primary) hypertension: Secondary | ICD-10-CM

## 2023-09-14 NOTE — Patient Instructions (Signed)
Medication Instructions:   Your physician recommends that you continue on your current medications as directed. Please refer to the Current Medication list given to you today.  *If you need a refill on your cardiac medications before your next appointment, please call your pharmacy*    Testing/Procedures:  Your physician has requested that you have an echocardiogram. Echocardiography is a painless test that uses sound waves to create images of your heart. It provides your doctor with information about the size and shape of your heart and how well your heart's chambers and valves are working. This procedure takes approximately one hour. There are no restrictions for this procedure. Please do NOT wear cologne, perfume, aftershave, or lotions (deodorant is allowed). Please arrive 15 minutes prior to your appointment time.  Please note: We ask at that you not bring children with you during ultrasound (echo/ vascular) testing. Due to room size and safety concerns, children are not allowed in the ultrasound rooms during exams. Our front office staff cannot provide observation of children in our lobby area while testing is being conducted. An adult accompanying a patient to their appointment will only be allowed in the ultrasound room at the discretion of the ultrasound technician under special circumstances. We apologize for any inconvenience.   ZIO XT- Long Term Monitor Instructions  Your physician has requested you wear a ZIO patch monitor for 14 days.  This is a single patch monitor. [employer] supplies one patch monitor per enrollment. Additional stickers are not available. Please do not apply patch if you will be having a Nuclear Stress Test,  Echocardiogram, Cardiac CT, MRI, or Chest Xray during the period you would be wearing the  monitor. The patch cannot be worn during these tests. You cannot remove and re-apply the  ZIO XT patch monitor.  Your ZIO patch monitor will be mailed 3 day USPS to  your address on file. It may take 3-5 days  to receive your monitor after you have been enrolled.  Once you have received your monitor, please review the enclosed instructions. Your monitor  has already been registered assigning a specific monitor serial # to you.  Billing and Patient Assistance Program Information  We have supplied [employer] with any of your insurance information on file for billing purposes. [employer] offers a sliding scale Patient Assistance Program for patients that do not have  insurance, or whose insurance does not completely cover the cost of the ZIO monitor.  You must apply for the Patient Assistance Program to qualify for this discounted rate.  To apply, please call [employer] at [phone], select option 4, select option 2, ask to apply for  Patient Assistance Program. Meredeth Ide will ask your household income, and how many people  are in your household. They will quote your out-of-pocket cost based on that information.  [employer] will also be able to set up a 7-month, interest-free payment plan if needed.  Applying the monitor   Shave hair from upper left chest.  Hold abrader disc by orange tab. Rub abrader in 40 strokes over the upper left chest as  indicated in your monitor instructions.  Clean area with 4 enclosed alcohol pads. Let dry.  Apply patch as indicated in monitor instructions. Patch will be placed under collarbone on left  side of chest with arrow pointing upward.  Rub patch adhesive wings for 2 minutes. Remove white label marked "1". Remove the white  label marked "2". Rub patch adhesive wings for 2 additional minutes.  While looking in a  mirror, press and release button in center of patch. A small green light will  flash 3-4 times. This will be your only indicator that the monitor has been turned on.  Do not shower for the first 24 hours. You may shower after the first 24 hours.  Press the button if you feel a symptom. You will hear a small click. Record  Date, Time and  Symptom in the Patient Logbook.  When you are ready to remove the patch, follow instructions on the last 2 pages of Patient  Logbook. Stick patch monitor onto the last page of Patient Logbook.  Place Patient Logbook in the blue and white box. Use locking tab on box and tape box closed  securely. The blue and white box has prepaid postage on it. Please place it in the mailbox as  soon as possible. Your physician should have your test results approximately 7 days after the  monitor has been mailed back to [facility].  Call [facility] Customer Care at [phone] if you have questions regarding  your ZIO XT patch monitor. Call them immediately if you see an orange light blinking on your  monitor.  If your monitor falls off in less than 4 days, contact our Monitor department at [phone].  If your monitor becomes loose or falls off after 4 days call [employer] at [phone] for  suggestions on securing your monitor    Follow-Up:  3 MONTHS WITH AN EXTENDER IN THE OFFICE

## 2023-09-14 NOTE — Progress Notes (Signed)
  Cardiology Office Note:  .   Date:  09/14/2023  ID:  KEVRON NACCARATO, DOB 1961/01/27, MRN [id number] PCP: Etta Grandchild, MD  [facility] Providers Cardiologist:  Truett Mainland, MD PCP: Etta Grandchild, MD  Chief Complaint  Patient presents with   Palpitations      History of Present Illness: .    Jeffery [name] is a 62 y.o. 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  He complains of feeling of palpitations, with no associated chest pain or shortness of breath.  Reviewed EKGs performed with PCP as well as EKG performed today.  He drinks alcohol occasionally, drinks a cup of coffee regularly.  Has hypertension.  Amlodipine was discontinued due to dizziness.  Dizziness has improved since  Vitals:   09/14/23 0909 09/14/23 0917  BP: (!) 146/70 (!) 142/74  Pulse: 88   SpO2: 97%      ROS:  Review of Systems  Cardiovascular:  Positive for palpitations. Negative for chest pain, dyspnea on exertion, leg swelling and syncope.     Studies Reviewed: [name]        EKG 09/14/2023: Sinus rhythm with frequent Premature ventricular complexes Low voltage QRS When compared with ECG of 17-Apr-2017 14:40,  PVCs are new    Independently interpreted 07/2023: Hb 15.3 Cr 1.07  07/2021: Chol 191, TG 49, HDL 70, LDL 112   Physical Exam:   Physical Exam Vitals and nursing note reviewed.  Constitutional:      General: He is not in acute distress. Neck:     Vascular: No JVD.  Cardiovascular:     Rate and Rhythm: Normal rate and regular rhythm.     Heart sounds: Normal heart sounds. No murmur heard. Pulmonary:     Effort: Pulmonary effort is normal.     Breath sounds: Normal breath sounds. No wheezing or rales.  Musculoskeletal:     Right lower leg: No edema.     Left lower leg: No edema.      VISIT DIAGNOSES:   ICD-10-CM   1. Premature atrial  contractions  I49.1 EKG 12-Lead    LONG TERM MONITOR (3-14 DAYS)    2. Primary hypertension  I10 ECHOCARDIOGRAM COMPLETE    3. PVC (premature ventricular contraction)  I49.3 LONG TERM MONITOR (3-14 DAYS)       ASSESSMENT AND PLAN: .    [name] is a 62 y.o. male with hypertension, PAC, PVC  Palpitations: Most likely etiology is PAC and PVC. In addition, will obtain 2-week Zio patch to rule out any other arrhythmia.  After addition of metoprolol, but patient well-controlled off for now.  Hypertension: Blood pressure elevated today.  Patient did not sleep well last night.  This could be partly contributed to his elevated blood pressure readings today.  No change made today.      F/u in 3 months  Signed, Elder Negus, MD

## 2023-09-14 NOTE — Telephone Encounter (Signed)
-----   Message from Flavia Shipper sent at 09/14/2023 10:19 AM EST ----- Regarding: RE: 14 DAY ZIO PER DR. PATWARDHAN done ----- Message ----- From: Loa Socks, LPN Sent: 16/07/9603   9:33 AM EST To: Ernst Bowler; Katrina Claria Dice Subject: 14 DAY ZIO PER DR. PATWARDHAN                  14 day zio for pacs and pvcs  Please enroll and let me know when you do?   Thanks [employer]

## 2023-09-14 NOTE — Progress Notes (Unsigned)
Enrolled pateint for a 14 day Zio XT monitor to be mailed to patients home

## 2023-09-18 DIAGNOSIS — I491 Atrial premature depolarization: Secondary | ICD-10-CM | POA: Diagnosis not present

## 2023-09-18 DIAGNOSIS — I493 Ventricular premature depolarization: Secondary | ICD-10-CM | POA: Diagnosis not present

## 2023-10-20 ENCOUNTER — Other Ambulatory Visit: Payer: Self-pay | Admitting: Internal Medicine

## 2023-10-20 DIAGNOSIS — I10 Essential (primary) hypertension: Secondary | ICD-10-CM

## 2023-10-24 ENCOUNTER — Ambulatory Visit (HOSPITAL_COMMUNITY): Payer: BC Managed Care – PPO | Attending: Cardiology

## 2023-10-24 DIAGNOSIS — I10 Essential (primary) hypertension: Secondary | ICD-10-CM | POA: Insufficient documentation

## 2023-10-24 LAB — ECHOCARDIOGRAM COMPLETE
Area-P 1/2: 2.13 cm2
S' Lateral: 3.9 cm

## 2023-10-25 NOTE — Progress Notes (Signed)
Reviewed echocardiogram.  Heart function is mildly reduced.  He continues to have frequent PVCs noted during the echocardiogram.  I think he will benefit from addition of metoprolol.  He may also need stress testing to rule out ischemic etiology.  I be happy to discuss all of this and a follow-up visit.  Please arrange at next available time, patient okay with that.  Thanks MJP

## 2023-10-26 ENCOUNTER — Telehealth: Payer: Self-pay | Admitting: Cardiology

## 2023-10-26 NOTE — Telephone Encounter (Signed)
-----   Message from [facility] sent at 10/25/2023  4:27 PM EST ----- Reviewed echocardiogram.  Heart function is mildly reduced.  He continues to have frequent PVCs noted during the echocardiogram.  I think he will benefit from addition of metoprolol.  He may also need stress testing to rule out ischemic etiology.  I be happy to discuss all of this and a follow-up visit.  Please arrange at next available time, patient okay with that.  Thanks MJP

## 2023-10-26 NOTE — Telephone Encounter (Signed)
Returning call to a nurse for results

## 2023-10-26 NOTE — Telephone Encounter (Signed)
The patient has been notified of the result and verbalized understanding.  All questions (if any) were answered.  Pt will come into the office tomorrow 1/24 at 1140 to see Dr. Rosemary Holms, to further discuss results and plan.  Appt scheduled for tomorrow 1/24 at 1140 with Dr. Rosemary Holms.  He is aware to arrive 15 mins prior to this appt.   Pt verbalized understanding and agrees with this plan.

## 2023-10-27 ENCOUNTER — Encounter (HOSPITAL_COMMUNITY): Payer: Self-pay

## 2023-10-27 ENCOUNTER — Encounter: Payer: Self-pay | Admitting: Cardiology

## 2023-10-27 ENCOUNTER — Encounter: Payer: Self-pay | Admitting: *Deleted

## 2023-10-27 ENCOUNTER — Ambulatory Visit: Payer: BC Managed Care – PPO | Attending: Cardiology | Admitting: Cardiology

## 2023-10-27 VITALS — BP 120/60 | HR 88 | Resp 16 | Ht 75.0 in | Wt 192.0 lb

## 2023-10-27 DIAGNOSIS — I10 Essential (primary) hypertension: Secondary | ICD-10-CM | POA: Diagnosis not present

## 2023-10-27 DIAGNOSIS — I5020 Unspecified systolic (congestive) heart failure: Secondary | ICD-10-CM | POA: Diagnosis not present

## 2023-10-27 DIAGNOSIS — E782 Mixed hyperlipidemia: Secondary | ICD-10-CM | POA: Insufficient documentation

## 2023-10-27 DIAGNOSIS — I493 Ventricular premature depolarization: Secondary | ICD-10-CM

## 2023-10-27 DIAGNOSIS — I4729 Other ventricular tachycardia: Secondary | ICD-10-CM | POA: Insufficient documentation

## 2023-10-27 DIAGNOSIS — I4719 Other supraventricular tachycardia: Secondary | ICD-10-CM | POA: Insufficient documentation

## 2023-10-27 MED ORDER — ENTRESTO 24-26 MG PO TABS: 1.0000 | ORAL_TABLET | Freq: Two times a day (BID) | ORAL | 2 refills | Status: DC

## 2023-10-27 MED ORDER — METOPROLOL SUCCINATE ER 25 MG PO TB24: 25.0000 mg | ORAL_TABLET | Freq: Every day | ORAL | 1 refills | Status: DC

## 2023-10-27 NOTE — Progress Notes (Signed)
Cardiology Office Note:  .   Date:  10/27/2023  ID:  KENYON EICHELBERGER, DOB 10-04-1960, MRN [id number] PCP: Etta Grandchild, MD  [facility] Providers Cardiologist:  Truett Mainland, MD PCP: Etta Grandchild, MD  Chief Complaint  Patient presents with   Premature atrial contractions   Follow-up      History of Present Illness: .    SQUIRE WITHEY is a 63 y.o. male with hypertension, frequent symptomatic PVCs.  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  He has not done any significant physical activities since biking in September that caused exertional dyspnea.  He attributes a lot of his symptoms to work-related stress, but he has been out of work since September.  Discuss results of cardiac testing with the patient, details below.  Vitals:   10/27/23 1142  BP: 120/60  Pulse: 88  Resp: 16  SpO2: 98%     ROS:  Review of Systems  Cardiovascular:  Positive for dyspnea on exertion. Negative for chest pain, leg swelling, palpitations and syncope.     Studies Reviewed: [name]       EKG 10/27/2023: Sinus rhythm with frequent Premature ventricular complexes Low voltage QRS When compared with ECG of 14-Sep-2023 09:11, No significant change since  Independently interpreted 07/2023: HbA1C 6.3% Hb 15.3 Cr 1.07 TSH 2.0  07/2021: Chol 191, TG 49, HDL 70, LDL 112  Independently interpreted Echocardiogram 10/24/2023: Mild asymmetric hypertrophy of basal septal segment.  Mild open hypokinesis.  LVEF 40-45%.  Indeterminate diastolic function. Normal RV systolic function. No significant valvular abnormality. Frequent PVCs noted.  Zio patch monitor 14 days 09/18/2023 - 10/02/2023: Dominant rhythm: Sinus. HR 46-129 bpm. Avg HR 81 bpm, in sinus rhythm. 211 episodes of VT, fastest at 222 bpm for 4  beats, longest for 13 at 106 bpm. 18.1% isolated VE, 5.4% couplet, <1% triplets. 39 episodes of SVT/atrial tachycardia, fastest at 164 bpm for 16 beats, longest for 18.6 secs at 123 bpm. 1% isolated SVE, <1% couplet/triplets. No atrial fibrillation/atrial flutter/VT/high grade AV block, sinus pause >3sec noted. 0 patient triggered events.     Physical Exam:   Physical Exam Vitals and nursing note reviewed.  Constitutional:      General: He is not in acute distress. Neck:     Vascular: No JVD.  Cardiovascular:     Rate and Rhythm: Normal rate and regular rhythm. Frequent Extrasystoles are present.    Heart sounds: Normal heart sounds. No murmur heard. Pulmonary:     Effort: Pulmonary effort is normal.     Breath sounds: Normal breath sounds. No wheezing or rales.  Musculoskeletal:     Right lower leg: No edema.     Left lower leg: No edema.      VISIT DIAGNOSES:   ICD-10-CM   1. Primary hypertension  I10 EKG 12-Lead    Basic metabolic panel    2. PVC (premature ventricular contraction)  I49.3 sacubitril-valsartan (ENTRESTO) 24-26 MG    metoprolol succinate (TOPROL XL) 25 MG 24 hr tablet    3. HFrEF (heart failure with reduced ejection fraction) (HCC)  I50.20 sacubitril-valsartan (ENTRESTO) 24-26 MG    metoprolol succinate (TOPROL XL) 25 MG 24 hr tablet    Basic metabolic panel    Pro b natriuretic peptide (BNP)    MYOCARDIAL PERFUSION IMAGING    Pro b natriuretic  peptide (BNP)    Cardiac Stress Test: Informed Consent Details: Physician/Practitioner Attestation; Transcribe to consent form and obtain patient signature    4. Mixed hyperlipidemia  E78.2 Lipid Profile    Lipid Profile    5. NSVT (nonsustained ventricular tachycardia) (HCC)  I47.29     6. Atrial tachycardia (HCC)  I47.19        ASSESSMENT AND PLAN: .    ZHION PEVEHOUSE is a 63 y.o. male with hypertension, frequent symptomatic PVCs.  Reduced LVEF, possibly arrhythmia induced  cardiomyopathy  HFrEF: No clear anginal symptoms, but has exertional dyspnea.  18% PVC, as well as frequent episodes of NSVT, up to 13 beats.  He also had episodes of atrial tachycardia/SVT up to 18 seconds. Global hypokinesis with EF of 40-45%. Recommend Lexiscan nuclear stress test.  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 After much discussion, he is open to starting medical therapy as follows. Stop lisinopril-hydrochlorothiazide, and start Entresto 24-26 mg twice daily. Also start metoprolol succinate 25 mg daily. Check BMP, proBNP, and lipid panel in 1 week.  He is follow-up scheduled with Robin Searing, NP in early March, where follow-up titration of GDMT could be performed.  I would recommend repeating EKG at that time.  If no improvement in symptoms, and PVCs persist on EKG, and if no ischemia on upcoming stress test, he may need EP referral for arrhythmia and his cardiomyopathy.   Informed Consent   Shared Decision Making/Informed Consent The risks [chest pain, shortness of breath, cardiac arrhythmias, dizziness, blood pressure fluctuations, myocardial infarction, stroke/transient ischemic attack, nausea, vomiting, allergic reaction, radiation exposure, metallic taste sensation and life-threatening complications (estimated to be 1 in 10,000)], benefits (risk stratification, diagnosing coronary artery disease, treatment guidance) and alternatives of a nuclear stress test were discussed in detail with Mr. Beauchaine and he agrees to proceed.       Meds ordered this encounter  Medications   sacubitril-valsartan (ENTRESTO) 24-26 MG    Sig: Take 1 tablet by mouth 2 (two) times daily.    Dispense:  60 tablet    Refill:  2    Please Honor Card patient is presenting for Cecelia Byars: [id number]; Alvira Philips: [id number]; RXPCN: [id number]; RXID: [id number]   metoprolol succinate (TOPROL XL) 25 MG 24 hr tablet    Sig: Take 1 tablet (25 mg total) by mouth daily.    Dispense:   90 tablet    Refill:  1     F/u in March 2025  Signed, Elder Negus, MD

## 2023-10-27 NOTE — Patient Instructions (Signed)
Medication Instructions:   STOP TAKING LISINOPRIL-hydrochlorothiazide NOW  START TAKING ENTRESTO 24/26 MG DOSE--TAKE ONE TABLET BY MOUTH TWICE DAILY--DO NOT START TAKING THIS UNTIL THIS SUNDAY MORNING  START TAKING METOPROLOL SUCCINATE (TOPROL XL) 25 MG BY MOUTH DAILY  *If you need a refill on your cardiac medications before your next appointment, please call your pharmacy*   Lab Work:  IN ONE WEEK HERE ON FIRST FLOOR AT [facility]--NEXT FRIDAY 11/03/23--PRO-BNP, BMET, AND LIPIDS--PLEASE COME FASTING  If you have labs (blood work) drawn today and your tests are completely normal, you will receive your results only by: MyChart Message (if you have MyChart) OR A paper copy in the mail If you have any lab test that is abnormal or we need to change your treatment, we will call you to review the results.   Testing/Procedures:  Your physician has requested that you have a lexiscan myoview. For further information please visit [email or web address] Please follow instruction sheet, as given.    Follow-Up:  AS SCHEDULED WITH ERNEST DICK NP ON 12/13/23

## 2023-11-02 ENCOUNTER — Telehealth: Payer: Self-pay | Admitting: Cardiology

## 2023-11-02 ENCOUNTER — Telehealth: Payer: Self-pay

## 2023-11-02 NOTE — Telephone Encounter (Signed)
Copay card info has been provided to pharmacy with billing instructions. Informed patient of copay card enrollment via mychart.

## 2023-11-02 NOTE — Telephone Encounter (Signed)
Patient Advocate Encounter   The patient was enrolled in a copay card that will help cover the cost of [facility]   Pharmacy Card Information RXBIN: [id number] PCN: [id number] GROUP: [id number] ID: [id number]  Pharmacy provided with approval and processing information. Patient informed via Dorcas Carrow, CPhT  Pharmacy Patient Advocate Specialist  Direct Number: [phone] Fax: [phone]

## 2023-11-02 NOTE — Telephone Encounter (Signed)
Pt aware that he was suppose to have labs today as ordered in the office visit last week.  He is aware we prefer fasting, for he will be getting his lipids checked.  He stated he is coming in soon to get this done today.  He is aware that his stress test instructions are in mychart for him to refer to when having the test next week.   Pt did state his entresto is costing him $160 a month and wanted to know if there should be a PA done for this.  He is aware that I will route this to our PA team to further assist the pt with this matter.  Pt verbalized understanding and agrees with this plan.

## 2023-11-02 NOTE — Telephone Encounter (Signed)
Pt is requesting a callback to find out if he needs to have his labs done before his test on 2/5 or before his March appt. Please advise

## 2023-11-02 NOTE — Telephone Encounter (Signed)
He just needs to sign up for a copay card. I have activated one for him  BIN [id number] PCN [facility] GRP [id number] ID [id number]  Dorathy Daft can you do me a huge favor and call this info into the pt pharmacy and let the pt know?

## 2023-11-03 LAB — BASIC METABOLIC PANEL
BUN/Creatinine Ratio: 13 (ref 10–24)
BUN: 14 mg/dL (ref 8–27)
CO2: 23 mmol/L (ref 20–29)
Calcium: 9.9 mg/dL (ref 8.6–10.2)
Chloride: 103 mmol/L (ref 96–106)
Creatinine, Ser: 1.05 mg/dL (ref 0.76–1.27)
Glucose: 96 mg/dL (ref 70–99)
Potassium: 4.7 mmol/L (ref 3.5–5.2)
Sodium: 142 mmol/L (ref 134–144)
eGFR: 81 mL/min/{1.73_m2} (ref >59)

## 2023-11-03 LAB — PRO B NATRIURETIC PEPTIDE: NT-Pro BNP: 48 pg/mL (ref 0–210)

## 2023-11-03 LAB — LIPID PANEL
Chol/HDL Ratio: 3 {ratio} (ref 0.0–5.0)
Cholesterol, Total: 242 mg/dL — ABNORMAL HIGH (ref 100–199)
HDL: 80 mg/dL (ref >39)
LDL Chol Calc (NIH): 149 mg/dL — ABNORMAL HIGH (ref 0–99)
Triglycerides: 79 mg/dL (ref 0–149)
VLDL Cholesterol Cal: 13 mg/dL (ref 5–40)

## 2023-11-03 NOTE — Progress Notes (Signed)
LDL has steadily increased in last 3 years. Recommend heart healthy diet (Mediterranean style diet). I understand exercise is limited currently due to his shortness of breath. Ideally, recommend starting statin-Crestor 20 mg daily. If we start statin now, repeat lipid panel in 3 months.  If patient is not keen, could consider calcium score scan for further risk stratification.  Thanks MJP

## 2023-11-08 ENCOUNTER — Ambulatory Visit (HOSPITAL_COMMUNITY): Payer: BC Managed Care – PPO | Attending: Cardiology

## 2023-11-08 DIAGNOSIS — I5020 Unspecified systolic (congestive) heart failure: Secondary | ICD-10-CM | POA: Diagnosis not present

## 2023-11-08 LAB — MYOCARDIAL PERFUSION IMAGING
Base ST Depression (mm): 0 mm
Estimated workload: 1
Exercise duration (min): 1 min
Exercise duration (sec): 0 s
LV dias vol: 138 mL (ref 62–150)
LV sys vol: 69 mL
MPHR: 163 {beats}/min
Nuc Stress EF: 50 %
Peak HR: 102 {beats}/min
Percent HR: 63 %
Rest HR: 65 {beats}/min
Rest Nuclear Isotope Dose: 10.5 mCi
SDS: 3
SRS: 2
SSS: 5
ST Depression (mm): 0 mm
Stress Nuclear Isotope Dose: 31.8 mCi
TID: 0.96

## 2023-11-08 MED ORDER — TECHNETIUM TC 99M TETROFOSMIN IV KIT
10.5000 | PACK | Freq: Once | INTRAVENOUS | Status: AC | PRN
Start: 2023-11-08 — End: 2023-11-08
  Administered 2023-11-08: 10.5 via INTRAVENOUS

## 2023-11-08 MED ORDER — REGADENOSON 0.4 MG/5ML IV SOLN
0.4000 mg | Freq: Once | INTRAVENOUS | Status: AC
  Administered 2023-11-08: 0.4 mg via INTRAVENOUS

## 2023-11-08 MED ORDER — TECHNETIUM TC 99M TETROFOSMIN IV KIT
31.8000 | PACK | Freq: Once | INTRAVENOUS | Status: AC | PRN
Start: 2023-11-08 — End: 2023-11-08
  Administered 2023-11-08: 31.8 via INTRAVENOUS

## 2023-11-08 NOTE — Progress Notes (Signed)
 Stress test abnormality likely due to diaphragmatic attenuation (shadow caused by diaphragm on the heart), without any clear evidence to suggest blockages in heart arteries.  He is seeing [name] next month where we can repeat EKG to reassess PVC burden.  Thanks MJP

## 2023-11-14 ENCOUNTER — Telehealth: Payer: Self-pay | Admitting: Cardiology

## 2023-11-14 NOTE — Telephone Encounter (Signed)
Pt returning call to a nurse

## 2023-11-14 NOTE — Telephone Encounter (Signed)
Left message to call back for results

## 2023-11-14 NOTE — Telephone Encounter (Signed)
Patient is returning call to discuss echo results.

## 2023-11-14 NOTE — Telephone Encounter (Signed)
Left message to call back

## 2023-11-15 NOTE — Telephone Encounter (Signed)
Spoke to pt. Please review result notes.

## 2023-11-16 NOTE — Progress Notes (Signed)
Noted.  Thanks MJP

## 2023-12-12 NOTE — Progress Notes (Unsigned)
 Cardiology Office Note    Patient Name: Jeffery [name] Date of Encounter: 12/12/2023  Primary Care Provider:  Etta Grandchild, MD Primary Cardiologist:  None Primary Electrophysiologist: None   Past Medical History    Past Medical History:  Diagnosis Date   Anemia    Central serous retinopathy    hx/o intraocular injection therapy for 3 years; no change after 3 years of therapy, no visual c/o   Depression    Former smoker    History of blood transfusion 04/17/2017   "related to anemia"   History of kidney stones    Hypertension    Left varicocele    Port-wine stain of face    Pre-diabetes     History of Present Illness  Jeffery [name] is a 63 y.o. male with a PMH of HFrEF, PVCs, HTN, NICM who presents for 68-month follow-up.  Jeffery [name] was seen initially by Dr. Enid Derry on 09/14/2023 for complaint of palpitations.  He wore a ZIO monitor that showed PACs and PVCs with 18% burden.  He also completed a 2D echo that revealed mildly decreased EF of 40-45% with global hypokinesis and mild asymmetric LVH.  He was started on metoprolol and was seen in follow-up on 10/23/2023.  During visit he continued to endorse exertional dyspnea and vague symptoms of chest pain.  He underwent a Lexiscan Myoview that showed no ST deviation with diaphragmatic attenuation and no evidence of ischemia found to be low risk.  He was also started on GDMT with Entresto 24/26 mg and continued on Toprol-XL.  Jeffery [name] presents today for 70-month follow-up.  He reports feeling better overall, attributing this improvement to medication and stress management. He has been experiencing swelling in his ankles, particularly after long work hours.  He has been compliant with Entresto and metoprolol but does note taking Entresto once daily due to cost.  He believes that stress and anxiety from his work environment may have contributed to his heart issues. The patient expresses a desire to minimize medication  intake, but acknowledges the need for ongoing treatment for his heart condition.  He completed an EKG today that showed 1 PVC with no other episodes of ectopy.  Patient denies chest pain, palpitations, dyspnea, PND, orthopnea, nausea, vomiting, dizziness, syncope, edema, weight gain, or early satiety.   Review of Systems  Please see the history of present illness.    All other systems reviewed and are otherwise negative except as noted above.  Physical Exam    Wt Readings from Last 3 Encounters:  11/08/23 192 lb (87.1 kg)  10/27/23 192 lb (87.1 kg)  09/14/23 182 lb 6.4 oz (82.7 kg)   ZO:XWRUE were no vitals filed for this visit.,There is no height or weight on file to calculate BMI. GEN: Well nourished, well developed in no acute distress Neck: No JVD; No carotid bruits Pulmonary: Clear to auscultation without rales, wheezing or rhonchi  Cardiovascular: Normal rate. Regular rhythm. Normal S1. Normal S2.   Murmurs: There is no murmur.  ABDOMEN: Soft, non-tender, non-distended EXTREMITIES:  No edema; No deformity   EKG/LABS/ Recent Cardiac Studies   ECG personally reviewed by me today -sinus rhythm with rate of 65 bpm and no PVCs or ectopy present  Risk Assessment/Calculations:          Lab Results  Component Value Date   WBC 5.3 07/26/2023   HGB 15.3 07/26/2023   HCT 45.5 07/26/2023   MCV 93.4 07/26/2023   PLT 238.0 07/26/2023  Lab Results  Component Value Date   CREATININE 1.05 11/02/2023   BUN 14 11/02/2023   NA 142 11/02/2023   K 4.7 11/02/2023   CL 103 11/02/2023   CO2 23 11/02/2023   Lab Results  Component Value Date   CHOL 242 (H) 11/02/2023   HDL 80 11/02/2023   LDLCALC 149 (H) 11/02/2023   TRIG 79 11/02/2023   CHOLHDL 3.0 11/02/2023    Lab Results  Component Value Date   HGBA1C 6.3 07/26/2023   Assessment & Plan    1.  HFrEF: -2D echo completed showing EF of 40-45% in the setting of arrhythmia induced cardiomyopathy -Patient is euvolemic on  examination and reports improvement to shortness of breath and fatigue. -We will titrate GDMT with addition of spironolactone 12.5 mg daily -Continue Entresto 24/26 mg twice daily, Toprol-XL 25 mg daily -Will repeat 2D echo in 3 months -Low sodium diet, fluid restriction <2L, and daily weights encouraged. Educated to contact our office for weight gain of 2 lbs overnight or 5 lbs in one week.   2.  History of PVCs: -Recent event monitor revealed 20-11 episodes of VT with 18.1% burden of PVC with no evidence of AF or high-grade block. -EKG completed today showing no PVCs sinus rhythm and controlled heart rate -Continue Toprol-XL 25 mg daily  3.  Primary hypertension: -Patient's blood pressure today was stable at 132/70 -Continue Toprol-XL 25 mg daily and Entresto 24/26 mg twice daily as noted above  4.  Hyperlipidemia: -Patient's LDL cholesterol was elevated above goal at 149 -He will focus on lifestyle modification and increase physical activity. -We will recheck LFT and lipids in 3 months with plan to initiate statin therapy at that time if cholesterol is elevated.  5.  NICM: -We will repeat 2D echo in 3 months after optimization of GDMT -Continue current GDMT with Entresto 24/26 mg twice daily, Toprol-XL 25 mg, spironolactone 12.5 mg daily  Disposition: Follow-up with None or APP in 4 months    Signed, Napoleon Form, Leodis Rains, NP 12/12/2023, 10:39 AM [facility]

## 2023-12-13 ENCOUNTER — Other Ambulatory Visit: Payer: Self-pay

## 2023-12-13 ENCOUNTER — Encounter: Payer: Self-pay | Admitting: Nurse Practitioner

## 2023-12-13 ENCOUNTER — Other Ambulatory Visit (HOSPITAL_BASED_OUTPATIENT_CLINIC_OR_DEPARTMENT_OTHER): Payer: Self-pay

## 2023-12-13 ENCOUNTER — Ambulatory Visit: Payer: BC Managed Care – PPO | Attending: Nurse Practitioner | Admitting: Nurse Practitioner

## 2023-12-13 VITALS — BP 132/70 | HR 65 | Ht 75.0 in | Wt 193.0 lb

## 2023-12-13 DIAGNOSIS — I493 Ventricular premature depolarization: Secondary | ICD-10-CM

## 2023-12-13 DIAGNOSIS — I5020 Unspecified systolic (congestive) heart failure: Secondary | ICD-10-CM

## 2023-12-13 DIAGNOSIS — I10 Essential (primary) hypertension: Secondary | ICD-10-CM

## 2023-12-13 DIAGNOSIS — E782 Mixed hyperlipidemia: Secondary | ICD-10-CM

## 2023-12-13 DIAGNOSIS — I428 Other cardiomyopathies: Secondary | ICD-10-CM

## 2023-12-13 MED ORDER — ENTRESTO 24-26 MG PO TABS
1.0000 | ORAL_TABLET | Freq: Two times a day (BID) | ORAL | 1 refills | Status: DC
  Filled 2023-12-13: qty 60, 30d supply, fill #0

## 2023-12-13 MED ORDER — BLOOD PRESSURE CUFF MISC: 1.0000 | Freq: Every day | 0 refills | Status: DC

## 2023-12-13 MED ORDER — SPIRONOLACTONE 25 MG PO TABS
12.5000 mg | ORAL_TABLET | Freq: Every day | ORAL | 1 refills | Status: DC
  Filled 2023-12-13: qty 15, 30d supply, fill #0

## 2023-12-13 NOTE — Patient Instructions (Addendum)
 Medication Instructions:  START Spironolactone 12.5mg  take 1 tablet once a day (tablet comes in 25mg  break in half) *If you need a refill on your cardiac medications before your next appointment, please call your pharmacy*   Lab Work: 2 WEEKS BMET 3 MONTHS FASTING LIPIDS & LFTs If you have labs (blood work) drawn today and your tests are completely normal, you will receive your results only by: MyChart Message (if you have MyChart) OR A paper copy in the mail If you have any lab test that is abnormal or we need to change your treatment, we will call you to review the results.   Testing/Procedures: Your physician has requested that you have an echocardiogram. Echocardiography is a painless test that uses sound waves to create images of your heart. It provides your doctor with information about the size and shape of your heart and how well your heart's chambers and valves are working. This procedure takes approximately one hour. There are no restrictions for this procedure. Please do NOT wear cologne, perfume, aftershave, or lotions (deodorant is allowed). Please arrive 15 minutes prior to your appointment time.  Please note: We ask at that you not bring children with you during ultrasound (echo/ vascular) testing. Due to room size and safety concerns, children are not allowed in the ultrasound rooms during exams. Our front office staff cannot provide observation of children in our lobby area while testing is being conducted. An adult accompanying a patient to their appointment will only be allowed in the ultrasound room at the discretion of the ultrasound technician under special circumstances. We apologize for any inconvenience.  Follow-Up: At [facility], you and your health needs are our priority.  As part of our continuing mission to provide you with exceptional heart care, we have created designated Provider Care Teams.  These Care Teams include your primary Cardiologist  (physician) and Advanced Practice Providers (APPs -  Physician Assistants and Nurse Practitioners) who all work together to provide you with the care you need, when you need it.  We recommend signing up for the patient portal called "MyChart".  Sign up information is provided on this After Visit Summary.  MyChart is used to connect with patients for Virtual Visits (Telemedicine).  Patients are able to view lab/test results, encounter notes, upcoming appointments, etc.  Non-urgent messages can be sent to your provider as well.   To learn more about what you can do with MyChart, go to [email or web address].    Your next appointment:   4 month(s)  Provider:   Robin Searing, NP       Other Instructions: Heart-Healthy Eating Plan Eating a healthy diet is important for the health of your heart. A heart-healthy eating plan includes: Eating less unhealthy fats. Eating more healthy fats. Eating less salt in your food. Salt is also called sodium. Making other changes in your diet. Talk with your doctor or a diet specialist (dietitian) to create an eating plan that is right for you. What is my plan? Your doctor may recommend an eating plan that includes: Total fat: ______% or less of total calories a day. Saturated fat: ______% or less of total calories a day. Cholesterol: less than _________mg a day. Sodium: less than _________mg a day. What are tips for following this plan? Cooking Avoid frying your food. Try to bake, boil, grill, or broil it instead. You can also reduce fat by: Removing the skin from poultry. Removing all visible fats from meats. Steaming vegetables in water or broth.  Meal planning  At meals, divide your plate into four equal parts: Fill one-half of your plate with vegetables and green salads. Fill one-fourth of your plate with whole grains. Fill one-fourth of your plate with lean protein foods. Eat 2-4 cups of vegetables per day. One cup of vegetables is: 1 cup (91 g)  broccoli or cauliflower florets. 2 medium carrots. 1 large bell pepper. 1 large sweet potato. 1 large tomato. 1 medium white potato. 2 cups (150 g) raw leafy greens. Eat 1-2 cups of fruit per day. One cup of fruit is: 1 small apple 1 large banana 1 cup (237 g) mixed fruit, 1 large orange,  cup (82 g) dried fruit, 1 cup (240 mL) 100% fruit juice. Eat more foods that have soluble fiber. These are apples, broccoli, carrots, beans, peas, and barley. Try to get 20-30 g of fiber per day. Eat 4-5 servings of nuts, legumes, and seeds per week: 1 serving of dried beans or legumes equals  cup (90 g) cooked. 1 serving of nuts is  oz (12 almonds, 24 pistachios, or 7 walnut halves). 1 serving of seeds equals  oz (8 g). General information Eat more home-cooked food. Eat less restaurant, buffet, and fast food. Limit or avoid alcohol. Limit foods that are high in starch and sugar. Avoid fried foods. Lose weight if you are overweight. Keep track of how much salt (sodium) you eat. This is important if you have high blood pressure. Ask your doctor to tell you more about this. Try to add vegetarian meals each week. Fats Choose healthy fats. These include olive oil and canola oil, flaxseeds, walnuts, almonds, and seeds. Eat more omega-3 fats. These include salmon, mackerel, sardines, tuna, flaxseed oil, and ground flaxseeds. Try to eat fish at least 2 times each week. Check food labels. Avoid foods with trans fats or high amounts of saturated fat. Limit saturated fats. These are often found in animal products, such as meats, butter, and cream. These are also found in plant foods, such as palm oil, palm kernel oil, and coconut oil. Avoid foods with partially hydrogenated oils in them. These have trans fats. Examples are stick margarine, some tub margarines, cookies, crackers, and other baked goods. What foods should I eat? Fruits All fresh, canned (in natural juice), or frozen  fruits. Vegetables Fresh or frozen vegetables (raw, steamed, roasted, or grilled). Green salads. Grains Most grains. Choose whole wheat and whole grains most of the time. Rice and pasta, including brown rice and pastas made with whole wheat. Meats and other proteins Lean, well-trimmed beef, veal, pork, and lamb. Chicken and [place] without skin. All fish and shellfish. Wild duck, rabbit, pheasant, and venison. Egg whites or low-cholesterol egg substitutes. Dried beans, peas, lentils, and tofu. Seeds and most nuts. Dairy Low-fat or nonfat cheeses, including ricotta and mozzarella. Skim or 1% milk that is liquid, powdered, or evaporated. Buttermilk that is made with low-fat milk. Nonfat or low-fat yogurt. Fats and oils Non-hydrogenated (trans-free) margarines. Vegetable oils, including soybean, sesame, sunflower, olive, peanut, safflower, corn, canola, and cottonseed. Salad dressings or mayonnaise made with a vegetable oil. Beverages Mineral water. Coffee and tea. Diet carbonated beverages. Sweets and desserts Sherbet, gelatin, and fruit ice. Small amounts of dark chocolate. Limit all sweets and desserts. Seasonings and condiments All seasonings and condiments. The items listed above may not be a complete list of foods and drinks you can eat. Contact a dietitian for more options. What foods should I avoid? Fruits Canned fruit in heavy syrup.  Fruit in cream or butter sauce. Fried fruit. Limit coconut. Vegetables Vegetables cooked in cheese, cream, or butter sauce. Fried vegetables. Grains Breads that are made with saturated or trans fats, oils, or whole milk. Croissants. Sweet rolls. Donuts. High-fat crackers, such as cheese crackers. Meats and other proteins Fatty meats, such as hot dogs, ribs, sausage, bacon, rib-eye roast or steak. High-fat deli meats, such as salami and bologna. Caviar. Domestic duck and goose. Organ meats, such as liver. Dairy Cream, sour cream, cream cheese, and  creamed cottage cheese. Whole-milk cheeses. Whole or 2% milk that is liquid, evaporated, or condensed. Whole buttermilk. Cream sauce or high-fat cheese sauce. Yogurt that is made from whole milk. Fats and oils Meat fat, or shortening. Cocoa butter, hydrogenated oils, palm oil, coconut oil, palm kernel oil. Solid fats and shortenings, including bacon fat, salt pork, lard, and butter. Nondairy cream substitutes. Salad dressings with cheese or sour cream. Beverages Regular sodas and juice drinks with added sugar. Sweets and desserts Frosting. Pudding. Cookies. Cakes. Pies. Milk chocolate or white chocolate. Buttered syrups. Full-fat ice cream or ice cream drinks. The items listed above may not be a complete list of foods and drinks to avoid. Contact a dietitian for more information. Summary Heart-healthy meal planning includes eating less unhealthy fats, eating more healthy fats, and making other changes in your diet. Eat a balanced diet. This includes fruits and vegetables, low-fat or nonfat dairy, lean protein, nuts and legumes, whole grains, and heart-healthy oils and fats. This information is not intended to replace advice given to you by your health care provider. Make sure you discuss any questions you have with your health care provider. Document Revised: 10/25/2021 Document Reviewed: 10/25/2021 Elsevier Patient Education  2024 Elsevier Inc.     Low-Sodium Eating Plan Salt (sodium) helps you keep a healthy balance of fluids in your body. Too much sodium can raise your blood pressure. It can also cause fluid and waste to be held in your body. Your health care provider or dietitian may recommend a low-sodium eating plan if you have high blood pressure (hypertension), kidney disease, liver disease, or heart failure. Eating less sodium can help lower your blood pressure and reduce swelling. It can also protect your heart, liver, and kidneys. What are tips for following this plan? Reading food  labels  Check food labels for the amount of sodium per serving. If you eat more than one serving, you must multiply the listed amount by the number of servings. Choose foods with less than 140 milligrams (mg) of sodium per serving. Avoid foods with 300 mg of sodium or more per serving. Always check how much sodium is in a product, even if the label says "unsalted" or "no salt added." Shopping  Buy products labeled as "low-sodium" or "no salt added." Buy fresh foods. Avoid canned foods and pre-made or frozen meals. Avoid canned, cured, or processed meats. Buy breads that have less than 80 mg of sodium per slice. Cooking  Eat more home-cooked food. Try to eat less restaurant, buffet, and fast food. Try not to add salt when you cook. Use salt-free seasonings or herbs instead of table salt or sea salt. Check with your provider or pharmacist before using salt substitutes. Cook with plant-based oils, such as canola, sunflower, or olive oil. Meal planning When eating at a restaurant, ask if your food can be made with less salt or no salt. Avoid dishes labeled as brined, pickled, cured, or smoked. Avoid dishes made with soy sauce, miso,  or teriyaki sauce. Avoid foods that have monosodium glutamate (MSG) in them. MSG may be added to some restaurant food, sauces, soups, bouillon, and canned foods. Make meals that can be grilled, baked, poached, roasted, or steamed. These are often made with less sodium. General information Try to limit your sodium intake to 1,500-2,300 mg each day, or the amount told by your provider. What foods should I eat? Fruits Fresh, frozen, or canned fruit. Fruit juice. Vegetables Fresh or frozen vegetables. "No salt added" canned vegetables. "No salt added" tomato sauce and paste. Low-sodium or reduced-sodium tomato and vegetable juice. Grains Low-sodium cereals, such as oats, puffed wheat and rice, and shredded wheat. Low-sodium crackers. Unsalted rice. Unsalted pasta.  Low-sodium bread. Whole grain breads and whole grain pasta. Meats and other proteins Fresh or frozen meat, poultry, seafood, and fish. These should have no added salt. Low-sodium canned tuna and salmon. Unsalted nuts. Dried peas, beans, and lentils without added salt. Unsalted canned beans. Eggs. Unsalted nut butters. Dairy Milk. Soy milk. Cheese that is naturally low in sodium, such as ricotta cheese, fresh mozzarella, or Swiss cheese. Low-sodium or reduced-sodium cheese. Cream cheese. Yogurt. Seasonings and condiments Fresh and dried herbs and spices. Salt-free seasonings. Low-sodium mustard and ketchup. Sodium-free salad dressing. Sodium-free light mayonnaise. Fresh or refrigerated horseradish. Lemon juice. Vinegar. Other foods Homemade, reduced-sodium, or low-sodium soups. Unsalted popcorn and pretzels. Low-salt or salt-free chips. The items listed above may not be all the foods and drinks you can have. Talk to a dietitian to learn more. What foods should I avoid? Vegetables Sauerkraut, pickled vegetables, and relishes. Olives. [place] fries. Onion rings. Regular canned vegetables, except low-sodium or reduced-sodium items. Regular canned tomato sauce and paste. Regular tomato and vegetable juice. Frozen vegetables in sauces. Grains Instant hot cereals. Bread stuffing, pancake, and biscuit mixes. Croutons. Seasoned rice or pasta mixes. Noodle soup cups. Boxed or frozen macaroni and cheese. Regular salted crackers. Self-rising flour. Meats and other proteins Meat or fish that is salted, canned, smoked, spiced, or pickled. Precooked or cured meat, such as sausages or meat loaves. Tomasa Blase. Ham. Pepperoni. Hot dogs. Corned beef. Chipped beef. Salt pork. Jerky. Pickled herring, anchovies, and sardines. Regular canned tuna. Salted nuts. Dairy Processed cheese and cheese spreads. Hard cheeses. Cheese curds. Blue cheese. Feta cheese. String cheese. Regular cottage cheese. Buttermilk. Canned milk. Fats  and oils Salted butter. Regular margarine. Ghee. Bacon fat. Seasonings and condiments Onion salt, garlic salt, seasoned salt, table salt, and sea salt. Canned and packaged gravies. Worcestershire sauce. Tartar sauce. Barbecue sauce. Teriyaki sauce. Soy sauce, including reduced-sodium soy sauce. Steak sauce. Fish sauce. Oyster sauce. Cocktail sauce. Horseradish that you find on the shelf. Regular ketchup and mustard. Meat flavorings and tenderizers. Bouillon cubes. Hot sauce. Pre-made or packaged marinades. Pre-made or packaged taco seasonings. Relishes. Regular salad dressings. Salsa. Other foods Salted popcorn and pretzels. Corn chips and puffs. Potato and tortilla chips. Canned or dried soups. Pizza. Frozen entrees and pot pies. The items listed above may not be all the foods and drinks you should avoid. Talk to a dietitian to learn more. This information is not intended to replace advice given to you by your health care provider. Make sure you discuss any questions you have with your health care provider. Document Revised: 10/06/2022 Document Reviewed: 10/06/2022 Elsevier Patient Education  2024 [employer].

## 2023-12-15 ENCOUNTER — Other Ambulatory Visit (HOSPITAL_BASED_OUTPATIENT_CLINIC_OR_DEPARTMENT_OTHER): Payer: Self-pay

## 2023-12-15 MED ORDER — OMRON 3 SERIES BP MONITOR DEVI
0 refills | Status: DC
  Filled 2024-03-07 – 2024-04-30 (×2): qty 1, 30d supply, fill #0

## 2023-12-27 ENCOUNTER — Ambulatory Visit: Payer: Self-pay | Admitting: *Deleted

## 2023-12-27 NOTE — Telephone Encounter (Signed)
  Chief Complaint: blood in semen, back pain, requesting labs tomorrow if possible due not being able to get off of work easily  Symptoms: blood in semen Sunday and yesterday small amount . No pain. No blood in urine. Back pain at times but feels unrelated to blood in semen. Reports back pain may be work related soreness.  Frequency: Sunday and yesterday  Pertinent Negatives: Patient denies fever no blood in urine no pain. No swelling reported Disposition: [] ED /[] Urgent Care (no appt availability in office) / [x] Appointment(In office/virtual)/ []  [facility]/ [] Home Care/ [] Refused Recommended Disposition /[] [facility]/ []  Follow-up with PCP Additional Notes:   No available appt with PCP. Scheduled appt tomorrow due to patient working and only day off tomorrow. Requesting if blood work can be Copy . Please advise.     Copied from CRM [id number]. Topic: Clinical - Red Word Triage >> Dec 27, 2023 10:34 AM Fredrich Romans wrote: Red Word that prompted transfer to Nurse Triage: blood in semen(Sunday and yesterday) Reason for Disposition  Blood in semen  Answer Assessment - Initial Assessment Questions 1. SYMPTOM: "What's the main symptom you're concerned about?" (e.g., discharge from penis, rash, pain, itching, swelling)     Blood in semen hx prostatitis  2. LOCATION: "Where is the sx located?"     Back pain , blood noted in semen  3. ONSET: "When did sx  start?"     Sunday and yesterday  4. PAIN: "Is there any pain?" If Yes, ask: "How bad is it?"  (Scale 1-10; or mild, moderate, severe)      Pain in back at times feels may be unrelated to blood in semen 5. URINE: "Any difficulty passing urine?" If Yes, ask: "When was the last time?"     none 6. CAUSE: "What do you think is causing the symptoms?"     Possible prostititis 7. OTHER SYMPTOMS: "Do you have any other symptoms?" (e.g., fever, abdomen pain, blood in urine)     Low back pain blood in semen.  Protocols  used: Penis and Scrotum Symptoms-A-AH

## 2023-12-28 ENCOUNTER — Encounter: Payer: Self-pay | Admitting: Internal Medicine

## 2023-12-28 ENCOUNTER — Ambulatory Visit: Admitting: Internal Medicine

## 2023-12-28 VITALS — BP 120/82 | HR 65 | Temp 97.7°F | Ht 75.0 in | Wt 193.0 lb

## 2023-12-28 DIAGNOSIS — G8929 Other chronic pain: Secondary | ICD-10-CM

## 2023-12-28 DIAGNOSIS — R7303 Prediabetes: Secondary | ICD-10-CM

## 2023-12-28 DIAGNOSIS — R361 Hematospermia: Secondary | ICD-10-CM | POA: Diagnosis not present

## 2023-12-28 DIAGNOSIS — E559 Vitamin D deficiency, unspecified: Secondary | ICD-10-CM | POA: Diagnosis not present

## 2023-12-28 DIAGNOSIS — F329 Major depressive disorder, single episode, unspecified: Secondary | ICD-10-CM

## 2023-12-28 DIAGNOSIS — M545 Low back pain, unspecified: Secondary | ICD-10-CM

## 2023-12-28 DIAGNOSIS — I10 Essential (primary) hypertension: Secondary | ICD-10-CM

## 2023-12-28 LAB — URINALYSIS, ROUTINE W REFLEX MICROSCOPIC
Bilirubin Urine: NEGATIVE
Hgb urine dipstick: NEGATIVE
Ketones, ur: NEGATIVE
Leukocytes,Ua: NEGATIVE
Nitrite: NEGATIVE
RBC / HPF: NONE SEEN (ref 0–?)
Specific Gravity, Urine: <=1.005 — AB (ref 1.000–1.030)
Total Protein, Urine: NEGATIVE
Urine Glucose: NEGATIVE
Urobilinogen, UA: 0.2 (ref 0.0–1.0)
WBC, UA: NONE SEEN (ref 0–?)
pH: 6 (ref 5.0–8.0)

## 2023-12-28 NOTE — Progress Notes (Signed)
 Patient ID: Jeffery [name], male   DOB: 30-May-1961, 63 y.o.   MRN: [id number]        Chief Complaint: follow up hematospermia, low back pain, depression, preDM, htn, low vit d       HPI:  Jeffery [name] is a 63 y.o. male here with c/o blood x 2 times to ejaculate recently, Denies urinary symptoms such as dysuria, frequency, urgency, flank pain, hematuria or n/v, fever, chills. Pt denies chest pain, increased sob or doe, wheezing, orthopnea, PND, increased LE swelling, palpitations, dizziness or syncope.   Pt denies polydipsia, polyuria, or new focal neuro s/s.   Pt continues to have recurring LBP without change in severity, bowel or bladder change, fever, wt loss,  worsening LE pain/numbness/weakness, gait change or falls. Denies worsening depressive symptoms, suicidal ideation, or panic       Wt Readings from Last 3 Encounters:  12/28/23 193 lb (87.5 kg)  12/13/23 193 lb (87.5 kg)  11/08/23 192 lb (87.1 kg)   BP Readings from Last 3 Encounters:  12/28/23 120/82  12/13/23 132/70  10/27/23 120/60         Past Medical History:  Diagnosis Date   Anemia    Central serous retinopathy    hx/o intraocular injection therapy for 3 years; no change after 3 years of therapy, no visual c/o   Depression    Former smoker    History of blood transfusion 04/17/2017   "related to anemia"   History of kidney stones    Hypertension    Left varicocele    Port-wine stain of face    Pre-diabetes    Past Surgical History:  Procedure Laterality Date   COLONOSCOPY     never   EXTRACORPOREAL SHOCK WAVE LITHOTRIPSY     HERNIA REPAIR     PALATE / UVULA BIOPSY / EXCISION  2006   "polyp on uvula cut out"    reports that he quit smoking about 26 years ago. His smoking use included cigarettes. He started smoking about 41 years ago. He has a 15 pack-year smoking history. He has never used smokeless tobacco. He reports that he does not currently use alcohol. He reports current drug use. Drug:  Marijuana. family history includes Diabetes in his father; Heart disease in his mother; Heart disease (age of onset: 55) in his father; Mental illness in his sister; Migraines in his sister; Stroke in his father. Allergies  Allergen Reactions   Penicillins Other (See Comments)    Hands & feet peel. "childhood allergy"   Current Outpatient Medications on File Prior to Visit  Medication Sig Dispense Refill   Blood Pressure Monitoring (BLOOD PRESSURE CUFF) MISC 1 each by Does not apply route daily. 1 each 0   Blood Pressure Monitoring (OMRON 3 SERIES BP MONITOR) DEVI Use as directed daily to test blood pressure. 1 each 0   DULoxetine HCl 40 MG CPEP Take 1 capsule by mouth daily.     metoprolol succinate (TOPROL XL) 25 MG 24 hr tablet Take 1 tablet (25 mg total) by mouth daily. 90 tablet 1   sacubitril-valsartan (ENTRESTO) 24-26 MG Take 1 tablet by mouth 2 (two) times daily. 180 tablet 1   spironolactone (ALDACTONE) 25 MG tablet Take 0.5 tablets (12.5 mg total) by mouth daily. 45 tablet 1   tiZANidine (ZANAFLEX) 2 MG tablet TAKE 1 TABLET BY MOUTH EVERYDAY AT BEDTIME (Patient taking differently: Take 2 mg by mouth at bedtime as needed for muscle spasms.) 90 tablet 1  No current facility-administered medications on file prior to visit.        ROS:  All others reviewed and negative.  Objective        PE:  BP 120/82 (BP Location: Right Arm, Patient Position: Sitting, Cuff Size: Normal)   Pulse 65   Temp 97.7 F (36.5 C) (Oral)   Ht 6\' 3"  (1.905 m)   Wt 193 lb (87.5 kg)   SpO2 100%   BMI 24.12 kg/m                 Constitutional: Pt appears in NAD               HENT: Head: NCAT.                Right Ear: External ear normal.                 Left Ear: External ear normal.                Eyes: . Pupils are equal, round, and reactive to light. Conjunctivae and EOM are normal               Nose: without d/c or deformity               Neck: Neck supple. Gross normal ROM                Cardiovascular: Normal rate and regular rhythm.                 Pulmonary/Chest: Effort normal and breath sounds without rales or wheezing.                Abd:  Soft, NT, ND, + BS, no organomegaly               Neurological: Pt is alert. At baseline orientation, motor grossly intact               Skin: Skin is warm. No rashes, no other new lesions, LE edema - none               Psychiatric: Pt behavior is normal without agitation   Micro: none  Cardiac tracings I have personally interpreted today:  none  Pertinent Radiological findings (summarize): none   Lab Results  Component Value Date   WBC 5.3 07/26/2023   HGB 15.3 07/26/2023   HCT 45.5 07/26/2023   PLT 238.0 07/26/2023   GLUCOSE 166 (H) 12/28/2023   CHOL 242 (H) 11/02/2023   TRIG 79 11/02/2023   HDL 80 11/02/2023   LDLCALC 149 (H) 11/02/2023   ALT 16 07/26/2023   AST 16 07/26/2023   NA 141 12/28/2023   K 4.7 12/28/2023   CL 102 12/28/2023   CREATININE 1.02 12/28/2023   BUN 15 12/28/2023   CO2 24 12/28/2023   TSH 2.04 07/26/2023   PSA 1.25 08/17/2022   INR 1.01 06/26/2017   HGBA1C 6.3 07/26/2023   Assessment/Plan:  Jeffery [name] is a 63 y.o. White or Caucasian [1] male with  has a past medical history of Anemia, Central serous retinopathy, Depression, Former smoker, History of blood transfusion (04/17/2017), History of kidney stones, Hypertension, Left varicocele, Port-wine stain of face, and Pre-diabetes.  Primary hypertension BP Readings from Last 3 Encounters:  12/28/23 120/82  12/13/23 132/70  10/27/23 120/60   Stable, pt to continue medical treatment toprol xl 25 mg every day, entresto 24 26 mg qd   Major depression, chronic  Stable overall, declines need for change in tx or counseling referral  Prediabetes Lab Results  Component Value Date   HGBA1C 6.3 07/26/2023   Stable, pt to continue current medical treatment  - diet, wt control   Hematospermia D/w pt benign nature, for UA r/o  hematuria, consider urology if abnormal  Vitamin D deficiency Last vitamin D Lab Results  Component Value Date   VD25OH 36.2 05/18/2020   Low, to start oral replacement   Low back pain Chornic recurring, midline, without sciatica, cont current tx, hold imaging for now  Followup: Return if symptoms worsen or fail to improve.  Oliver Barre, MD 12/30/2023 9:51 PM [facility]

## 2023-12-28 NOTE — Patient Instructions (Signed)
 Please continue all other medications as before, and refills have been done if requested.  Please have the pharmacy call with any other refills you may need.  Please keep your appointments with your specialists as you may have planned  Please go to the LAB at the blood drawing area for the tests to be done - the urine testing  We can hold on xray for your back at this time

## 2023-12-29 LAB — BASIC METABOLIC PANEL WITH GFR
BUN/Creatinine Ratio: 15 (ref 10–24)
BUN: 15 mg/dL (ref 8–27)
CO2: 24 mmol/L (ref 20–29)
Calcium: 9.9 mg/dL (ref 8.6–10.2)
Chloride: 102 mmol/L (ref 96–106)
Creatinine, Ser: 1.02 mg/dL (ref 0.76–1.27)
Glucose: 166 mg/dL — ABNORMAL HIGH (ref 70–99)
Potassium: 4.7 mmol/L (ref 3.5–5.2)
Sodium: 141 mmol/L (ref 134–144)
eGFR: 84 mL/min/{1.73_m2} (ref >59)

## 2023-12-30 ENCOUNTER — Encounter: Payer: Self-pay | Admitting: Internal Medicine

## 2023-12-30 DIAGNOSIS — M545 Low back pain, unspecified: Secondary | ICD-10-CM | POA: Insufficient documentation

## 2023-12-30 DIAGNOSIS — E559 Vitamin D deficiency, unspecified: Secondary | ICD-10-CM | POA: Insufficient documentation

## 2023-12-30 NOTE — Assessment & Plan Note (Signed)
Stable overall, declines need for change in tx or counseling referral 

## 2023-12-30 NOTE — Assessment & Plan Note (Signed)
 Last vitamin D Lab Results  Component Value Date   VD25OH 36.2 05/18/2020   Low, to start oral replacement

## 2023-12-30 NOTE — Assessment & Plan Note (Signed)
 D/w pt benign nature, for UA r/o hematuria, consider urology if abnormal

## 2023-12-30 NOTE — Assessment & Plan Note (Signed)
 BP Readings from Last 3 Encounters:  12/28/23 120/82  12/13/23 132/70  10/27/23 120/60   Stable, pt to continue medical treatment toprol xl 25 mg every day, entresto 24 26 mg qd

## 2023-12-30 NOTE — Assessment & Plan Note (Signed)
 Chornic recurring, midline, without sciatica, cont current tx, hold imaging for now

## 2023-12-30 NOTE — Assessment & Plan Note (Signed)
Lab Results  Component Value Date   HGBA1C 6.3 07/26/2023   Stable, pt to continue current medical treatment  - diet, wt control

## 2024-01-10 ENCOUNTER — Other Ambulatory Visit: Payer: Self-pay

## 2024-01-10 ENCOUNTER — Telehealth: Payer: Self-pay | Admitting: Cardiology

## 2024-01-10 ENCOUNTER — Other Ambulatory Visit (HOSPITAL_BASED_OUTPATIENT_CLINIC_OR_DEPARTMENT_OTHER): Payer: Self-pay

## 2024-01-10 ENCOUNTER — Telehealth: Payer: Self-pay

## 2024-01-10 ENCOUNTER — Ambulatory Visit (HOSPITAL_COMMUNITY)

## 2024-01-10 ENCOUNTER — Other Ambulatory Visit (HOSPITAL_COMMUNITY): Payer: Self-pay

## 2024-01-10 DIAGNOSIS — I5020 Unspecified systolic (congestive) heart failure: Secondary | ICD-10-CM

## 2024-01-10 DIAGNOSIS — I493 Ventricular premature depolarization: Secondary | ICD-10-CM

## 2024-01-10 MED ORDER — ENTRESTO 24-26 MG PO TABS
1.0000 | ORAL_TABLET | Freq: Two times a day (BID) | ORAL | 3 refills | Status: DC
  Filled 2024-01-10: qty 60, 30d supply, fill #0
  Filled 2024-02-05: qty 60, 30d supply, fill #1
  Filled 2024-03-06 – 2024-04-02 (×4): qty 60, 30d supply, fill #2

## 2024-01-10 MED ORDER — METOPROLOL SUCCINATE ER 25 MG PO TB24
25.0000 mg | ORAL_TABLET | Freq: Every day | ORAL | 3 refills | Status: DC
  Filled 2024-01-10: qty 30, 30d supply, fill #0
  Filled 2024-02-05: qty 30, 30d supply, fill #1
  Filled 2024-03-06: qty 30, 30d supply, fill #2
  Filled 2024-04-02: qty 30, 30d supply, fill #3
  Filled 2024-04-30: qty 30, 30d supply, fill #4

## 2024-01-10 MED ORDER — SPIRONOLACTONE 25 MG PO TABS
12.5000 mg | ORAL_TABLET | Freq: Every day | ORAL | 3 refills | Status: AC
Start: 2024-01-10 — End: ?
  Filled 2024-01-10: qty 15, 30d supply, fill #0
  Filled 2024-02-05: qty 15, 30d supply, fill #1
  Filled 2024-03-06: qty 15, 30d supply, fill #2
  Filled 2024-04-02: qty 15, 30d supply, fill #3
  Filled 2024-04-30: qty 15, 30d supply, fill #4
  Filled 2024-06-05: qty 15, 30d supply, fill #5
  Filled 2024-07-01: qty 15, 30d supply, fill #6
  Filled 2024-08-01: qty 15, 30d supply, fill #7
  Filled 2024-08-30: qty 15, 30d supply, fill #8
  Filled 2024-10-07: qty 15, 30d supply, fill #9
  Filled 2024-11-07: qty 15, 30d supply, fill #10

## 2024-01-10 NOTE — Telephone Encounter (Signed)
 Enrolled in copay card for [facility]. RPH is adjusting claim in [place].

## 2024-01-10 NOTE — Telephone Encounter (Signed)
 Pt c/o medication issue:  1. Name of Medication:   sacubitril-valsartan (ENTRESTO) 24-26 MG   2. How are you currently taking this medication (dosage and times per day)?   As prescribed  3. Are you having a reaction (difficulty breathing--STAT)?   No  4. What is your medication issue?   Patient stated he has been on a program to get this medication at a lower cost.  Patient wants to get back on this program and wants advice on next steps.

## 2024-01-10 NOTE — Telephone Encounter (Signed)
*  STAT* If patient is at the pharmacy, call can be transferred to refill team.   1. Which medications need to be refilled? (please list name of each medication and dose if known)   spironolactone (ALDACTONE) 25 MG tablet  metoprolol succinate (TOPROL XL) 25 MG 24 hr tablet  sacubitril-valsartan (ENTRESTO) 24-26 MG   2. Would you like to learn more about the convenience, safety, & potential cost savings by using the [facility]?   3. Are you open to using the [facility] (Type [facility]. ).  4. Which pharmacy/location (including street and city if local pharmacy) is medication to be sent to?  [facility]   5. Do they need a 30 day or 90 day supply?   90 day  Patient stated he has is almost out of these medications.

## 2024-01-10 NOTE — Telephone Encounter (Signed)
 Patient has Nurse, learning disability. No assistance needed. Will have pharmacy run copay card

## 2024-01-16 ENCOUNTER — Other Ambulatory Visit: Payer: Self-pay | Admitting: Internal Medicine

## 2024-01-16 DIAGNOSIS — I10 Essential (primary) hypertension: Secondary | ICD-10-CM

## 2024-02-05 ENCOUNTER — Other Ambulatory Visit: Payer: Self-pay | Admitting: Internal Medicine

## 2024-02-05 ENCOUNTER — Other Ambulatory Visit (HOSPITAL_BASED_OUTPATIENT_CLINIC_OR_DEPARTMENT_OTHER): Payer: Self-pay

## 2024-02-05 MED ORDER — DULOXETINE HCL 40 MG PO CPEP
1.0000 | ORAL_CAPSULE | Freq: Every day | ORAL | 0 refills | Status: DC
  Filled 2024-02-05: qty 30, 30d supply, fill #0

## 2024-03-06 ENCOUNTER — Other Ambulatory Visit (HOSPITAL_BASED_OUTPATIENT_CLINIC_OR_DEPARTMENT_OTHER): Payer: Self-pay

## 2024-03-06 ENCOUNTER — Other Ambulatory Visit: Payer: Self-pay | Admitting: Internal Medicine

## 2024-03-07 ENCOUNTER — Other Ambulatory Visit: Payer: Self-pay

## 2024-03-07 ENCOUNTER — Other Ambulatory Visit (HOSPITAL_BASED_OUTPATIENT_CLINIC_OR_DEPARTMENT_OTHER): Payer: Self-pay

## 2024-03-11 ENCOUNTER — Other Ambulatory Visit (HOSPITAL_BASED_OUTPATIENT_CLINIC_OR_DEPARTMENT_OTHER): Payer: Self-pay

## 2024-03-11 MED ORDER — DULOXETINE HCL 40 MG PO CPEP
1.0000 | ORAL_CAPSULE | Freq: Every day | ORAL | 0 refills | Status: DC
  Filled 2024-03-11 – 2024-04-02 (×2): qty 30, 30d supply, fill #0

## 2024-03-12 ENCOUNTER — Other Ambulatory Visit (HOSPITAL_BASED_OUTPATIENT_CLINIC_OR_DEPARTMENT_OTHER): Payer: Self-pay

## 2024-03-13 ENCOUNTER — Ambulatory Visit (HOSPITAL_COMMUNITY)

## 2024-03-29 ENCOUNTER — Other Ambulatory Visit (HOSPITAL_COMMUNITY): Payer: Self-pay

## 2024-03-29 ENCOUNTER — Telehealth: Payer: Self-pay | Admitting: Pharmacy Technician

## 2024-03-29 ENCOUNTER — Telehealth: Payer: Self-pay | Admitting: Licensed Clinical Social Worker

## 2024-03-29 ENCOUNTER — Other Ambulatory Visit: Payer: Self-pay

## 2024-03-29 ENCOUNTER — Telehealth: Payer: Self-pay | Admitting: Cardiology

## 2024-03-29 DIAGNOSIS — Z79899 Other long term (current) drug therapy: Secondary | ICD-10-CM

## 2024-03-29 NOTE — Telephone Encounter (Signed)
 Pharmacy Patient Advocate Encounter  Insurance verification completed.   The patient is insured through no insurance- Banker claim for metoprolol  succinate (TOPROL  XL) 25 MG 24 hr tablet . Currently a quantity of 30 is a 30 day supply and the co-pay is $49.73 .   This test claim was processed through [facility]- copay amounts may vary at other pharmacies due to pharmacy/plan contracts, or as the patient moves through the different stages of their insurance plan.

## 2024-03-29 NOTE — Telephone Encounter (Signed)
 PAP: Patient assistance application for Entresto  through [employer] has been mailed to pt's home address on file.    Patient does not have insurance at this time

## 2024-03-29 NOTE — Telephone Encounter (Signed)
 Pt has lost his job and can not afford all his medications. I told him I could send over a message to Pt assistance and our social worker to see how they can help. Pt agreed.

## 2024-03-29 NOTE — Telephone Encounter (Signed)
 New encounter created for entresto  application mailed out

## 2024-03-29 NOTE — Telephone Encounter (Signed)
 Patient calling the office for samples of medication:   1.  What medication and dosage are you requesting samples for?  sacubitril -valsartan  (ENTRESTO ) 24-26 MG (7 tablets left) metoprolol  succinate (TOPROL  XL) 25 MG 24 hr tablet (about 20 tablets left) spironolactone  (ALDACTONE ) 25 MG tablet (almost out)  2.  Are you currently out of this medication?    Patient stated he recently lost his job/insurance and wants to get samples until he gets reinstated.

## 2024-03-29 NOTE — Progress Notes (Signed)
 Heart and Vascular Care Navigation  03/29/2024  GUISEPPE FLANAGAN [id number] [id number]  Reason for Referral: self pay, out of work Patient is participating in a Managed Medicaid Plan: No, self pay only  Engaged with patient by telephone for initial visit for Heart and Vascular Care Coordination.                                                                                                   Assessment:                 LCSW was able to reach pt at [phone]. Introduced self, role, reason for call. Confirmed home address, PCP, and previous BCBS coverage has termed. Pt shares he was employed full time but was terminated. 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 He hopes he will get job back by August. At this time he has been unemployed since April. Has been using savings to cover expenses but things are pricey and he feels he is running low on funds. LCSW discussed referral to DSS to complete a Medicaid application and courtesy SNAP application. Pt agreeable, will monitor for call from DSS caseworker. He is amendable to resources for any additional community assistance as available. LCSW will send via MyChart and mail as well. Otherwise no issues with transportation, resides alone, will also attempt to add emergency contact when I follow up. Will provide any updates as able.                         HRT/VAS Care Coordination     Patients Home Cardiology Office --  [facility]   Outpatient Care Team Social Worker   Social Worker Name: Marit Lark, [place], [phone]   Living arrangements for the past 2 months Apartment   Lives with: Self   Patient Current Insurance Coverage Self-Pay   Patient Has Concern With Paying Medical Bills Yes   Patient Concerns With Medical Bills lost employment and his coverage at that time   Medical Bill Referrals: Medicaid referral;  if not Medicaid eligible then may be eligible for  CAFA and NCMedAssist   Does Patient Have Prescription Coverage? No   Home Assistive Devices/Equipment Eyeglasses       Social History:                                                                             SDOH Screenings   Food Insecurity: Food Insecurity Present (03/29/2024)  Housing: Low Risk  (03/29/2024)  Transportation Needs: No Transportation Needs (03/29/2024)  Utilities: Not At Risk (03/29/2024)  Depression (PHQ2-9): Low Risk  (12/28/2023)  Financial Resource Strain: High Risk (03/29/2024)  Social Connections: Unknown (02/04/2022)   Received from [facility]  Tobacco Use: Medium Risk (12/30/2023)  Health Literacy: Adequate Health Literacy (03/29/2024)    SDOH Interventions: Financial Resources:  Financial Strain Interventions: [employer] Provided, Artist (referred for [employer]; will send rent/utility assistance and food resources) DSS for financial assistance and Editor, commissioning for [employer] Program  Food Insecurity:  Food Insecurity Interventions: [employer] Provided, Assist with [employer]  Housing Insecurity:  Housing Interventions: Community Resources Provided  Transportation:   Transportation Interventions: Intervention Not Indicated    Other Care Navigation Interventions:     Provided Pharmacy assistance resources  Discussed Medicaid referral; if Medicaid cannot be approved will discuss alternate options   Follow-up plan:   LCSW was able to send pt referral to [employer] and pt was approved for Medicaid. I have sent him that approval message and information about AR account if unable to pay anything up front for his medications. Sent additional supportive community resources to use for food and rent/utility assistance. Will f/u and ensure pt aware of approval and answer any additional questions.

## 2024-03-29 NOTE — Telephone Encounter (Signed)
 Left message for patient to call back

## 2024-03-29 NOTE — Telephone Encounter (Signed)
 Pharmacy Patient Advocate Encounter  Insurance verification completed.   The patient is insured through no insurance- Interior and spatial designer for spironolactone  25MG . Currently a quantity of 15 is a 30 day supply and the co-pay is $17.22 .   This test claim was processed through [facility]- copay amounts may vary at other pharmacies due to pharmacy/plan contracts, or as the patient moves through the different stages of their insurance plan.

## 2024-03-29 NOTE — Telephone Encounter (Signed)
 Pt calling back

## 2024-04-01 ENCOUNTER — Telehealth (HOSPITAL_BASED_OUTPATIENT_CLINIC_OR_DEPARTMENT_OTHER): Payer: Self-pay | Admitting: Licensed Clinical Social Worker

## 2024-04-01 ENCOUNTER — Telehealth: Payer: Self-pay

## 2024-04-01 ENCOUNTER — Other Ambulatory Visit (HOSPITAL_COMMUNITY): Payer: Self-pay

## 2024-04-01 NOTE — Telephone Encounter (Signed)
 H&V Care Navigation CSW Progress Note  Clinical Social Worker checked NCTracks to verify pt benefits have resulted there and are active in the system today. Routed coverage to Prior Auth team in case it is needed for his Entresto .  Patient is participating in a Managed Medicaid Plan:  Yes- [employer]  SDOH Screenings   Food Insecurity: Food Insecurity Present (03/29/2024)  Housing: Low Risk  (03/29/2024)  Transportation Needs: No Transportation Needs (03/29/2024)  Utilities: Not At Risk (03/29/2024)  Depression (PHQ2-9): Low Risk  (12/28/2023)  Financial Resource Strain: High Risk (03/29/2024)  Social Connections: Unknown (02/04/2022)   Received from [facility]  Tobacco Use: Medium Risk (12/30/2023)  Health Literacy: Adequate Health Literacy (03/29/2024)    Marit Lark, MSW, LCSW Clinical Social Worker II [facility] Heart/Vascular Care Navigation  [phone]- work cell phone (preferred)

## 2024-04-01 NOTE — Telephone Encounter (Signed)
 Good to go! $4 copay

## 2024-04-01 NOTE — Telephone Encounter (Signed)
 Pharmacy Patient Advocate Encounter  Insurance verification completed.   The patient is insured through [facility]   Ran test claim for ENTRESTO . Currently a quantity of 60 is a 30 day supply and the co-pay is $4 . The current 30 day co-pay is, $4.  No PA needed at this time.  This test claim was processed through [facility]- copay amounts may vary at other pharmacies due to pharmacy/plan contracts, or as the patient moves through the different stages of their insurance plan.   PLAN ADDED TO ALAN

## 2024-04-02 ENCOUNTER — Telehealth: Payer: Self-pay | Admitting: Licensed Clinical Social Worker

## 2024-04-02 ENCOUNTER — Telehealth (HOSPITAL_COMMUNITY): Payer: Self-pay

## 2024-04-02 ENCOUNTER — Other Ambulatory Visit: Payer: Self-pay

## 2024-04-02 ENCOUNTER — Other Ambulatory Visit (HOSPITAL_COMMUNITY): Payer: Self-pay

## 2024-04-02 ENCOUNTER — Telehealth (HOSPITAL_COMMUNITY): Payer: Self-pay | Admitting: Pharmacy Technician

## 2024-04-02 ENCOUNTER — Other Ambulatory Visit (HOSPITAL_BASED_OUTPATIENT_CLINIC_OR_DEPARTMENT_OTHER): Payer: Self-pay

## 2024-04-02 NOTE — Telephone Encounter (Signed)
 Pharmacy Patient Advocate Encounter   Received notification from Pt Calls Messages that prior authorization for DULoxetine  HCl 40MG  dr capsules  is required/requested.   Insurance verification completed.   The patient is insured through [facility] .   Per test claim: PA required; PA submitted to above mentioned insurance via CoverMyMeds Key/confirmation #/EOC [id number] Status is pending

## 2024-04-02 NOTE — Telephone Encounter (Signed)
 H&V Care Navigation CSW Progress Note  Clinical Social Worker contacted patient by phone to f/u on Medicaid approval and some f/u logistics. Was able to leave a voicemail at [phone] which pt returned. Pt aware he was approved for Medicaid. Discussed Entresto  and other medications should be $4 per prior auth, and if any issues with those co-pays can set up an AR account at [facility] pharmacy. Discussed also that I had routed new coverage to prior auth team for upcoming echocardiogram in case they needed to process that. Encouraged him to bring his card once received to all upcoming appts, if he receives any bills that look like they were not processed through his Medicaid in the month of June to call and ask them to rebill under his Medicaid benefits. LCSW remains available as needed for any questions/resources moving forward. No additional questions today.   Patient is participating in a Managed Medicaid Plan:  Yes- healthy blue  SDOH Screenings   Food Insecurity: Food Insecurity Present (03/29/2024)  Housing: Low Risk  (03/29/2024)  Transportation Needs: No Transportation Needs (03/29/2024)  Utilities: Not At Risk (03/29/2024)  Depression (PHQ2-9): Low Risk  (12/28/2023)  Financial Resource Strain: High Risk (03/29/2024)  Social Connections: Unknown (02/04/2022)   Received from [facility]  Tobacco Use: Medium Risk (12/30/2023)  Health Literacy: Adequate Health Literacy (03/29/2024)    Marit Lark, MSW, LCSW Clinical Social Worker II [facility] Health Heart/Vascular Care Navigation  [phone]- work cell phone (preferred)

## 2024-04-03 ENCOUNTER — Other Ambulatory Visit (HOSPITAL_BASED_OUTPATIENT_CLINIC_OR_DEPARTMENT_OTHER): Payer: Self-pay

## 2024-04-03 NOTE — Telephone Encounter (Signed)
 Pharmacy Patient Advocate Encounter  Received notification from [facility] that Prior Authorization for DULoxetine  HCl 40MG  dr capsules  has been DENIED.  See denial reason below. No denial letter attached in CMM. Will attach denial letter to Media tab once received.   PA #/Case ID/Reference #: [id number]  *per test claim the insurance will pay for 2 capsules of the 20 mg daily with no prior auth required.

## 2024-04-04 ENCOUNTER — Other Ambulatory Visit (HOSPITAL_BASED_OUTPATIENT_CLINIC_OR_DEPARTMENT_OTHER): Payer: Self-pay

## 2024-04-04 ENCOUNTER — Telehealth: Payer: Self-pay | Admitting: Internal Medicine

## 2024-04-04 NOTE — Telephone Encounter (Signed)
 Copied from CRM [id number]. Topic: General - Call Back - No Documentation >> Apr 04, 2024 11:58 AM Berneda FALCON wrote: Reason for CRM: Pt returning phone call to J[employer] about his medications. I see a note in there but I am unable to assist in relaying this message as I am not sure what to relay.  Please call patient back at [phone]

## 2024-04-04 NOTE — Telephone Encounter (Unsigned)
 Copied from CRM [id number]. Topic: Clinical - Medication Question >> Apr 04, 2024  2:45 PM Tiffini S wrote: Reason for CRM: Patient has new insurance with [facility] and he is asking for a call back with questions about prior authorization for medication denied by insurnace. Please call back at [phone].

## 2024-04-04 NOTE — Telephone Encounter (Signed)
 Please advise if this medication change is appropriate.

## 2024-04-08 ENCOUNTER — Other Ambulatory Visit (HOSPITAL_BASED_OUTPATIENT_CLINIC_OR_DEPARTMENT_OTHER): Payer: Self-pay

## 2024-04-08 ENCOUNTER — Other Ambulatory Visit (HOSPITAL_COMMUNITY): Payer: Self-pay

## 2024-04-08 NOTE — Telephone Encounter (Signed)
 Patient called and he got application. He will fill out and mail back in to us 

## 2024-04-08 NOTE — Telephone Encounter (Signed)
 Pt now has Medicaid! 03/03/24 - 03/02/25 and Medicaid ID# is [id number] N    Per another encounter   closing

## 2024-04-08 NOTE — Telephone Encounter (Signed)
 Hi, the Patient called asking about one of his cardiology meds and then mentioned this. I ran a test claim for duloxetine  20mg  at 2 capsules daily and it went through for 4.00.  can the patient be changed to that?

## 2024-04-09 ENCOUNTER — Other Ambulatory Visit (HOSPITAL_BASED_OUTPATIENT_CLINIC_OR_DEPARTMENT_OTHER): Payer: Self-pay

## 2024-04-10 ENCOUNTER — Other Ambulatory Visit (HOSPITAL_BASED_OUTPATIENT_CLINIC_OR_DEPARTMENT_OTHER): Payer: Self-pay

## 2024-04-11 ENCOUNTER — Other Ambulatory Visit (HOSPITAL_BASED_OUTPATIENT_CLINIC_OR_DEPARTMENT_OTHER): Payer: Self-pay

## 2024-04-12 ENCOUNTER — Other Ambulatory Visit (HOSPITAL_BASED_OUTPATIENT_CLINIC_OR_DEPARTMENT_OTHER): Payer: Self-pay

## 2024-04-12 ENCOUNTER — Other Ambulatory Visit: Payer: Self-pay

## 2024-04-12 ENCOUNTER — Telehealth: Payer: Self-pay | Admitting: Internal Medicine

## 2024-04-12 NOTE — Telephone Encounter (Signed)
 Unable to reach patient. LMTRC

## 2024-04-12 NOTE — Telephone Encounter (Signed)
 This medication is being prescribed by [facility] and that's how they have it prescribed.

## 2024-04-12 NOTE — Telephone Encounter (Unsigned)
 Copied from CRM [id number]. Topic: Clinical - Medication Question >> Apr 12, 2024  2:14 PM Geroldine GRADE wrote: Reason for CRM: Patient calling back J[employer]unique about  his medication

## 2024-04-12 NOTE — Telephone Encounter (Signed)
 Duplicate message being handled in another telephone call.

## 2024-04-12 NOTE — Telephone Encounter (Signed)
 Medication refill request has been sent to Dr. Yetta Barre

## 2024-04-12 NOTE — Telephone Encounter (Signed)
 Unable to reach patient after he returned my call and I missed him. LMTRC

## 2024-04-14 ENCOUNTER — Other Ambulatory Visit: Payer: Self-pay | Admitting: Internal Medicine

## 2024-04-14 DIAGNOSIS — F329 Major depressive disorder, single episode, unspecified: Secondary | ICD-10-CM

## 2024-04-14 MED ORDER — FLUOXETINE HCL 20 MG PO TABS: 40.0000 mg | ORAL_TABLET | Freq: Every day | ORAL | 0 refills | Status: DC

## 2024-04-16 ENCOUNTER — Other Ambulatory Visit (HOSPITAL_BASED_OUTPATIENT_CLINIC_OR_DEPARTMENT_OTHER): Payer: Self-pay

## 2024-04-17 ENCOUNTER — Other Ambulatory Visit: Payer: Self-pay | Admitting: Internal Medicine

## 2024-04-17 DIAGNOSIS — F329 Major depressive disorder, single episode, unspecified: Secondary | ICD-10-CM

## 2024-04-17 MED ORDER — DULOXETINE HCL 20 MG PO CPEP: 40.0000 mg | ORAL_CAPSULE | Freq: Every day | ORAL | 0 refills | Status: AC

## 2024-04-17 NOTE — Telephone Encounter (Signed)
 Patient states that he feels like he wants to continue taking duloxetine  20mg  2 capsules daily he feels that his medication shouldn't be changed.

## 2024-04-25 ENCOUNTER — Ambulatory Visit: Payer: Self-pay | Admitting: Nurse Practitioner

## 2024-04-25 ENCOUNTER — Ambulatory Visit (HOSPITAL_COMMUNITY)
Admission: RE | Admit: 2024-04-25 | Discharge: 2024-04-25 | Disposition: A | Discharge status: Home / Self Care | Source: Ambulatory Visit | Attending: Nurse Practitioner | Admitting: Nurse Practitioner

## 2024-04-25 DIAGNOSIS — I493 Ventricular premature depolarization: Secondary | ICD-10-CM | POA: Insufficient documentation

## 2024-04-25 DIAGNOSIS — I428 Other cardiomyopathies: Secondary | ICD-10-CM | POA: Diagnosis not present

## 2024-04-25 DIAGNOSIS — E782 Mixed hyperlipidemia: Secondary | ICD-10-CM | POA: Insufficient documentation

## 2024-04-25 DIAGNOSIS — I5020 Unspecified systolic (congestive) heart failure: Secondary | ICD-10-CM | POA: Diagnosis not present

## 2024-04-25 DIAGNOSIS — I10 Essential (primary) hypertension: Secondary | ICD-10-CM | POA: Diagnosis not present

## 2024-04-25 LAB — ECHOCARDIOGRAM COMPLETE
Area-P 1/2: 3.65 cm2
S' Lateral: 4 cm

## 2024-04-26 ENCOUNTER — Encounter: Payer: Self-pay | Admitting: Cardiology

## 2024-04-26 ENCOUNTER — Telehealth: Payer: Self-pay | Admitting: Cardiology

## 2024-04-26 DIAGNOSIS — I493 Ventricular premature depolarization: Secondary | ICD-10-CM

## 2024-04-26 DIAGNOSIS — I5020 Unspecified systolic (congestive) heart failure: Secondary | ICD-10-CM

## 2024-04-26 NOTE — Telephone Encounter (Signed)
 Patient identification verified by 2 forms.   Called and spoke to patient  Patient states:  -Pt has been taking Entresto  twice daily as prescribed.  -Pt has questions causes of PVCs -Pt does drink about 16.9 ounces of coffee daily -Pt informed caffeine and lack of sleep can cause PVCs  Patient denies:  -Being under stress  Interventions/Plan: -Will get message to [name] to review.   Patient agrees with plan, no questions at this time

## 2024-04-26 NOTE — Telephone Encounter (Signed)
 Returning call.

## 2024-04-26 NOTE — Telephone Encounter (Signed)
 error

## 2024-04-30 ENCOUNTER — Ambulatory Visit: Attending: Nurse Practitioner

## 2024-04-30 ENCOUNTER — Other Ambulatory Visit (HOSPITAL_BASED_OUTPATIENT_CLINIC_OR_DEPARTMENT_OTHER): Payer: Self-pay

## 2024-04-30 DIAGNOSIS — I493 Ventricular premature depolarization: Secondary | ICD-10-CM

## 2024-04-30 DIAGNOSIS — I5020 Unspecified systolic (congestive) heart failure: Secondary | ICD-10-CM

## 2024-04-30 MED ORDER — SACUBITRIL-VALSARTAN 49-51 MG PO TABS: 1.0000 | ORAL_TABLET | Freq: Two times a day (BID) | ORAL | 2 refills | Status: DC

## 2024-04-30 NOTE — Telephone Encounter (Signed)
 Left patient a detailed message, ok per DPR, with results. Advised to call back with any questions or concerns.  Labs ordered.  7 day zio ordered.

## 2024-04-30 NOTE — Progress Notes (Unsigned)
Enrolled for [employer] to mail a ZIO XT long term holter monitor to the patients address on file.   Dr. Rosemary Holms to read.

## 2024-05-01 ENCOUNTER — Other Ambulatory Visit (HOSPITAL_BASED_OUTPATIENT_CLINIC_OR_DEPARTMENT_OTHER): Payer: Self-pay

## 2024-05-01 ENCOUNTER — Other Ambulatory Visit: Payer: Self-pay | Admitting: Cardiology

## 2024-05-01 DIAGNOSIS — I5020 Unspecified systolic (congestive) heart failure: Secondary | ICD-10-CM

## 2024-05-01 DIAGNOSIS — I493 Ventricular premature depolarization: Secondary | ICD-10-CM

## 2024-05-02 DIAGNOSIS — F419 Anxiety disorder, unspecified: Secondary | ICD-10-CM | POA: Diagnosis not present

## 2024-05-03 ENCOUNTER — Telehealth: Payer: Self-pay | Admitting: Cardiology

## 2024-05-03 ENCOUNTER — Other Ambulatory Visit (HOSPITAL_BASED_OUTPATIENT_CLINIC_OR_DEPARTMENT_OTHER): Payer: Self-pay

## 2024-05-03 ENCOUNTER — Encounter (HOSPITAL_BASED_OUTPATIENT_CLINIC_OR_DEPARTMENT_OTHER): Payer: Self-pay

## 2024-05-03 MED ORDER — SACUBITRIL-VALSARTAN 49-51 MG PO TABS
1.0000 | ORAL_TABLET | Freq: Two times a day (BID) | ORAL | 2 refills | Status: DC
  Filled 2024-05-03: qty 60, 30d supply, fill #0
  Filled 2024-05-31: qty 60, 30d supply, fill #1
  Filled 2024-07-01: qty 60, 30d supply, fill #2

## 2024-05-03 NOTE — Telephone Encounter (Signed)
*  STAT* If patient is at the pharmacy, call can be transferred to refill team.   1. Which medications need to be refilled? (please list name of each medication and dose if known)   sacubitril -valsartan  (ENTRESTO ) 49-51 MG    2. Which pharmacy/location (including street and city if local pharmacy) is medication to be sent to? [facility] Phone: [phone]  Fax: [phone]     3. Do they need a 30 day or 90 day supply? [facility] [place]  Pt has 2 pills left

## 2024-05-03 NOTE — Telephone Encounter (Signed)
 Sent to wrong pharmacy, resent to [facility] [place] per pt's request

## 2024-05-06 ENCOUNTER — Other Ambulatory Visit (HOSPITAL_BASED_OUTPATIENT_CLINIC_OR_DEPARTMENT_OTHER): Payer: Self-pay

## 2024-05-09 ENCOUNTER — Telehealth: Payer: Self-pay | Admitting: Pharmacy Technician

## 2024-05-09 ENCOUNTER — Other Ambulatory Visit (HOSPITAL_COMMUNITY): Payer: Self-pay

## 2024-05-09 NOTE — Telephone Encounter (Signed)
 Pharmacy Patient Advocate Encounter   Received notification from CoverMyMeds that prior authorization for FLUoxetine  HCl 20MG  tablets is required/requested.   Insurance verification completed.   The patient is insured through [facility] .  Action: Medication has been discontinued. Archived Key: [id number]

## 2024-05-13 NOTE — Telephone Encounter (Signed)
 Left the patient another message asking if he has gotten my messages? I asked the patient if he has started the new dosage of Entresto , got labs or even got his monitor. Advised a call back or send a mychart message with his responses.   Mychart message sent to the patient as well with the same questions that was left on the patients voicemail.

## 2024-05-22 ENCOUNTER — Other Ambulatory Visit (HOSPITAL_COMMUNITY): Payer: Self-pay

## 2024-05-22 DIAGNOSIS — I428 Other cardiomyopathies: Secondary | ICD-10-CM | POA: Diagnosis not present

## 2024-05-22 DIAGNOSIS — I10 Essential (primary) hypertension: Secondary | ICD-10-CM | POA: Diagnosis not present

## 2024-05-22 DIAGNOSIS — I493 Ventricular premature depolarization: Secondary | ICD-10-CM | POA: Diagnosis not present

## 2024-05-22 DIAGNOSIS — E782 Mixed hyperlipidemia: Secondary | ICD-10-CM | POA: Diagnosis not present

## 2024-05-22 DIAGNOSIS — I5020 Unspecified systolic (congestive) heart failure: Secondary | ICD-10-CM | POA: Diagnosis not present

## 2024-05-23 ENCOUNTER — Other Ambulatory Visit: Payer: Self-pay

## 2024-05-23 ENCOUNTER — Other Ambulatory Visit (HOSPITAL_BASED_OUTPATIENT_CLINIC_OR_DEPARTMENT_OTHER): Payer: Self-pay

## 2024-05-23 DIAGNOSIS — I5020 Unspecified systolic (congestive) heart failure: Secondary | ICD-10-CM

## 2024-05-23 DIAGNOSIS — I493 Ventricular premature depolarization: Secondary | ICD-10-CM

## 2024-05-23 DIAGNOSIS — E782 Mixed hyperlipidemia: Secondary | ICD-10-CM

## 2024-05-23 LAB — HEPATIC FUNCTION PANEL
ALT: 14 IU/L (ref 0–44)
AST: 18 IU/L (ref 0–40)
Albumin: 4.8 g/dL (ref 3.9–4.9)
Alkaline Phosphatase: 104 IU/L (ref 44–121)
Bilirubin Total: 0.6 mg/dL (ref 0.0–1.2)
Bilirubin, Direct: 0.22 mg/dL (ref 0.00–0.40)
Total Protein: 6.9 g/dL (ref 6.0–8.5)

## 2024-05-23 LAB — LIPID PANEL
Chol/HDL Ratio: 3 ratio (ref 0.0–5.0)
Cholesterol, Total: 213 mg/dL — ABNORMAL HIGH (ref 100–199)
HDL: 71 mg/dL (ref >39)
LDL Chol Calc (NIH): 127 mg/dL — ABNORMAL HIGH (ref 0–99)
Triglycerides: 86 mg/dL (ref 0–149)
VLDL Cholesterol Cal: 15 mg/dL (ref 5–40)

## 2024-05-23 MED ORDER — ROSUVASTATIN CALCIUM 10 MG PO TABS
10.0000 mg | ORAL_TABLET | Freq: Every day | ORAL | 1 refills | Status: AC
  Filled 2024-05-23: qty 90, 90d supply, fill #0

## 2024-05-24 DIAGNOSIS — I493 Ventricular premature depolarization: Secondary | ICD-10-CM

## 2024-05-24 DIAGNOSIS — I5020 Unspecified systolic (congestive) heart failure: Secondary | ICD-10-CM | POA: Diagnosis not present

## 2024-05-26 ENCOUNTER — Ambulatory Visit: Payer: Self-pay | Admitting: Nurse Practitioner

## 2024-05-26 DIAGNOSIS — I493 Ventricular premature depolarization: Secondary | ICD-10-CM

## 2024-05-26 DIAGNOSIS — I5020 Unspecified systolic (congestive) heart failure: Secondary | ICD-10-CM

## 2024-05-27 ENCOUNTER — Other Ambulatory Visit (HOSPITAL_BASED_OUTPATIENT_CLINIC_OR_DEPARTMENT_OTHER): Payer: Self-pay

## 2024-05-27 MED ORDER — METOPROLOL SUCCINATE ER 25 MG PO TB24
25.0000 mg | ORAL_TABLET | Freq: Two times a day (BID) | ORAL | 3 refills | Status: DC
Start: 2024-05-27 — End: 2024-07-16
  Filled 2024-05-27: qty 60, 30d supply, fill #0
  Filled 2024-07-01: qty 60, 30d supply, fill #1

## 2024-05-31 ENCOUNTER — Other Ambulatory Visit (HOSPITAL_BASED_OUTPATIENT_CLINIC_OR_DEPARTMENT_OTHER): Payer: Self-pay

## 2024-06-05 ENCOUNTER — Other Ambulatory Visit: Payer: Self-pay

## 2024-06-19 ENCOUNTER — Telehealth: Payer: Self-pay | Admitting: Licensed Clinical Social Worker

## 2024-06-19 NOTE — Telephone Encounter (Signed)
 H&V Care Navigation CSW Progress Note  Clinical Social Worker received a call from pt to share that he has started back at his job and re-enrolled in employer benefits. He wanted to report his income change but wasn't sure who to contact. Tinnie Devonshire had assisted pt with [place] DSS but she has since transitioned to a new role at DSS. I will reach out to her teammates to see who he should contact at DSS. No additional questions at this time will f/u with pt to let him know outcome.  Patient is participating in a Managed Medicaid Plan:  Yes- [employer], however has returned to work and benefits with his job so also has Careers information officer Insecurity: Food Insecurity Present (03/29/2024)  Housing: Low Risk  (03/29/2024)  Transportation Needs: No Transportation Needs (03/29/2024)  Utilities: Not At Risk (03/29/2024)  Depression (PHQ2-9): Low Risk  (12/28/2023)  Financial Resource Strain: High Risk (03/29/2024)  Social Connections: Unknown (02/04/2022)   Received from [facility]  Tobacco Use: Medium Risk (12/30/2023)  Health Literacy: Adequate Health Literacy (03/29/2024)    Marit Lark, MSW, LCSW Clinical Social Worker II [facility] Health Heart/Vascular Care Navigation  [phone]- work cell phone (preferred)

## 2024-06-27 ENCOUNTER — Other Ambulatory Visit (HOSPITAL_BASED_OUTPATIENT_CLINIC_OR_DEPARTMENT_OTHER): Payer: Self-pay

## 2024-07-01 ENCOUNTER — Other Ambulatory Visit (HOSPITAL_BASED_OUTPATIENT_CLINIC_OR_DEPARTMENT_OTHER): Payer: Self-pay

## 2024-07-11 LAB — LIPID PANEL
Chol/HDL Ratio: 3 ratio (ref 0.0–5.0)
Cholesterol, Total: 208 mg/dL — ABNORMAL HIGH (ref 100–199)
HDL: 70 mg/dL (ref >39)
LDL Chol Calc (NIH): 128 mg/dL — ABNORMAL HIGH (ref 0–99)
Triglycerides: 57 mg/dL (ref 0–149)
VLDL Cholesterol Cal: 10 mg/dL (ref 5–40)

## 2024-07-11 LAB — BASIC METABOLIC PANEL WITH GFR
BUN/Creatinine Ratio: 15 (ref 10–24)
BUN: 15 mg/dL (ref 8–27)
CO2: 23 mmol/L (ref 20–29)
Calcium: 9.8 mg/dL (ref 8.6–10.2)
Chloride: 100 mmol/L (ref 96–106)
Creatinine, Ser: 0.99 mg/dL (ref 0.76–1.27)
Glucose: 92 mg/dL (ref 70–99)
Potassium: 4.7 mmol/L (ref 3.5–5.2)
Sodium: 138 mmol/L (ref 134–144)
eGFR: 87 mL/min/1.73 (ref >59)

## 2024-07-11 LAB — HEPATIC FUNCTION PANEL
ALT: 15 IU/L (ref 0–44)
AST: 16 IU/L (ref 0–40)
Albumin: 4.6 g/dL (ref 3.9–4.9)
Alkaline Phosphatase: 116 IU/L (ref 47–123)
Bilirubin Total: 0.7 mg/dL (ref 0.0–1.2)
Bilirubin, Direct: 0.2 mg/dL (ref 0.00–0.40)
Total Protein: 7 g/dL (ref 6.0–8.5)

## 2024-07-14 ENCOUNTER — Other Ambulatory Visit: Payer: Self-pay | Admitting: Internal Medicine

## 2024-07-14 DIAGNOSIS — F329 Major depressive disorder, single episode, unspecified: Secondary | ICD-10-CM

## 2024-07-16 ENCOUNTER — Encounter: Payer: Self-pay | Admitting: Cardiovascular Disease

## 2024-07-16 ENCOUNTER — Other Ambulatory Visit (HOSPITAL_BASED_OUTPATIENT_CLINIC_OR_DEPARTMENT_OTHER): Payer: Self-pay

## 2024-07-16 ENCOUNTER — Ambulatory Visit: Attending: Cardiovascular Disease | Admitting: Cardiovascular Disease

## 2024-07-16 VITALS — BP 120/78 | HR 77 | Ht 75.0 in | Wt 197.0 lb

## 2024-07-16 DIAGNOSIS — I5020 Unspecified systolic (congestive) heart failure: Secondary | ICD-10-CM

## 2024-07-16 DIAGNOSIS — I4719 Other supraventricular tachycardia: Secondary | ICD-10-CM | POA: Diagnosis not present

## 2024-07-16 DIAGNOSIS — I4729 Other ventricular tachycardia: Secondary | ICD-10-CM | POA: Diagnosis not present

## 2024-07-16 MED ORDER — PROPRANOLOL HCL ER 80 MG PO CP24
80.0000 mg | ORAL_CAPSULE | Freq: Every day | ORAL | 1 refills | Status: AC
  Filled 2024-07-16: qty 90, 90d supply, fill #0
  Filled 2024-08-30: qty 90, 90d supply, fill #1
  Filled 2024-10-07: qty 30, 30d supply, fill #1
  Filled 2024-11-07: qty 30, 30d supply, fill #2

## 2024-07-16 MED ORDER — DULOXETINE HCL 40 MG PO CPEP
40.0000 mg | ORAL_CAPSULE | Freq: Every morning | ORAL | 0 refills | Status: DC
  Filled 2024-07-16 – 2024-07-17 (×2): qty 90, 90d supply, fill #0
  Filled 2024-07-17: qty 30, 30d supply, fill #0
  Filled 2024-08-12: qty 30, 30d supply, fill #1
  Filled 2024-08-30 – 2024-09-04 (×3): qty 30, 30d supply, fill #2

## 2024-07-16 NOTE — Patient Instructions (Signed)
 Medication Instructions: Your physician has recommended you make the following change in your medication:   ** Stop Metoprolol   ** Begin Propanolol LA 80mg  - 1 capsule by mouth daily  if you need a refill on your cardiac medications before your next appointment, please call your pharmacy*  Lab Work: None ordered.  If you have labs (blood work) drawn today and your tests are completely normal, you will receive your results only by: MyChart Message (if you have MyChart) OR A paper copy in the mail If you have any lab test that is abnormal or we need to change your treatment, we will call you to review the results.  Testing/Procedures: in 2 months Your physician has requested that you have an echocardiogram. Echocardiography is a painless test that uses sound waves to create images of your heart. It provides your doctor with information about the size and shape of your heart and how well your heart's chambers and valves are working. This procedure takes approximately one hour. There are no restrictions for this procedure. Please do NOT wear cologne, perfume, aftershave, or lotions (deodorant is allowed). Please arrive 15 minutes prior to your appointment time.  Please note: We ask at that you not bring children with you during ultrasound (echo/ vascular) testing. Due to room size and safety concerns, children are not allowed in the ultrasound rooms during exams. Our front office staff cannot provide observation of children in our lobby area while testing is being conducted. An adult accompanying a patient to their appointment will only be allowed in the ultrasound room at the discretion of the ultrasound technician under special circumstances. We apologize for any inconvenience.   Follow-Up: At [facility], you and your health needs are our priority.  As part of our continuing mission to provide you with exceptional heart care, our providers are all part of one team.  This team  includes your primary Cardiologist (physician) and Advanced Practice Providers or APPs (Physician Assistants and Nurse Practitioners) who all work together to provide you with the care you need, when you need it.  Your next appointment:   3 months with Dr Mealor

## 2024-07-16 NOTE — Progress Notes (Signed)
 Electrophysiology Office Note:    Date:  07/16/2024   ID:  BREVIN MCFADDEN, DOB 1961-01-19, MRN [id number]  PCP:  Joshua Debby CROME, MD   [facility] Providers Cardiologist:  None     Referring MD: Wyn [name] VEAR Mickey., NP   History of Present Illness:    Jeffery [name] is a 63 y.o. male with a medical history significant for atrial tachycardia, PVCs and NSVT, CHFmrEF, referred for arrhythmia management.     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  A Lexiscan  nuclear stress did not show any evidence of ischemia.  Monitor was placed in December 2024 that showed 18% burden of PVC as well as brief episodes of nonsustained VT and SVT.  Discussed the use of AI scribe software for clinical note transcription with the patient, who gave verbal consent to proceed.  History of Present Illness  He was diagnosed with frequent PVCs and wore a monitor in January 2025 that showed an 18% burden of PVCs.  He experiences occasional sensations described as 'a little light prick like an electrical charge,' but otherwise is asymptomatic.  He associates the decline in his PVCs with change in his work habits.  When he initially came to medical attention, he was very stressed at work; he had confrontations with a coworker and was actually fired from his job for short period.  EF at the time of his initial evaluation was 40 to 45%.  He experiences shortness of breath during exertion, such as cycling, and finds it difficult to climb hills that he previously managed with ease.   Repeat echo was performed in July 2025 that showed stable EF of 40 to 45%.  Monitor in August showed improvement in PVC burden to approximately 10%.  He is currently taking metoprolol .  He consumes one cup of caffeine daily and drinks up to six beers occasionally. He does not use marijuana or other drugs. He has no unexplained loss of consciousness.         Today, he reports  he feels well and has no complaints  EKGs/Labs/Other Studies Reviewed Today:     Echocardiogram:  TTE April 25, 2024 LVEF 40 to 45%.  Grade 1 diastolic dysfunction.  Mildly enlarged RV.  Mild MR.   Monitors:  6 day monitor May 07, 2024-- my interpretation Sinus rhythm 45 to 119 bpm, average 74 bpm PACs 1.5%, 10.9% PVCs 8 episodes of SVT reported with an average rate of 143 bpm.  Longest duration 4.9 seconds. No symptoms reported.  Stress testing:  Pharmacological stress February 2025 Apical abnormality attributed to diaphragmatic attenuation   EKG:   EKG Interpretation Date/Time:  Tuesday July 16 2024 13:51:00 EDT Ventricular Rate:  77 PR Interval:  210 QRS Duration:  86 QT Interval:  352 QTC Calculation: 398 R Axis:   19  Text Interpretation: Sinus rhythm with 1st degree A-V block When compared with ECG of 13-Dec-2023 11:11, No significant change was found Confirmed by [name] [phone]) on 07/16/2024 2:05:18 PM     Physical Exam:    VS:  BP 120/78 (BP Location: Right Arm, Patient Position: Sitting, Cuff Size: Large)   Pulse 77   Ht 6' 3 (1.905 m)   Wt 197 lb (89.4 kg)   SpO2 97%   BMI 24.62 kg/m     Wt Readings from Last 3 Encounters:  07/16/24 197 lb (89.4 kg)  12/28/23 193 lb (87.5 kg)  12/13/23 193 lb (87.5 kg)  GEN: Well nourished, well developed in no acute distress CARDIAC: RRR, no murmurs, rubs, gallops RESPIRATORY:  Normal work of breathing MUSCULOSKELETAL: no edema    ASSESSMENT & PLAN:     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% on serial echocardiogram Most recent PVC burden 10%, unlikely to be the source of his cardiomyopathy at this rate Will recheck EF in about 2 months If EF does not continue to improve, will consider cardiac MRI  Frequent PVCs 18% burden in December 2024, improved to 10% burden in August 2025 Occasional runs of nonsustained VT No personal history of unexplained syncope or near syncope May be exacerbated by  stress Was switch from metoprolol  to propranolol LA 80 mg  Atrial ectopy Sometimes occurring in brief runs Minimal symptoms      Signed, [name], MD  07/16/2024 2:40 PM    [facility]

## 2024-07-17 ENCOUNTER — Other Ambulatory Visit (HOSPITAL_BASED_OUTPATIENT_CLINIC_OR_DEPARTMENT_OTHER): Payer: Self-pay

## 2024-07-17 ENCOUNTER — Other Ambulatory Visit: Payer: Self-pay

## 2024-08-01 ENCOUNTER — Other Ambulatory Visit (HOSPITAL_BASED_OUTPATIENT_CLINIC_OR_DEPARTMENT_OTHER): Payer: Self-pay

## 2024-08-02 ENCOUNTER — Telehealth: Payer: Self-pay | Admitting: Cardiovascular Disease

## 2024-08-02 NOTE — Telephone Encounter (Signed)
 Pt c/o medication issue:  1. Name of Medication:   sacubitril -valsartan  (ENTRESTO ) 49-51 MG   2. How are you currently taking this medication (dosage and times per day)?   As prescribed  3. Are you having a reaction (difficulty breathing--STAT)?   4. What is your medication issue?   Patient is concerned he had medication changes recently and wants to know if he still needs to be taking this medication.

## 2024-08-02 NOTE — Telephone Encounter (Signed)
 Spoke with pt and advised Entresto  49-51mg  bid is still listed on medication list.  Confirmed with pt Dr Nancey stopped his Metoprolol  and started him on Propranolol.  Pt verbalizes understanding and thanked CHARITY FUNDRAISER for the callback.

## 2024-08-06 ENCOUNTER — Other Ambulatory Visit: Payer: Self-pay | Admitting: Cardiology

## 2024-08-08 ENCOUNTER — Other Ambulatory Visit (HOSPITAL_BASED_OUTPATIENT_CLINIC_OR_DEPARTMENT_OTHER): Payer: Self-pay

## 2024-08-08 MED ORDER — SACUBITRIL-VALSARTAN 49-51 MG PO TABS
1.0000 | ORAL_TABLET | Freq: Two times a day (BID) | ORAL | 1 refills | Status: DC
  Filled 2024-08-08: qty 60, 30d supply, fill #0
  Filled 2024-08-30 – 2024-09-02 (×2): qty 60, 30d supply, fill #1
  Filled 2024-10-07: qty 60, 30d supply, fill #2

## 2024-08-30 ENCOUNTER — Other Ambulatory Visit (HOSPITAL_BASED_OUTPATIENT_CLINIC_OR_DEPARTMENT_OTHER): Payer: Self-pay

## 2024-08-30 ENCOUNTER — Other Ambulatory Visit: Payer: Self-pay

## 2024-09-04 ENCOUNTER — Ambulatory Visit (HOSPITAL_COMMUNITY)
Admission: RE | Admit: 2024-09-04 | Discharge: 2024-09-04 | Disposition: A | Source: Ambulatory Visit | Attending: Cardiology | Admitting: Cardiology

## 2024-09-04 DIAGNOSIS — I5020 Unspecified systolic (congestive) heart failure: Secondary | ICD-10-CM | POA: Diagnosis present

## 2024-09-04 LAB — ECHOCARDIOGRAM COMPLETE
Area-P 1/2: 2.62 cm2
S' Lateral: 3.25 cm

## 2024-09-05 ENCOUNTER — Ambulatory Visit: Payer: Self-pay | Admitting: Cardiovascular Disease

## 2024-10-07 ENCOUNTER — Other Ambulatory Visit (HOSPITAL_BASED_OUTPATIENT_CLINIC_OR_DEPARTMENT_OTHER): Payer: Self-pay

## 2024-10-08 ENCOUNTER — Other Ambulatory Visit (HOSPITAL_BASED_OUTPATIENT_CLINIC_OR_DEPARTMENT_OTHER): Payer: Self-pay

## 2024-10-08 ENCOUNTER — Other Ambulatory Visit: Payer: Self-pay

## 2024-10-08 DIAGNOSIS — Z7689 Persons encountering health services in other specified circumstances: Secondary | ICD-10-CM

## 2024-10-08 MED ORDER — DULOXETINE HCL 40 MG PO CPEP
40.0000 mg | ORAL_CAPSULE | Freq: Every morning | ORAL | 0 refills | Status: AC
  Filled 2024-10-08: qty 30, 30d supply, fill #0

## 2024-10-08 NOTE — Telephone Encounter (Signed)
 Patient identification verified by 2 forms.   Called and spoke to patient  Patient states:  -Has 4 prescriptions to pick up -3 for heart, 1 non cardiac -Was out of work a lot last year -Bills are higher -Anything will help   Plan: Will get message to pt assistance team and social worker.   Patient agrees with plan, no questions at this time

## 2024-10-08 NOTE — Telephone Encounter (Signed)
 Calling to see if he can get a grant or any assistant with getting medication. Please advise

## 2024-10-08 NOTE — Telephone Encounter (Signed)
 H&V Care Navigation CSW Progress Note  Clinical Social Worker received referralregarding assistance with medication costs. 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 Pt does not qualify for SNAP/is not uninsured/is not homeless, so unfortunately we cannot assist with costs of living from Patient Care Fund but will send information about housing cost assistance. Pt not eligible for grants since he has nurse, learning disability. No copay card since Entresto  is generic at this time as well. I have reached out to OP Pharmacy to see what copay costs are, as pt has generic insurance and - likely Entresto  is main concern due to cost now that it is generic. Alternatives should be recommended if possible by provider.   Per pharmacy the other medications cost the following: propranolol  is 3.56, spironolactone  is 0.91, and the duloxetine  is 37.15  Pharmacist shared if you changed the duloxetine  from 40 mg to 30 mg, it would only be 1.91  We are not able to make that change/recommendation. Would need to come from PCP team who manages that medication.   Patient is participating in a Managed Medicaid Plan:  No, BCBS commercial plan  SDOH Screenings   Food Insecurity: Food Insecurity Present (03/29/2024)  Housing: Low Risk (03/29/2024)  Transportation Needs: No Transportation Needs (03/29/2024)  Utilities: Not At Risk (03/29/2024)  Depression (PHQ2-9): Low Risk (12/28/2023)  Financial Resource Strain: High Risk (03/29/2024)  Tobacco Use: Medium Risk (07/16/2024)  Health Literacy: Adequate Health Literacy (03/29/2024)    Marit Lark, MSW, LCSW Clinical Social Worker II [facility]/Vascular Care Navigation  [phone]- work cell phone (preferred)

## 2024-10-10 ENCOUNTER — Other Ambulatory Visit (HOSPITAL_COMMUNITY): Payer: Self-pay

## 2024-10-10 ENCOUNTER — Other Ambulatory Visit (HOSPITAL_BASED_OUTPATIENT_CLINIC_OR_DEPARTMENT_OTHER): Payer: Self-pay

## 2024-10-11 ENCOUNTER — Telehealth: Payer: Self-pay | Admitting: Licensed Clinical Social Worker

## 2024-10-11 ENCOUNTER — Other Ambulatory Visit (HOSPITAL_COMMUNITY): Payer: Self-pay

## 2024-10-11 NOTE — Telephone Encounter (Signed)
 I am okay with using generic Entresto  same dose.  Thanks MJP

## 2024-10-11 NOTE — Telephone Encounter (Signed)
 H&V Care Navigation CSW Progress Note  Clinical Social Worker contacted patient by phone to f/u on concerns related to costs of medications. No answer today at [phone], myChart previously had been sent and read. Left voicemail, will re-attempt again as able.  Patient is participating in a Managed Medicaid Plan:  No, BCBS commercial plan  SDOH Screenings   Food Insecurity: Food Insecurity Present (03/29/2024)  Housing: Low Risk (03/29/2024)  Transportation Needs: No Transportation Needs (03/29/2024)  Utilities: Not At Risk (03/29/2024)  Depression (PHQ2-9): Low Risk (12/28/2023)  Financial Resource Strain: High Risk (03/29/2024)  Tobacco Use: Medium Risk (07/16/2024)  Health Literacy: Adequate Health Literacy (03/29/2024)    Marit Lark, MSW, LCSW Clinical Social Worker II [facility]/Vascular Care Navigation  [phone]- work cell phone (preferred)

## 2024-10-11 NOTE — Telephone Encounter (Signed)
 In that case, recommend changing Entresto  24-26 bid to losartan 25 mg daily. In the past, patient was on lisinopril -hydrochlorothiazide > If he rpefers going back to the same, that is okay with me.  Thanks MJP

## 2024-10-13 NOTE — Progress Notes (Unsigned)
 " Electrophysiology Office Note:    Date:  10/14/2024   ID:  Jeffery [name], DOB 18-Jun-1961, MRN [id number]  PCP:  Joshua Debby CROME, MD   [facility] Providers Cardiologist:  None     Referring MD: Joshua Debby CROME, MD   History of Present Illness:    Jeffery [name] is a 64 y.o. male with a medical history significant for atrial tachycardia, PVCs and NSVT, CHFmrEF, referred for arrhythmia management.      Discussed the use of AI scribe software for clinical note transcription with the patient, who gave verbal consent to proceed.  History of Present Illness  He was diagnosed with frequent PVCs and wore a monitor in January 2025 that showed an 18% burden of PVCs.  He experiences occasional sensations described as 'a little light prick like an electrical charge,' but otherwise is asymptomatic.  He associates the decline in his PVCs with change in his work habits.  When he initially came to medical attention, he was very stressed at work; he had confrontations with a coworker and was actually fired from his job for short period.  EF at the time of his initial evaluation was 40 to 45%.  He experiences shortness of breath during exertion, such as cycling, and finds it difficult to climb hills that he previously managed with ease.   Repeat echo was performed in July 2025 that showed stable EF of 40 to 45%.  Monitor in August showed improvement in PVC burden to approximately 10%.  He is currently taking metoprolol .  He consumes one cup of caffeine daily and drinks up to six beers occasionally. He does not use marijuana or other drugs. He has no unexplained loss of consciousness.    He returns today to follow-up after having echocardiogram performed.     Today, he reports he feels well and has no complaints.   EKGs/Labs/Other Studies Reviewed Today:     Echocardiogram:  TTE April 25, 2024 LVEF 40 to 45%.  Grade 1 diastolic dysfunction.  Mildly enlarged RV.  Mild  MR.  TTE December 2025 LVEF 55 to 60%.  Grade 1 diastolic dysfunction.  Monitors:  6 day monitor May 07, 2024-- my interpretation Sinus rhythm 45 to 119 bpm, average 74 bpm PACs 1.5%, 10.9% PVCs 8 episodes of SVT reported with an average rate of 143 bpm.  Longest duration 4.9 seconds. No symptoms reported.  Stress testing:  Pharmacological stress February 2025 Apical abnormality attributed to diaphragmatic attenuation   EKG:   EKG Interpretation Date/Time:  Monday October 14 2024 11:37:19 EST Ventricular Rate:  62 PR Interval:  202 QRS Duration:  78 QT Interval:  380 QTC Calculation: 385 R Axis:   1  Text Interpretation: Sinus rhythm with occasional Premature ventricular complexes When compared with ECG of 16-Jul-2024 13:51, Premature ventricular complexes are now Present Confirmed by [name] [phone]) on 10/14/2024 12:03:25 PM     Physical Exam:    VS:  BP (!) 126/90 (BP Location: Right Arm, Patient Position: Sitting, Cuff Size: Large)   Pulse 62   Ht 6' 3 (1.905 m)   Wt 200 lb (90.7 kg)   SpO2 98%   BMI 25.00 kg/m     Wt Readings from Last 3 Encounters:  10/14/24 200 lb (90.7 kg)  07/16/24 197 lb (89.4 kg)  12/28/23 193 lb (87.5 kg)     GEN: Well nourished, well developed in no acute distress CARDIAC: RRR, no murmurs, rubs, gallops RESPIRATORY:  Normal work of  breathing MUSCULOSKELETAL: no edema    ASSESSMENT & PLAN:     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  No further intervention needed at this time.  Atrial ectopy Sometimes occurring in  brief runs Minimal symptoms   Follow-up with APP in 1 year   Signed, [name], MD  10/14/2024 12:16 PM    [facility] "

## 2024-10-14 ENCOUNTER — Encounter: Payer: Self-pay | Admitting: Cardiovascular Disease

## 2024-10-14 ENCOUNTER — Ambulatory Visit: Admitting: Cardiovascular Disease

## 2024-10-14 VITALS — BP 126/90 | HR 62 | Ht 75.0 in | Wt 200.0 lb

## 2024-10-14 DIAGNOSIS — I4729 Other ventricular tachycardia: Secondary | ICD-10-CM

## 2024-10-14 DIAGNOSIS — I4719 Other supraventricular tachycardia: Secondary | ICD-10-CM | POA: Diagnosis not present

## 2024-10-14 DIAGNOSIS — I493 Ventricular premature depolarization: Secondary | ICD-10-CM | POA: Diagnosis not present

## 2024-10-14 DIAGNOSIS — I5020 Unspecified systolic (congestive) heart failure: Secondary | ICD-10-CM | POA: Diagnosis not present

## 2024-10-14 DIAGNOSIS — I428 Other cardiomyopathies: Secondary | ICD-10-CM

## 2024-10-14 NOTE — Patient Instructions (Signed)
 Medication Instructions:  Your physician recommends that you continue on your current medications as directed. Please refer to the Current Medication list given to you today.  *If you need a refill on your cardiac medications before your next appointment, please call your pharmacy*  Lab Work: None ordered.  If you have labs (blood work) drawn today and your tests are completely normal, you will receive your results only by: MyChart Message (if you have MyChart) OR A paper copy in the mail If you have any lab test that is abnormal or we need to change your treatment, we will call you to review the results.  Testing/Procedures: None ordered.   Follow-Up: At [facility], you and your health needs are our priority.  As part of our continuing mission to provide you with exceptional heart care, our providers are all part of one team.  This team includes your primary Cardiologist (physician) and Advanced Practice Providers or APPs (Physician Assistants and Nurse Practitioners) who all work together to provide you with the care you need, when you need it.  Your next appointment:   12 month(s)  Provider:   You will see one of the following Advanced Practice Providers on your designated Care Team:   Charlies Arthur, [place] Ozell Jodie Passey, PA-C Suzann Riddle, NP Daphne Barrack, NP Artist Pouch, PA-C    We recommend signing up for the patient portal called MyChart.  Sign up information is provided on this After Visit Summary.  MyChart is used to connect with patients for Virtual Visits (Telemedicine).  Patients are able to view lab/test results, encounter notes, upcoming appointments, etc.  Non-urgent messages can be sent to your provider as well.   To learn more about what you can do with MyChart, go to [email or web address].

## 2024-10-14 NOTE — Progress Notes (Signed)
 H&V Care Navigation CSW Progress Note  Clinical Social Worker met with patient to f/u on concerns previously routed by care team. Re-introduced self, role, reason for visit. Confirmed address and phone number. Spoke with him about recommendations for medication cost concerns. Dr. Elmira has recommended alternative for Entresto  if needed (copay was ~ $51) and we discussed speaking with mental health provider/psychiatry PA about costs of Cymbalta  (pt less concerned as this was about $30). Pt shares he in a good place being back at work, having good supportive therapy and care from [facility] team. We discussed CAFA program if [facility] bills become unmanageable and discussed some additional community resources to assist with food, utility and housing costs. No additional questions at this time. Encouraged him to call as needed and provided additional contact card.   Patient is participating in a Managed Medicaid Plan:  No, BCBS commercial plan  SDOH Screenings   Food Insecurity: Food Insecurity Present (10/14/2024)  Housing: Low Risk (10/14/2024)  Transportation Needs: No Transportation Needs (10/14/2024)  Utilities: Not At Risk (10/14/2024)  Depression (PHQ2-9): Low Risk (12/28/2023)  Financial Resource Strain: Medium Risk (10/14/2024)  Tobacco Use: Medium Risk (10/14/2024)  Health Literacy: Adequate Health Literacy (10/14/2024)    Marit Lark, MSW, LCSW Clinical Social Worker II [facility]/Vascular Care Navigation  [phone]- work cell phone (preferred)

## 2024-10-15 NOTE — Telephone Encounter (Signed)
 Spoke to patient and he reports that he would like to try Losartan 25 mg daily. Pt admits to just picking up Entresto  prescription from the pharmacy, but w ill let us  know when he is ready for the transition.

## 2024-10-22 ENCOUNTER — Other Ambulatory Visit: Payer: Self-pay

## 2024-10-22 ENCOUNTER — Other Ambulatory Visit (HOSPITAL_BASED_OUTPATIENT_CLINIC_OR_DEPARTMENT_OTHER): Payer: Self-pay

## 2024-10-22 MED ORDER — DULOXETINE HCL 20 MG PO CPEP
40.0000 mg | ORAL_CAPSULE | Freq: Every morning | ORAL | 0 refills | Status: AC
  Filled 2024-10-22: qty 60, 30d supply, fill #0
  Filled 2024-11-07: qty 60, 30d supply, fill #1

## 2024-11-05 ENCOUNTER — Telehealth: Payer: Self-pay | Admitting: Cardiology

## 2024-11-05 NOTE — Telephone Encounter (Signed)
.  Pt c/o medication issue:  1. Name of Medication:   sacubitril -valsartan  (ENTRESTO ) 49-51 MG    2. How are you currently taking this medication (dosage and times per day)?    3. Are you having a reaction (difficulty breathing--STAT)? no  4. What is your medication issue? Patient states that the medication is too expensive and it suppose to switch to a different kind of medication for cheaper. Patient is due for refill. Please advise

## 2024-11-05 NOTE — Telephone Encounter (Signed)
 If generic Entresto  also remains too expensive, consider losartan  50 mg daily instead.  Thanks MJP

## 2024-11-06 ENCOUNTER — Telehealth: Payer: Self-pay | Admitting: Pharmacy Technician

## 2024-11-06 ENCOUNTER — Other Ambulatory Visit (HOSPITAL_COMMUNITY): Payer: Self-pay

## 2024-11-06 NOTE — Telephone Encounter (Addendum)
 Per test claims entresto  is 597.68 for 30 days Generic is 84.07 for 30 days   I called the patient and left a message to see if the generic is still too expensive

## 2024-11-07 ENCOUNTER — Other Ambulatory Visit (HOSPITAL_COMMUNITY): Payer: Self-pay

## 2024-11-07 ENCOUNTER — Other Ambulatory Visit (HOSPITAL_BASED_OUTPATIENT_CLINIC_OR_DEPARTMENT_OTHER): Payer: Self-pay

## 2024-11-07 ENCOUNTER — Encounter: Payer: Self-pay | Admitting: Cardiology

## 2024-11-07 ENCOUNTER — Other Ambulatory Visit: Payer: Self-pay

## 2024-11-07 MED ORDER — LOSARTAN POTASSIUM 50 MG PO TABS
50.0000 mg | ORAL_TABLET | Freq: Every day | ORAL | 3 refills | Status: AC
  Filled 2024-11-07: qty 30, 30d supply, fill #0

## 2024-11-07 NOTE — Telephone Encounter (Signed)
 Entresto  dc and Losartan  order placed and sent to requested pharmacy.

## 2024-11-07 NOTE — Addendum Note (Signed)
 Addended by: JOSHUA ANDREZ PARAS on: 11/07/2024 11:43 AM   Modules accepted: Orders

## 2024-11-07 NOTE — Telephone Encounter (Signed)
 Patient would like losartan . Sent message in other encounter

## 2024-11-07 NOTE — Telephone Encounter (Signed)
 Hi the patient called back and is asking to be changed to losartan  please. If the losartan  can be sent to the [facility]. Thank you!

## 2024-11-07 NOTE — Telephone Encounter (Signed)
.  I called the patient and left another message.
